# Patient Record
Sex: Female | Born: 1965
Health system: Southern US, Community
[De-identification: ages and names within clinical notes are randomized; demographics above are authoritative.]

## PROBLEM LIST (undated history)

## (undated) DIAGNOSIS — I10 Essential (primary) hypertension: Secondary | ICD-10-CM

## (undated) DIAGNOSIS — N1330 Unspecified hydronephrosis: Secondary | ICD-10-CM

## (undated) DIAGNOSIS — T7840XA Allergy, unspecified, initial encounter: Secondary | ICD-10-CM

## (undated) DIAGNOSIS — D700 Congenital agranulocytosis: Secondary | ICD-10-CM

## (undated) DIAGNOSIS — I77811 Abdominal aortic ectasia: Secondary | ICD-10-CM

## (undated) DIAGNOSIS — N189 Chronic kidney disease, unspecified: Secondary | ICD-10-CM

## (undated) DIAGNOSIS — J302 Other seasonal allergic rhinitis: Secondary | ICD-10-CM

## (undated) DIAGNOSIS — C801 Malignant (primary) neoplasm, unspecified: Secondary | ICD-10-CM

## (undated) DIAGNOSIS — Z973 Presence of spectacles and contact lenses: Secondary | ICD-10-CM

## (undated) DIAGNOSIS — E041 Nontoxic single thyroid nodule: Secondary | ICD-10-CM

## (undated) DIAGNOSIS — Z86018 Personal history of other benign neoplasm: Secondary | ICD-10-CM

## (undated) DIAGNOSIS — K760 Fatty (change of) liver, not elsewhere classified: Secondary | ICD-10-CM

## (undated) DIAGNOSIS — K824 Cholesterolosis of gallbladder: Secondary | ICD-10-CM

## (undated) DIAGNOSIS — E611 Iron deficiency: Secondary | ICD-10-CM

## (undated) DIAGNOSIS — N183 Chronic kidney disease, stage 3 unspecified: Secondary | ICD-10-CM

## (undated) DIAGNOSIS — D259 Leiomyoma of uterus, unspecified: Secondary | ICD-10-CM

## (undated) DIAGNOSIS — C4499 Other specified malignant neoplasm of skin, unspecified: Secondary | ICD-10-CM

## (undated) HISTORY — DX: Cholesterolosis of gallbladder: K82.4

## (undated) HISTORY — DX: Fatty (change of) liver, not elsewhere classified: K76.0

## (undated) HISTORY — PX: ABDOMINAL HYSTERECTOMY: SHX81

## (undated) HISTORY — DX: Essential (primary) hypertension: I10

## (undated) HISTORY — PX: MASS EXCISION: SHX2000

## (undated) HISTORY — DX: Allergy, unspecified, initial encounter: T78.40XA

## (undated) HISTORY — DX: Leiomyoma of uterus, unspecified: D25.9

## (undated) HISTORY — DX: Other seasonal allergic rhinitis: J30.2

## (undated) HISTORY — DX: Iron deficiency: E61.1

## (undated) HISTORY — DX: Other specified malignant neoplasm of skin, unspecified: C44.99

## (undated) HISTORY — DX: Abdominal aortic ectasia: I77.811

---

## 1995-05-05 HISTORY — PX: TUBAL LIGATION: SHX77

## 1998-05-20 ENCOUNTER — Other Ambulatory Visit: Admission: RE | Admit: 1998-05-20 | Discharge: 1998-05-20 | Payer: Self-pay | Admitting: Gynecology

## 2000-01-12 ENCOUNTER — Other Ambulatory Visit: Admission: RE | Admit: 2000-01-12 | Discharge: 2000-01-12 | Payer: Self-pay | Admitting: Obstetrics and Gynecology

## 2000-11-01 ENCOUNTER — Encounter: Payer: Self-pay | Admitting: Family Medicine

## 2000-11-01 ENCOUNTER — Ambulatory Visit (HOSPITAL_COMMUNITY): Admission: RE | Admit: 2000-11-01 | Discharge: 2000-11-01 | Payer: Self-pay | Admitting: Family Medicine

## 2001-10-28 ENCOUNTER — Other Ambulatory Visit: Admission: RE | Admit: 2001-10-28 | Discharge: 2001-10-28 | Payer: Self-pay | Admitting: Obstetrics and Gynecology

## 2004-10-20 ENCOUNTER — Other Ambulatory Visit: Admission: RE | Admit: 2004-10-20 | Discharge: 2004-10-20 | Payer: Self-pay | Admitting: Obstetrics and Gynecology

## 2007-01-28 ENCOUNTER — Ambulatory Visit (HOSPITAL_COMMUNITY): Admission: RE | Admit: 2007-01-28 | Discharge: 2007-01-28 | Payer: Self-pay | Admitting: Obstetrics and Gynecology

## 2007-01-28 ENCOUNTER — Encounter (INDEPENDENT_AMBULATORY_CARE_PROVIDER_SITE_OTHER): Payer: Self-pay | Admitting: Obstetrics and Gynecology

## 2007-01-28 HISTORY — PX: HYSTEROSCOPY WITH THERMACHOICE: SHX5396

## 2008-05-04 HISTORY — PX: ENDOMETRIAL ABLATION: SHX621

## 2009-03-04 LAB — CONVERTED CEMR LAB

## 2009-10-18 DIAGNOSIS — N840 Polyp of corpus uteri: Secondary | ICD-10-CM | POA: Insufficient documentation

## 2009-10-18 DIAGNOSIS — D649 Anemia, unspecified: Secondary | ICD-10-CM | POA: Insufficient documentation

## 2009-10-18 DIAGNOSIS — Z90710 Acquired absence of both cervix and uterus: Secondary | ICD-10-CM | POA: Insufficient documentation

## 2010-05-13 ENCOUNTER — Encounter: Payer: Self-pay | Admitting: Internal Medicine

## 2010-05-13 ENCOUNTER — Ambulatory Visit: Admission: RE | Admit: 2010-05-13 | Payer: Self-pay | Source: Home / Self Care | Admitting: Internal Medicine

## 2010-06-05 NOTE — Assessment & Plan Note (Signed)
Summary: new bcbs pt--#--pkg--stc   Vital Signs:  Patient profile:   45 year old female Height:      66 inches (167.64 cm) Weight:      198.0 pounds (90.00 kg) BMI:     32.07 O2 Sat:      99 % on Room air Temp:     98.4 degrees F (36.89 degrees C) oral Pulse rate:   71 / minute BP sitting:   134 / 98  (left arm) Cuff size:   large  Vitals Entered By: Orlan Leavens RMA (May 13, 2010 8:22 AM)  O2 Flow:  Room air CC: New patient Is Patient Diabetic? No Pain Assessment Patient in pain? no        Primary Care Provider:  Newt Lukes MD  CC:  New patient.  History of Present Illness: pt left before being seen (doctor in meeting)  Preventive Screening-Counseling & Management  Alcohol-Tobacco     Smoking Status: never  Current Medications (verified): 1)  None  Allergies (verified): No Known Drug Allergies  Social History: Never Smoked Smoking Status:  never   Other Orders: No Charge Patient Arrived (NCPA0) (NCPA0)   Orders Added: 1)  No Charge Patient Arrived (NCPA0) [NCPA0]     Mammogram  Procedure date:  03/04/2009  Findings:      No specific mammographic evidence of malignancy.    Pap Smear  Procedure date:  03/04/2009  Findings:      Interpretation Result:Negative for intraepithelial Lesion or Malignancy.

## 2010-08-15 ENCOUNTER — Other Ambulatory Visit: Payer: Self-pay | Admitting: Obstetrics and Gynecology

## 2010-08-15 DIAGNOSIS — R928 Other abnormal and inconclusive findings on diagnostic imaging of breast: Secondary | ICD-10-CM

## 2010-08-22 ENCOUNTER — Ambulatory Visit
Admission: RE | Admit: 2010-08-22 | Discharge: 2010-08-22 | Disposition: A | Discharge status: Home / Self Care | Payer: Federal, State, Local not specified - PPO | Source: Ambulatory Visit | Attending: Obstetrics and Gynecology | Admitting: Obstetrics and Gynecology

## 2010-08-22 DIAGNOSIS — R928 Other abnormal and inconclusive findings on diagnostic imaging of breast: Secondary | ICD-10-CM

## 2010-09-16 NOTE — H&P (Signed)
[name], [name]               ACCOUNT NO.:  [id number]   MEDICAL RECORD NO.:  [id number]          PATIENT TYPE:  AMB   LOCATION:  SDC                           FACILITY:  [facility]   PHYSICIAN:  Duke Salvia. Marcelle Overlie, M.D.DATE OF BIRTH:  1965-10-12   DATE OF ADMISSION:  DATE OF DISCHARGE:                              HISTORY & PHYSICAL   CHIEF COMPLAINT:  Menorrhagia with anemia.   HISTORY OF PRESENT ILLNESS:  A 45 year old G4 P4 prior tubal with a  several year history of anemia and menorrhagia.  Recent SHG in our  office showed multiple fibroids intramural 2.1, 1.2 and 3, and on saline  infusion she had a significant polyp noted in the endometrial cavity.  She presents now for [facility] hysteroscopy with NovaSure EMA.  This procedure  including risk of bleeding, infection, other complications that may  require open or additional surgery were reviewed.  The 90%  amenorrhea/hypomenorrhea rate status post NovaSure reviewed with her  along with her expected recovery time.   CURRENT MEDICATIONS:  OCP for bleeding suppression along with iron  daily.   PAST MEDICAL HISTORY:   ALLERGIES:  None.   OPERATION:  Tubal.   FAMILY HISTORY:  Significant for mother and father with diabetes and  hypertension.   PHYSICAL EXAMINATION:  VITAL SIGNS:  Temperature 98.2, blood pressure  120/78.  HEENT: Unremarkable.  NECK:  Supple without masses.  Lungs: Clear.  CARDIOVASCULAR: Regular rate and rhythm without murmurs, rubs, gallops.  BREASTS: Without masses.  ABDOMEN:  Soft, flat and nontender.  PELVIC:  Normal external genitalia. Vagina and cervix clear.  Uterus  midposition, upper limits normal size.  Adnexa negative.  EXTREMITIES/NEUROLOGIC:  Unremarkable.   IMPRESSION:  1. Menorrhagia.  2. Small leiomyoma.  3. Endometrial polyp.   PLAN:  D&C hysteroscopy with NovaSure EMA.  Procedure and risks reviewed  as above.      Richard M. Marcelle Overlie, M.D.  Electronically Signed     RMH/MEDQ  D:   01/25/2007  T:  01/25/2007  Job:  [id number]

## 2010-09-16 NOTE — Op Note (Signed)
[name], [name]               ACCOUNT NO.:  [id number]   MEDICAL RECORD NO.:  [id number]          PATIENT TYPE:  AMB   LOCATION:  SDC                           [facility]ILITY:  [facility]   PHYSICIAN:  Duke Salvia. Marcelle Overlie, M.D.DATE OF BIRTH:  12/03/1965   DATE OF PROCEDURE:  01/28/2007  DATE OF DISCHARGE:                               OPERATIVE REPORT   PREOPERATIVE DIAGNOSES:  1. Menorrhagia with anemia.  2. Leiomyoma.  3. Endometrial polyp.   POSTOPERATIVE DIAGNOSES:  1. Menorrhagia with anemia.  2. Leiomyoma.  3. Endometrial polyp.   PROCEDURE:  Diagnostic hysteroscopy with removal of endometrial polyp,  attempted NovaSure endometrial ablation followed by ThermaChoice  endometrial ablation.   SURGEON:  Duke Salvia. Marcelle Overlie, M.D.   ANESTHESIA:  General tracheal.   COMPLICATIONS:  None.   DRAINS:  Foley catheter.   BLOOD LOSS:  Minimal.   SPECIMENS REMOVED:  Endometrial curettings, plus endometrial polyp.   PROCEDURE AND FINDINGS:  The patient was taken to the operating room.  After an adequate level of general endotracheal anesthesia was obtained  with the patient legs in stirrups, perineum and vagina were prepped and  draped in usual manner for vaginal procedures.  In-and-out catheter used  to drain the bladder.  EUA carried out at that point.  Uterus was 10-11  weeks size, symmetrically enlarged.  Speculum was positioned.  Cervix  grasped with tenaculum.  Paracervical block created by infiltrating at 3  and 9 o'clock submucosally, 5-7 mL of 1% Xylocaine on either side after  negative aspiration for mainly postoperative analgesia.  The uterus was  then sounded to 10-11 cm, progressively dilated to a 29 Pratt.  The 7-mm  continuous flow hysteroscope was then inserted.  A well-defined  endometrial polyp was noted at the cavity.  Polyp forceps was used to  explore the cavity revealing a large portion of polyp.  Sharp curettage  was then carried out, all submitted to pathology.   The scope was  reinserted.  The cavity was irrigated.  The polyp was noted to be gone.  The uterus sounded to 10 cm on recheck with a cervical length of 3.0.  The NovaSure procedure was started at that point.  I could not get the  array to engage primarily due to the width and the fact that there was  some irregularity to the cavity at the fundus.  Two attempts were made,  could not get the array to engaged properly.  The NovaSure was  terminated at that point.  Repeat hysteroscopy revealed the cavity to be  unremarkable.  There was no evidence of perforation.  The ThermaChoice  was then positioned.  Pressure  stabilized at approximately 170, and the 8-minute treatment cycle was  carried out without difficulty.  She tolerated this well.  Instruments  were removed.  She went to recovery room in good condition.  She did  receive preoperative Ancef 1 gram IV and during the case received IV  Toradol.      Richard M. Marcelle Overlie, M.D.  Electronically Signed     RMH/MEDQ  D:  01/28/2007  T:  01/28/2007  Job:  [id number]

## 2011-02-12 LAB — CBC
Hemoglobin: 10.4 — ABNORMAL LOW
Platelets: 322
RDW: 23.1 — ABNORMAL HIGH

## 2011-03-05 ENCOUNTER — Encounter: Payer: Self-pay | Admitting: Family Medicine

## 2011-03-05 ENCOUNTER — Ambulatory Visit (INDEPENDENT_AMBULATORY_CARE_PROVIDER_SITE_OTHER): Payer: Federal, State, Local not specified - PPO | Admitting: Family Medicine

## 2011-03-05 VITALS — BP 126/82 | HR 72 | Temp 99.1°F | Ht 66.0 in | Wt 199.0 lb

## 2011-03-05 DIAGNOSIS — Z833 Family history of diabetes mellitus: Secondary | ICD-10-CM

## 2011-03-05 DIAGNOSIS — R209 Unspecified disturbances of skin sensation: Secondary | ICD-10-CM

## 2011-03-05 DIAGNOSIS — D259 Leiomyoma of uterus, unspecified: Secondary | ICD-10-CM

## 2011-03-05 DIAGNOSIS — E669 Obesity, unspecified: Secondary | ICD-10-CM

## 2011-03-05 DIAGNOSIS — D649 Anemia, unspecified: Secondary | ICD-10-CM

## 2011-03-05 DIAGNOSIS — R200 Anesthesia of skin: Secondary | ICD-10-CM | POA: Insufficient documentation

## 2011-03-05 NOTE — Progress Notes (Signed)
Subjective:    Patient ID: [name], female    DOB: [birth year], 45 y.o.   MRN: [id number]  HPI CC: new pt  Presents with husband.  Having trouble with circulation, worse at night.  Even when sitting starts noticing fingers going numb.  This has been going on for 1 wk, has happened in past.  All tips of fingers.  Normally lays on left side.  No pain in arms.  No burning or tingling, just mild numbness.  This wakes her up.  Noticing slight headache.  Also thinks may have low iron.  Takes one daily, in am prior to breakfast.  H/o low iron.  Started on iron by OBGYN.  Low iron runs in family.  Preventative: Well woman at [facility] - early this year. Normal mammo, pap smear this year. No recent cholesterol/sugar check. Unsure tetanus.  Declines flu.  Medications and allergies reviewed and updated in chart.  Past histories reviewed and updated if relevant as below. Patient Active Problem List  Diagnoses  . LEIOMYOMA, UTERUS  . ANEMIA-NOS  . ENDOMETRIAL POLYP   Past Medical History  Diagnosis Date  . Iron deficiency   . Pre-hypertension   . Seasonal allergies    Past Surgical History  Procedure Date  . Tubal ligation 1997  . Endometrial ablation 2010    fibroids   History  Substance Use Topics  . Smoking status: Never Smoker   . Smokeless tobacco: Never Used  . Alcohol Use: No   Family History  Problem Relation Age of Onset  . Diabetes Mother   . Hypertension Mother   . Diabetes Father   . Hypertension Father   . Cancer Paternal Aunt 59  . Stroke Neg Hx   . Coronary artery disease Neg Hx    No Known Allergies No current outpatient prescriptions on file prior to visit.   Review of Systems  Constitutional: Negative for fever, chills, activity change, appetite change, fatigue and unexpected weight change.  HENT: Negative for hearing loss and neck pain.   Eyes: Negative for visual disturbance.  Respiratory: Negative for cough, chest tightness, shortness of breath  and wheezing.   Cardiovascular: Positive for chest pain (dull ache on right side, not pressure/tightness, not exhertional or relieved by rest). Negative for palpitations and leg swelling.  Gastrointestinal: Negative for nausea, vomiting, abdominal pain, diarrhea, constipation, blood in stool and abdominal distention.  Genitourinary: Negative for hematuria and difficulty urinating.  Musculoskeletal: Negative for myalgias and arthralgias.  Skin: Negative for rash.  Neurological: Positive for headaches (occasional). Negative for dizziness, seizures and syncope.  Hematological: Does not bruise/bleed easily.  Psychiatric/Behavioral: Negative for dysphoric mood. The patient is not nervous/anxious.       Objective:   Physical Exam  Nursing note and vitals reviewed. Constitutional: She is oriented to person, place, and time. She appears well-developed and well-nourished. No distress.  HENT:  Head: Normocephalic and atraumatic.  Right Ear: Hearing, tympanic membrane, external ear and ear canal normal.  Left Ear: Hearing, tympanic membrane, external ear and ear canal normal.  Nose: Nose normal. No mucosal edema or rhinorrhea.  Mouth/Throat: Uvula is midline, oropharynx is clear and moist and mucous membranes are normal. No oropharyngeal exudate, posterior oropharyngeal edema, posterior oropharyngeal erythema or tonsillar abscesses.  Eyes: Conjunctivae and EOM are normal. Pupils are equal, round, and reactive to light. No scleral icterus.  Neck: Normal range of motion. Neck supple. Carotid bruit is not present. No thyromegaly present.  Cardiovascular: Normal rate, regular rhythm, normal  heart sounds and intact distal pulses.   No murmur heard. Pulses:      Radial pulses are 2+ on the right side, and 2+ on the left side.  Pulmonary/Chest: Effort normal and breath sounds normal. No respiratory distress. She has no wheezes. She has no rales.  Abdominal: Soft. Bowel sounds are normal. She exhibits no  distension and no mass. There is no tenderness. There is no rebound and no guarding.  Musculoskeletal: Normal range of motion. She exhibits no edema.  Lymphadenopathy:    She has no cervical adenopathy.  Neurological: She is alert and oriented to person, place, and time. Coordination normal.       CN grossly intact, station and gait intact Temperature sensation intact, no numbness currently, no weakness, grip strength intact, intact 2 point discrimination and differentiation between hard/soft  Skin: Skin is warm and dry. No rash noted.  Psychiatric: She has a normal mood and affect. Her behavior is normal. Judgment and thought content normal.      Assessment & Plan:

## 2011-03-05 NOTE — Assessment & Plan Note (Signed)
S/p ablation, no problems since.

## 2011-03-05 NOTE — Patient Instructions (Addendum)
Return at your convenience fasting for blood work. For circulation - we will check blood work. We will check sugar, cholesterol, as well as vitamin levels and iron. Good to meet you today.  Call us with questions.

## 2011-03-05 NOTE — Assessment & Plan Note (Signed)
Tips of fingers.  Not consistent with CTS or with cervical myelopathy. Doubt circulation issue - no LE sxs, good pulses on exam today. ?compression neuropathy from sleeping at night. Check blood work to r/o deficiency as cause of this. Update if worsening.

## 2011-03-05 NOTE — Assessment & Plan Note (Signed)
Check CBC and iron panel when returns.

## 2011-03-06 ENCOUNTER — Other Ambulatory Visit (INDEPENDENT_AMBULATORY_CARE_PROVIDER_SITE_OTHER): Payer: Federal, State, Local not specified - PPO

## 2011-03-06 DIAGNOSIS — E669 Obesity, unspecified: Secondary | ICD-10-CM

## 2011-03-06 DIAGNOSIS — R200 Anesthesia of skin: Secondary | ICD-10-CM

## 2011-03-06 DIAGNOSIS — Z833 Family history of diabetes mellitus: Secondary | ICD-10-CM

## 2011-03-06 DIAGNOSIS — R209 Unspecified disturbances of skin sensation: Secondary | ICD-10-CM

## 2011-03-06 DIAGNOSIS — D649 Anemia, unspecified: Secondary | ICD-10-CM

## 2011-03-06 LAB — COMPREHENSIVE METABOLIC PANEL
AST: 23 U/L (ref 0–37)
Albumin: 4.3 g/dL (ref 3.5–5.2)
Alkaline Phosphatase: 38 U/L — ABNORMAL LOW (ref 39–117)
Glucose, Bld: 88 mg/dL (ref 70–99)
Potassium: 3.6 mEq/L (ref 3.5–5.1)
Sodium: 138 mEq/L (ref 135–145)
Total Protein: 7.8 g/dL (ref 6.0–8.3)

## 2011-03-06 LAB — LIPID PANEL
HDL: 61.5 mg/dL (ref >39.00)
LDL Cholesterol: 99 mg/dL (ref 0–99)
Total CHOL/HDL Ratio: 3
VLDL: 31.8 mg/dL (ref 0.0–40.0)

## 2011-03-06 LAB — CBC WITH DIFFERENTIAL/PLATELET
Eosinophils Absolute: 0.1 10*3/uL (ref 0.0–0.7)
Eosinophils Relative: 2.3 % (ref 0.0–5.0)
Lymphocytes Relative: 54.9 % — ABNORMAL HIGH (ref 12.0–46.0)
MCV: 91.2 fl (ref 78.0–100.0)
Monocytes Absolute: 0.3 10*3/uL (ref 0.1–1.0)
Neutrophils Relative %: 32.2 % — ABNORMAL LOW (ref 43.0–77.0)
Platelets: 238 10*3/uL (ref 150.0–400.0)
WBC: 3.5 10*3/uL — ABNORMAL LOW (ref 4.5–10.5)

## 2011-03-06 LAB — IBC PANEL
Iron: 92 ug/dL (ref 42–145)
Saturation Ratios: 24.8 % (ref 20.0–50.0)
Transferrin: 265.3 mg/dL (ref 212.0–360.0)

## 2011-03-06 LAB — FERRITIN: Ferritin: 31.9 ng/mL (ref 10.0–291.0)

## 2011-03-07 LAB — VITAMIN D 25 HYDROXY (VIT D DEFICIENCY, FRACTURES): Vit D, 25-Hydroxy: 14 ng/mL — ABNORMAL LOW (ref 30–89)

## 2011-03-08 ENCOUNTER — Telehealth: Payer: Self-pay | Admitting: Family Medicine

## 2011-03-08 MED ORDER — VITAMIN D (ERGOCALCIFEROL) 1.25 MG (50000 UNIT) PO CAPS: 50000.0000 [IU] | ORAL_CAPSULE | ORAL | Status: DC

## 2011-03-08 NOTE — Telephone Encounter (Signed)
Please notify vitamin B12, folate, iron, thyroid normal.  Kidneys, liver, sugar normal. Vitamin D was low- recommend weekly supplementation 50,000 u once weekly (Sent in) for 8 weeks then 1000IU daily. Cholesterol levels overall good. Not anemic. White count was a bit low - would want to monitor this with recheck in 3-4 months.

## 2011-03-09 NOTE — Telephone Encounter (Signed)
Message left for patient to return my call.  

## 2011-03-10 NOTE — Telephone Encounter (Signed)
Patient notified. She will start vitamin d. Repeat labs scheduled.

## 2011-04-10 ENCOUNTER — Ambulatory Visit (INDEPENDENT_AMBULATORY_CARE_PROVIDER_SITE_OTHER): Payer: Federal, State, Local not specified - PPO | Admitting: Internal Medicine

## 2011-04-10 ENCOUNTER — Encounter: Payer: Self-pay | Admitting: Internal Medicine

## 2011-04-10 VITALS — BP 166/96 | HR 86 | Temp 98.5°F | Wt 203.0 lb

## 2011-04-10 DIAGNOSIS — H6691 Otitis media, unspecified, right ear: Secondary | ICD-10-CM

## 2011-04-10 DIAGNOSIS — H6690 Otitis media, unspecified, unspecified ear: Secondary | ICD-10-CM

## 2011-04-10 MED ORDER — AMOXICILLIN 500 MG PO TABS: 1000.0000 mg | ORAL_TABLET | Freq: Two times a day (BID) | ORAL | Status: AC

## 2011-04-10 MED ORDER — ANTIPYRINE-BENZOCAINE 5.4-1.4 % OT SOLN: 3.0000 [drp] | OTIC | Status: AC | PRN

## 2011-04-10 NOTE — Assessment & Plan Note (Signed)
Mild Could be related to sinus issues Not clearly bacterial but she is anxious for Rx with worsening status Will give amoxil and auralgan for pain

## 2011-04-10 NOTE — Progress Notes (Signed)
  Subjective:    Patient ID: [name], female    DOB: [birth year], 45 y.o.   MRN: [id number]  HPI Having pressure and aching in right ear since last night Has had congestion all week Feels sick No fever Lots of drainage with some cough---occ brings up greenish mucus No SOB No sore throat  Tried alka seltzer plus Zyrtec and robitussin No clear help  No current outpatient prescriptions on file prior to visit.    No Known Allergies  Past Medical History  Diagnosis Date  . Iron deficiency   . Pre-hypertension   . Seasonal allergies     Past Surgical History  Procedure Date  . Tubal ligation 1997  . Endometrial ablation 2010    fibroids    Family History  Problem Relation Age of Onset  . Diabetes Mother   . Hypertension Mother   . Diabetes Father   . Hypertension Father   . Cancer Paternal Aunt 54  . Stroke Neg Hx   . Coronary artery disease Neg Hx     History   Social History  . Marital Status: Married    Spouse Name: N/A    Number of Children: N/A  . Years of Education: N/A   Occupational History  . Not on file.   Social History Main Topics  . Smoking status: Never Smoker   . Smokeless tobacco: Never Used  . Alcohol Use: No  . Drug Use: No  . Sexually Active: Not on file   Other Topics Concern  . Not on file   Social History Narrative   Caffeine: 1 cup coffee/dayLives with husband, 3 sonsOccupation: works for [employer]: works out 3x/wk at [employer]: high sugar, good fruits and vegetables, some water   Review of Systems No rash No vomiting or diarrhea Eating okay Has seasonal allergies--not usually this time of year    Objective:   Physical Exam  Constitutional: She appears well-developed and well-nourished. No distress.  HENT:  Mouth/Throat: Oropharynx is clear and moist. No oropharyngeal exudate.       No sinus tenderness Moderate nasal inflammation Right TM has superior inflammation --mild Left TM pretty normal  Neck: Normal  range of motion. Neck supple.  Pulmonary/Chest: Effort normal and breath sounds normal. No respiratory distress. She has no wheezes. She has no rales.  Lymphadenopathy:    She has no cervical adenopathy.          Assessment & Plan:

## 2011-06-02 ENCOUNTER — Other Ambulatory Visit: Payer: Self-pay | Admitting: Family Medicine

## 2011-06-02 DIAGNOSIS — R7989 Other specified abnormal findings of blood chemistry: Secondary | ICD-10-CM

## 2011-06-09 ENCOUNTER — Other Ambulatory Visit: Payer: Federal, State, Local not specified - PPO

## 2011-06-15 ENCOUNTER — Other Ambulatory Visit: Payer: Federal, State, Local not specified - PPO

## 2012-05-04 DIAGNOSIS — C4499 Other specified malignant neoplasm of skin, unspecified: Secondary | ICD-10-CM

## 2012-05-04 HISTORY — DX: Other specified malignant neoplasm of skin, unspecified: C44.99

## 2012-05-31 ENCOUNTER — Encounter (INDEPENDENT_AMBULATORY_CARE_PROVIDER_SITE_OTHER): Payer: Self-pay | Admitting: Surgery

## 2012-05-31 ENCOUNTER — Ambulatory Visit (INDEPENDENT_AMBULATORY_CARE_PROVIDER_SITE_OTHER): Payer: Federal, State, Local not specified - PPO | Admitting: Surgery

## 2012-05-31 VITALS — BP 132/72 | HR 87 | Temp 98.2°F | Resp 18 | Ht 66.0 in | Wt 212.0 lb

## 2012-05-31 DIAGNOSIS — D492 Neoplasm of unspecified behavior of bone, soft tissue, and skin: Secondary | ICD-10-CM

## 2012-05-31 NOTE — Patient Instructions (Signed)
  [facility] -- DISCHARGE INSTRUCTIONS  REMINDER:   Carry a list of your medications and allergies with you at all times  Call your pharmacy at least 1 week in advance to refill prescriptions  Do not mix any prescribed pain medicine with alcohol  Do not drive any motor vehicles while taking pain medication  Take medications with food unless otherwise directed  Follow-up appointments (date to return to physician): Please call [phone] to confirm your follow up appointment with your surgeon.  Call your Surgeon if you have:  Temperature greater than 101.0  Persistent nausea and vomiting  Severe uncontrolled pain  Redness, tenderness, or signs of infection (pain, swelling, redness, odor or    green/yellow discharge around the site)  Difficulty breathing, headache or visual disturbances  Hives  Persistent dizziness or light-headedness  Any other questions or concerns you may have after discharge  In an emergency, call 911 or go to an Emergency Department at a nearby hospital.  Diet: Begin with liquids, and if they are tolerated, resume your usual diet.  Avoid spicy, greasy or heavy foods.  If you have nausea or vomiting, go back to liquids.  If you cannot keep liquids down, call your doctor.  Avoid alcohol consumption while on prescription pain medications. Good nutrition promotes healing. Increase fiber and fluids.   ADDITIONAL INSTRUCTIONS:   Zyeir Dymek M. Celia Friedland, MD, FACS [facility] Office: [phone] 

## 2012-05-31 NOTE — Progress Notes (Signed)
General Surgery [facility]  Chief Complaint  Patient presents with  . New Evaluation    evaluate keloid - referral from Dr. June Leap    HISTORY: The patient is a 47 year old black female who presents on referral from her dermatologist with an enlarging soft tissue mass on the pubis. This has been present for several months. Over the past 2 months there has been mark and enlargement. Patient was concern that this represented a keloid. She denies any signs or symptoms of infection. She denies any previous surgical incisions in this region. She does note mild to moderate discomfort. She presents today for evaluation for surgical excision.  Past Medical History  Diagnosis Date  . Iron deficiency   . Pre-hypertension   . Seasonal allergies      No current outpatient prescriptions on file.     No Known Allergies   Family History  Problem Relation Age of Onset  . Diabetes Mother   . Hypertension Mother   . Diabetes Father   . Hypertension Father   . Cancer Paternal Aunt 66  . Stroke Neg Hx   . Coronary artery disease Neg Hx      History   Social History  . Marital Status: Married    Spouse Name: N/A    Number of Children: N/A  . Years of Education: N/A   Social History Main Topics  . Smoking status: Never Smoker   . Smokeless tobacco: Never Used  . Alcohol Use: No  . Drug Use: No  . Sexually Active: None   Other Topics Concern  . None   Social History Narrative   Caffeine: 1 cup coffee/dayLives with husband, 3 sonsOccupation: works for [employer]: works out 3x/wk at [employer]: high sugar, good fruits and vegetables, some water     REVIEW OF SYSTEMS - PERTINENT POSITIVES ONLY: Denies any signs or symptoms of infection. No history of drainage. No history of previous wound or surgical incision. No areas of hidradenitis in the groin, axillae, or beneath the breast.  EXAM: Filed Vitals:   05/31/12 1409  BP: 132/72  Pulse: 87    Temp: 98.2 F (36.8 C)  Resp: 18    HEENT: normocephalic; pupils equal and reactive; sclerae clear; dentition good; mucous membranes moist NECK:  symmetric on extension; no palpable anterior or posterior cervical lymphadenopathy; no supraclavicular masses; no tenderness CHEST: clear to auscultation bilaterally without rales, rhonchi, or wheezes CARDIAC: regular rate and rhythm without significant murmur; peripheral pulses are full ABDOMEN: soft without distension; bowel sounds present; no mass; no hepatosplenomegaly; no hernia GU:  Large protruding soft tissue mass with multiple lobules extending over an area measuring 9 x 6 x 5 cm centered on the upper mons pubis. No ulceration. Mild tenderness. No fluctuance. EXT:  non-tender without edema; no deformity NEURO: no gross focal deficits; no sign of tremor   LABORATORY RESULTS: See [facility] (CHL-Epic) for most recent results   RADIOLOGY RESULTS: See [facility] (CHL-Epic) for most recent results   IMPRESSION: Soft tissue neoplasm involving skin and subcutaneous tissues on mons pubis, 9 x 6 x 5 cm  PLAN: I discussed the findings with the patient and her husband. This may represent keloid. However I am concerned about the possibility of a soft tissue neoplasm given the rapid growth. This could also represent a very at of hidradenitis with inflammatory changes.  I think the safest and most expeditious course of action would be surgical excision. We can perform this as  an outpatient procedure. We will close the wound in layers. We discussed the extent of the surgical incision. We will send the tissue for pathologic diagnosis. If this is a keloid, I discussed the risk of recurrence. Patient and her husband understand wished to proceed.  The risks and benefits of the procedure have been discussed at length with the patient.  The patient understands the proposed procedure, potential alternative treatments, and the course of  recovery to be expected.  All of the patient's questions have been answered at this time.  The patient wishes to proceed with surgery.  Velora Heckler, MD, FACS General & Endocrine Surgery [facility]   Visit Diagnoses: 1. Neoplasm of soft tissue, pubis, skin and subcutaneous tissue     Primary Care Physician: Eustaquio Boyden, MD

## 2012-06-04 DIAGNOSIS — Z9889 Other specified postprocedural states: Secondary | ICD-10-CM

## 2012-06-04 HISTORY — DX: Other specified postprocedural states: Z98.890

## 2012-06-10 ENCOUNTER — Other Ambulatory Visit (INDEPENDENT_AMBULATORY_CARE_PROVIDER_SITE_OTHER): Payer: Self-pay | Admitting: Surgery

## 2012-06-10 DIAGNOSIS — C44509 Unspecified malignant neoplasm of skin of other part of trunk: Secondary | ICD-10-CM

## 2012-06-10 DIAGNOSIS — C44599 Other specified malignant neoplasm of skin of other part of trunk: Secondary | ICD-10-CM

## 2012-06-10 HISTORY — PX: SOFT TISSUE MASS EXCISION: SHX2419

## 2012-06-13 ENCOUNTER — Telehealth (INDEPENDENT_AMBULATORY_CARE_PROVIDER_SITE_OTHER): Payer: Self-pay

## 2012-06-13 NOTE — Telephone Encounter (Signed)
LMOM for pt to call and confirm appt

## 2012-06-13 NOTE — Telephone Encounter (Signed)
The pt called back and confirmed the appointment.  She has stitches.  I gave her the appointment after confirming with [facility].  She also states she has a little bit of blood.  I told her that is ok and to keep it covered with gauze or pad.

## 2012-06-21 ENCOUNTER — Ambulatory Visit (INDEPENDENT_AMBULATORY_CARE_PROVIDER_SITE_OTHER): Payer: Federal, State, Local not specified - PPO | Admitting: Surgery

## 2012-06-21 ENCOUNTER — Encounter (INDEPENDENT_AMBULATORY_CARE_PROVIDER_SITE_OTHER): Payer: Self-pay

## 2012-06-21 ENCOUNTER — Encounter (INDEPENDENT_AMBULATORY_CARE_PROVIDER_SITE_OTHER): Payer: Self-pay | Admitting: Surgery

## 2012-06-21 VITALS — BP 154/92 | HR 84 | Temp 97.7°F | Resp 16 | Ht 66.0 in | Wt 210.2 lb

## 2012-06-21 DIAGNOSIS — D492 Neoplasm of unspecified behavior of bone, soft tissue, and skin: Secondary | ICD-10-CM

## 2012-06-21 DIAGNOSIS — C44599 Other specified malignant neoplasm of skin of other part of trunk: Secondary | ICD-10-CM

## 2012-06-21 NOTE — Patient Instructions (Signed)
Antibiotic ointment to wound at central part as demonstrated in office 2-3 times daily.  Cover wound with dry gauze.  May shower.  Velora Heckler, MD, [facility] Office: [phone]

## 2012-06-21 NOTE — Progress Notes (Signed)
General Surgery [facility]  Visit Diagnoses: 1. Neoplasm of soft tissue, pubis, skin and subcutaneous tissue   2. Dermatofibrosarcoma protuberans of pubic region     HISTORY: The patient is a 47 year old black female who returns for her first postoperative visit having undergone excision of a soft tissue mass from the skin and subcutaneous tissues of the pubis and lower abdominal wall. Final pathology shows a dermatofibrosarcoma protuberans with positive surgical margins.  EXAM: Surgical wound is healing nicely. Half of the nylon sutures are removed today. At the central portion of the wound is a 1 cm in diameter moist eschar which is dressed with antibiotic ointment and dry gauze.  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 Patient was also seen today by my partner, Dr. Almond Lint, from surgical oncology.  We will discuss this case with the patient's dermatologist and provide her with options for further management. Given the fact that there are positive surgical margins, she will need further surgical resection in order to help prevent local recurrence. These tumors are very likely to recur locally, even with wide local excision. Adjuvant radiation therapy is sometimes employed with limited success. I discussed these factors today with the patient and her husband. I provided them with a written copy of the pathology results and also an article on this type of tumor from the surgical literature.  Patient will return in one week for removal of the remaining sutures.  Velora Heckler, MD, FACS General & Endocrine Surgery [facility]

## 2012-06-24 ENCOUNTER — Telehealth (INDEPENDENT_AMBULATORY_CARE_PROVIDER_SITE_OTHER): Payer: Self-pay | Admitting: General Surgery

## 2012-06-24 NOTE — Telephone Encounter (Signed)
Pt called to ask if possible to numb the area where additional sutures are to be removed, because it was so painful before.  Suggested she could let the nurse know how painful it was before and if it is appropriate to try applying some ice to the site to numb it.  She will consider this.

## 2012-06-27 ENCOUNTER — Encounter (INDEPENDENT_AMBULATORY_CARE_PROVIDER_SITE_OTHER): Payer: Self-pay | Admitting: Surgery

## 2012-06-27 ENCOUNTER — Telehealth (INDEPENDENT_AMBULATORY_CARE_PROVIDER_SITE_OTHER): Payer: Self-pay

## 2012-06-27 ENCOUNTER — Ambulatory Visit (INDEPENDENT_AMBULATORY_CARE_PROVIDER_SITE_OTHER): Payer: Federal, State, Local not specified - PPO | Admitting: Surgery

## 2012-06-27 VITALS — BP 148/100 | HR 80 | Temp 97.6°F | Resp 16 | Ht 66.0 in | Wt 207.2 lb

## 2012-06-27 DIAGNOSIS — C44599 Other specified malignant neoplasm of skin of other part of trunk: Secondary | ICD-10-CM

## 2012-06-27 DIAGNOSIS — D492 Neoplasm of unspecified behavior of bone, soft tissue, and skin: Secondary | ICD-10-CM

## 2012-06-27 NOTE — Telephone Encounter (Signed)
Per Dr Gerrit Friends I called Dr Baldwin [place] office at [facility] dermatology dept and gave pt info for ref to his office. I have faxed records and Nitasha at is office is to call pt to set up appt.

## 2012-06-27 NOTE — Progress Notes (Signed)
General Surgery [facility]  Visit Diagnoses: 1. Dermatofibrosarcoma protuberans of pubic region   2. Neoplasm of soft tissue, pubis, skin and subcutaneous tissue     HISTORY: Patient returns for wound check having undergone excision of a dermatofibrosarcoma protuberans from the pubis. Patient has multiple positive microscopic margins. We discussed the next step in management.  EXAM: Surgical wound is healing nicely. Remaining sutures are removed. Small area of granulation tissue in the central portion is treated with topical antibiotic ointment. No drainage. No sign of infection. No sign of dehiscence.  IMPRESSION: Dermatofibromasarcoma protuberans, microscopic positive margins  PLAN: I discussed the pathology results and recommendations again with the patient and her husband. I have discussed her case with 2 of my partners. One of my partners has experience with the dermatologic surgeons at the [place] [facility]. We will obtain that referral information for the patient. We will schedule a consultation in the near future.  Patient will return for wound check in 3 weeks.  Velora Heckler, MD, FACS General & Endocrine Surgery [facility]

## 2012-06-27 NOTE — Addendum Note (Signed)
Addended by: Joanette Gula on: 06/27/2012 02:39 PM   Modules accepted: Orders

## 2012-06-27 NOTE — Patient Instructions (Signed)
Apply topical antibiotic ointment to open areas 2-3 times daily.  Shower once daily.  Velora Heckler, MD, [facility] Office: [phone]

## 2012-07-04 ENCOUNTER — Telehealth (INDEPENDENT_AMBULATORY_CARE_PROVIDER_SITE_OTHER): Payer: Self-pay

## 2012-07-04 NOTE — Telephone Encounter (Signed)
Message copied by Joanette Gula on Mon Jul 04, 2012  1:14 PM ------      Message from: Marin Shutter      Created: Mon Jul 04, 2012 12:48 PM      Regarding: Dr. Ree Shay [facility] is waiting for demographics/pathology report for this pt.  They need it to register her in the system. [phone] (phone)/[phone] (fax).  Thx ------

## 2012-07-04 NOTE — Telephone Encounter (Signed)
I received msg from Grady at [facility] that she did not receive path. I faxed path with records on 06-27-12. I did receive a confirmation showing all pages were received. I LMOM of concern for where path has gone and that I can refax but need her to track where the copy of path may have gone.

## 2012-07-13 ENCOUNTER — Telehealth (INDEPENDENT_AMBULATORY_CARE_PROVIDER_SITE_OTHER): Payer: Self-pay

## 2012-07-13 NOTE — Telephone Encounter (Signed)
I did receive call from [facility] at [facility] derm. They have located records and are making appt for pt.

## 2012-07-27 ENCOUNTER — Encounter (INDEPENDENT_AMBULATORY_CARE_PROVIDER_SITE_OTHER): Payer: Federal, State, Local not specified - PPO | Admitting: Surgery

## 2012-08-09 ENCOUNTER — Encounter (INDEPENDENT_AMBULATORY_CARE_PROVIDER_SITE_OTHER): Payer: Self-pay | Admitting: Surgery

## 2012-08-18 ENCOUNTER — Encounter (INDEPENDENT_AMBULATORY_CARE_PROVIDER_SITE_OTHER): Payer: Self-pay | Admitting: Surgery

## 2012-08-18 ENCOUNTER — Ambulatory Visit (INDEPENDENT_AMBULATORY_CARE_PROVIDER_SITE_OTHER): Payer: Federal, State, Local not specified - PPO | Admitting: Surgery

## 2012-08-18 VITALS — BP 142/88 | HR 80 | Temp 97.7°F | Resp 14 | Ht 66.0 in | Wt 207.0 lb

## 2012-08-18 DIAGNOSIS — C44599 Other specified malignant neoplasm of skin of other part of trunk: Secondary | ICD-10-CM

## 2012-08-18 NOTE — Progress Notes (Signed)
General Surgery [facility]  Visit Diagnoses: 1. Dermatofibrosarcoma protuberans of pubic region     HISTORY: Patient returns for wound check. Overall she is doing quite well. She is having no pain. She has not seen any further drainage. She is applying topical creams to her incision.  Patient was scheduled for consultation at [facility]. She had to cancel her initial appointment. She is awaiting a call back from the clinic at [facility] for her initial consultation.  EXAM: Surgical incision has now healed completely and has epithelialized. There is no sign of infection. Overall this appears to have healed very well.  IMPRESSION: Dermatofibroma protuberans  PLAN: Patient will schedule her consultation at [facility]. I have asked her to forward their recommendations to me for review.  Patient will return as needed.  Velora Heckler, MD, FACS General & Endocrine Surgery [facility]

## 2012-08-18 NOTE — Patient Instructions (Signed)
  COCOA BUTTER & VITAMIN E CREAM  ([employer]mer's or other brand)  Apply cocoa butter/vitamin E cream to your incision 2 - 3 times daily.  Massage cream into incision for one minute with each application.  Use sunscreen (50 SPF or higher) for first 6 months after surgery if area is exposed to sun.  You may substitute Mederma or other scar reducing creams as desired.   

## 2013-03-09 ENCOUNTER — Other Ambulatory Visit: Payer: Self-pay

## 2013-09-29 ENCOUNTER — Other Ambulatory Visit (INDEPENDENT_AMBULATORY_CARE_PROVIDER_SITE_OTHER): Payer: Federal, State, Local not specified - PPO

## 2013-09-29 ENCOUNTER — Other Ambulatory Visit: Payer: Self-pay | Admitting: Family Medicine

## 2013-09-29 DIAGNOSIS — E669 Obesity, unspecified: Secondary | ICD-10-CM

## 2013-09-29 DIAGNOSIS — E559 Vitamin D deficiency, unspecified: Secondary | ICD-10-CM | POA: Insufficient documentation

## 2013-09-29 DIAGNOSIS — D649 Anemia, unspecified: Secondary | ICD-10-CM

## 2013-09-29 LAB — CBC WITH DIFFERENTIAL/PLATELET
Basophils Absolute: 0 10*3/uL (ref 0.0–0.1)
Basophils Relative: 0.6 % (ref 0.0–3.0)
Eosinophils Absolute: 0.1 10*3/uL (ref 0.0–0.7)
Eosinophils Relative: 2.6 % (ref 0.0–5.0)
HEMATOCRIT: 42.1 % (ref 36.0–46.0)
Hemoglobin: 14.2 g/dL (ref 12.0–15.0)
LYMPHS ABS: 2.3 10*3/uL (ref 0.7–4.0)
MCHC: 33.8 g/dL (ref 30.0–36.0)
MCV: 90 fl (ref 78.0–100.0)
MONOS PCT: 8.3 % (ref 3.0–12.0)
Monocytes Absolute: 0.3 10*3/uL (ref 0.1–1.0)
Neutro Abs: 1.2 10*3/uL — ABNORMAL LOW (ref 1.4–7.7)
Neutrophils Relative %: 31.2 % — ABNORMAL LOW (ref 43.0–77.0)
PLATELETS: 239 10*3/uL (ref 150.0–400.0)
RBC: 4.68 Mil/uL (ref 3.87–5.11)
RDW: 12.5 % (ref 11.5–15.5)
WBC: 3.9 10*3/uL — AB (ref 4.0–10.5)

## 2013-09-29 LAB — BASIC METABOLIC PANEL
BUN: 11 mg/dL (ref 6–23)
CALCIUM: 9.4 mg/dL (ref 8.4–10.5)
CHLORIDE: 100 meq/L (ref 96–112)
CO2: 32 meq/L (ref 19–32)
Creatinine, Ser: 1 mg/dL (ref 0.4–1.2)
GFR: 76.86 mL/min (ref >60.00)
GLUCOSE: 88 mg/dL (ref 70–99)
Potassium: 3.8 mEq/L (ref 3.5–5.1)
SODIUM: 138 meq/L (ref 135–145)

## 2013-09-29 LAB — LIPID PANEL
CHOL/HDL RATIO: 4
CHOLESTEROL: 190 mg/dL (ref 0–200)
HDL: 50.2 mg/dL (ref >39.00)
LDL CALC: 109 mg/dL — AB (ref 0–99)
TRIGLYCERIDES: 155 mg/dL — AB (ref 0.0–149.0)
VLDL: 31 mg/dL (ref 0.0–40.0)

## 2013-09-30 LAB — VITAMIN D 25 HYDROXY (VIT D DEFICIENCY, FRACTURES): Vit D, 25-Hydroxy: 26 ng/mL — ABNORMAL LOW (ref 30–89)

## 2013-10-05 ENCOUNTER — Ambulatory Visit (INDEPENDENT_AMBULATORY_CARE_PROVIDER_SITE_OTHER): Payer: Federal, State, Local not specified - PPO | Admitting: Family Medicine

## 2013-10-05 ENCOUNTER — Encounter: Payer: Self-pay | Admitting: Family Medicine

## 2013-10-05 VITALS — BP 160/100 | HR 64 | Temp 98.2°F | Ht 66.0 in | Wt 215.0 lb

## 2013-10-05 DIAGNOSIS — Z0000 Encounter for general adult medical examination without abnormal findings: Secondary | ICD-10-CM

## 2013-10-05 DIAGNOSIS — R030 Elevated blood-pressure reading, without diagnosis of hypertension: Secondary | ICD-10-CM

## 2013-10-05 DIAGNOSIS — C44599 Other specified malignant neoplasm of skin of other part of trunk: Secondary | ICD-10-CM

## 2013-10-05 DIAGNOSIS — E559 Vitamin D deficiency, unspecified: Secondary | ICD-10-CM

## 2013-10-05 DIAGNOSIS — I10 Essential (primary) hypertension: Secondary | ICD-10-CM | POA: Insufficient documentation

## 2013-10-05 DIAGNOSIS — Z0001 Encounter for general adult medical examination with abnormal findings: Secondary | ICD-10-CM | POA: Insufficient documentation

## 2013-10-05 DIAGNOSIS — E669 Obesity, unspecified: Secondary | ICD-10-CM

## 2013-10-05 DIAGNOSIS — IMO0001 Reserved for inherently not codable concepts without codable children: Secondary | ICD-10-CM

## 2013-10-05 DIAGNOSIS — D72819 Decreased white blood cell count, unspecified: Secondary | ICD-10-CM

## 2013-10-05 MED ORDER — LEVOCETIRIZINE DIHYDROCHLORIDE 5 MG PO TABS: 5.0000 mg | ORAL_TABLET | Freq: Every evening | ORAL | Status: DC

## 2013-10-05 NOTE — Assessment & Plan Note (Signed)
Preventative protocols reviewed and updated unless pt declined. Discussed healthy diet and lifestyle.  

## 2013-10-05 NOTE — Assessment & Plan Note (Addendum)
Per patient discharged from surgery.

## 2013-10-05 NOTE — Patient Instructions (Signed)
Think about tetanus shot for next visit. Stop zyrtec, trial of prescription strength xyzal sent to pharmacy today. Return at your convenience for recheck blood work. Good to see you today, call us with questions.

## 2013-10-05 NOTE — Assessment & Plan Note (Signed)
Reviewed this with patient. Pt attributes to increased stress with starting school and weight. Will work on weight loss, if persistent will need medications to control BP.  BP Readings from Last 3 Encounters:  10/05/13 160/100  08/18/12 142/88  06/27/12 148/100

## 2013-10-05 NOTE — Progress Notes (Signed)
Pre visit review using our clinic review tool, if applicable. No additional management support is needed unless otherwise documented below in the visit note. 

## 2013-10-05 NOTE — Assessment & Plan Note (Signed)
rec start 1000 IU daily for insufficiency.

## 2013-10-05 NOTE — Assessment & Plan Note (Signed)
Encouraged healthy diet changes and increased regular exercise for sustainable weight loss.

## 2013-10-05 NOTE — Assessment & Plan Note (Signed)
Check periph smear when returns for further blood work.

## 2013-10-05 NOTE — Progress Notes (Signed)
BP 160/100  Pulse 64  Temp(Src) 98.2 F (36.8 C) (Oral)  Ht 5\' 6"  (1.676 m)  Wt 215 lb (97.523 kg)  BMI 34.72 kg/m2  LMP 09/22/2013   CC: CPE  Subjective:    Patient ID: [name], female    DOB: [birth year], 48 y.o.   MRN: [id number]  HPI: [name] is a 48 y.o. female presenting on 10/05/2013 for Annual Exam   HTN - bp elevated today. 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 Also wonders about weight contributing.  Dermatofibroma protuberans excised R lower abdomen by surgery (2014).  Released from surgery per patient.   Wt Readings from Last 3 Encounters:  10/05/13 215 lb (97.523 kg)  08/18/12 207 lb (93.895 kg)  06/27/12 207 lb 3.2 oz (93.985 kg)  Body mass index is 34.72 kg/(m^2).  Preventative: Well woman at [place] ([place] [facility] - 2014 (normal pap per patient) Normal mammo in past Tdap - unsure. Declines Flu - declines LMP 09/22/2013 - not heavy bleeding  Caffeine: 1 cup coffee/day Lives with husband, 3 sons Occupation: works for [employer], started going back to school Activity: no regular exercise Diet: more water, high sugar, good fruits and vegetables  Relevant past medical, surgical, family and social history reviewed and updated as indicated.  Allergies and medications reviewed and updated. No current outpatient prescriptions on file prior to visit.   No current facility-administered medications on file prior to visit.    Review of Systems  Constitutional: Negative for fever, chills, activity change, appetite change, fatigue and unexpected weight change.       No night sweats  HENT: Positive for congestion (mild due to allergies). Negative for hearing loss.   Eyes: Negative for visual disturbance.  Respiratory: Negative for cough, chest tightness, shortness of breath and wheezing.   Cardiovascular: Negative for chest pain, palpitations and leg swelling.  Gastrointestinal: Negative for nausea, vomiting, abdominal  pain, diarrhea, constipation, blood in stool and abdominal distention.  Genitourinary: Negative for hematuria and difficulty urinating.  Musculoskeletal: Negative for arthralgias, myalgias and neck pain.  Skin: Negative for rash.  Neurological: Negative for dizziness, seizures, syncope and headaches.  Hematological: Negative for adenopathy. Does not bruise/bleed easily.  Psychiatric/Behavioral: Negative for dysphoric mood. The patient is not nervous/anxious.    Per HPI unless specifically indicated above    Objective:    BP 160/100  Pulse 64  Temp(Src) 98.2 F (36.8 C) (Oral)  Ht 5\' 6"  (1.676 m)  Wt 215 lb (97.523 kg)  BMI 34.72 kg/m2  LMP 09/22/2013  Physical Exam  Nursing note and vitals reviewed. Constitutional: She is oriented to person, place, and time. She appears well-developed and well-nourished. No distress.  HENT:  Head: Normocephalic and atraumatic.  Right Ear: Hearing, tympanic membrane, external ear and ear canal normal.  Left Ear: Hearing, tympanic membrane, external ear and ear canal normal.  Nose: Nose normal.  Mouth/Throat: Uvula is midline, oropharynx is clear and moist and mucous membranes are normal. No oropharyngeal exudate, posterior oropharyngeal edema or posterior oropharyngeal erythema.  Eyes: Conjunctivae and EOM are normal. Pupils are equal, round, and reactive to light. No scleral icterus.  Neck: Normal range of motion. Neck supple. No thyromegaly present.  Cardiovascular: Normal rate, regular rhythm, normal heart sounds and intact distal pulses.   No murmur heard. Pulses:      Radial pulses are 2+ on the right side, and 2+ on the left side.  Pulmonary/Chest: Effort normal and breath sounds normal. No  respiratory distress. She has no wheezes. She has no rales.  Abdominal: Soft. Bowel sounds are normal. She exhibits no distension and no mass. There is no tenderness. There is no rebound and no guarding.  Musculoskeletal: Normal range of motion. She  exhibits no edema.  Lymphadenopathy:       Head (right side): No submental, no submandibular, no tonsillar, no preauricular and no posterior auricular adenopathy present.       Head (left side): No submental, no submandibular, no tonsillar, no preauricular and no posterior auricular adenopathy present.    She has no cervical adenopathy.  Neurological: She is alert and oriented to person, place, and time.  CN grossly intact, station and gait intact  Skin: Skin is warm and dry. No rash noted.  Psychiatric: She has a normal mood and affect. Her behavior is normal. Judgment and thought content normal.   Results for orders placed in visit on 09/29/13  CBC WITH DIFFERENTIAL      Result Value Ref Range   WBC 3.9 (*) 4.0 - 10.5 K/uL   RBC 4.68  3.87 - 5.11 Mil/uL   Hemoglobin 14.2  12.0 - 15.0 g/dL   HCT 42.1  36.0 - 46.0 %   MCV 90.0  78.0 - 100.0 fl   MCHC 33.8  30.0 - 36.0 g/dL   RDW 12.5  11.5 - 15.5 %   Platelets 239.0  150.0 - 400.0 K/uL   Neutrophils Relative % 31.2 (*) 43.0 - 77.0 %   Lymphocytes Relative 57.3 Repeated and verified X2. (*) 12.0 - 46.0 %   Monocytes Relative 8.3  3.0 - 12.0 %   Eosinophils Relative 2.6  0.0 - 5.0 %   Basophils Relative 0.6  0.0 - 3.0 %   Neutro Abs 1.2 (*) 1.4 - 7.7 K/uL   Lymphs Abs 2.3  0.7 - 4.0 K/uL   Monocytes Absolute 0.3  0.1 - 1.0 K/uL   Eosinophils Absolute 0.1  0.0 - 0.7 K/uL   Basophils Absolute 0.0  0.0 - 0.1 K/uL  BASIC METABOLIC PANEL      Result Value Ref Range   Sodium 138  135 - 145 mEq/L   Potassium 3.8  3.5 - 5.1 mEq/L   Chloride 100  96 - 112 mEq/L   CO2 32  19 - 32 mEq/L   Glucose, Bld 88  70 - 99 mg/dL   BUN 11  6 - 23 mg/dL   Creatinine, Ser 1.0  0.4 - 1.2 mg/dL   Calcium 9.4  8.4 - 10.5 mg/dL   GFR 76.86  >60.00 mL/min  VITAMIN D 25 HYDROXY      Result Value Ref Range   Vit D, 25-Hydroxy 26 (*) 30 - 89 ng/mL  LIPID PANEL      Result Value Ref Range   Cholesterol 190  0 - 200 mg/dL   Triglycerides 155.0 (*) 0.0 -  149.0 mg/dL   HDL 50.20  >39.00 mg/dL   VLDL 31.0  0.0 - 40.0 mg/dL   LDL Cholesterol 109 (*) 0 - 99 mg/dL   Total CHOL/HDL Ratio 4        Assessment & Plan:   Problem List Items Addressed This Visit   Vitamin D deficiency     rec start 1000 IU daily for insufficiency.    Obesity     Encouraged healthy diet changes and increased regular exercise for sustainable weight loss.    Leukopenia     Check periph smear when returns for  further blood work.    Health maintenance examination - Primary     Preventative protocols reviewed and updated unless pt declined. Discussed healthy diet and lifestyle.     Elevated BP      Reviewed this with patient. Pt attributes to increased stress with starting school and weight. Will work on weight loss, if persistent will need medications to control BP.  BP Readings from Last 3 Encounters:  10/05/13 160/100  08/18/12 142/88  06/27/12 148/100      Dermatofibrosarcoma protuberans of pubic region     Per patient discharged from surgery.    Relevant Medications      levocetirizine (XYZAL) tablet 5 mg       Follow up plan: Return in about 1 year (around 10/06/2014), or as needed, for annual exam, prior fasting for blood work.

## 2013-11-29 ENCOUNTER — Telehealth: Payer: Self-pay | Admitting: Family Medicine

## 2013-11-29 ENCOUNTER — Encounter: Payer: Self-pay | Admitting: Family Medicine

## 2013-11-29 MED ORDER — AMLODIPINE BESYLATE 5 MG PO TABS: 5.0000 mg | ORAL_TABLET | Freq: Every day | ORAL | Status: DC

## 2013-11-29 NOTE — Telephone Encounter (Signed)
Amlodipine 5mg  sent to pharmacy. Watch for ankle swelling on this med. Start med and recommend schedule f/u appt in 3-4 wks.

## 2013-11-29 NOTE — Telephone Encounter (Signed)
Pt left vm on triage line stating that she went to her gynecologist yesterday and BP was 176/104.  She was told to follow up with her PCP for treatment.  Last OV on 10/05/13 r/t HTN states:  Reviewed this with patient. Pt attributes to increased stress with starting school and weight.  Will work on weight loss, if persistent will need medications to control BP.  BP Readings from Last 3 Encounters:   10/05/13  160/100   08/18/12  142/88   06/27/12  148/100    Does pt need another appt or can RX be sent in?

## 2013-11-30 NOTE — Telephone Encounter (Signed)
Patient notified, verbalized understanding and appt scheduled.

## 2013-12-25 ENCOUNTER — Telehealth: Payer: Self-pay | Admitting: Family Medicine

## 2013-12-25 ENCOUNTER — Ambulatory Visit (INDEPENDENT_AMBULATORY_CARE_PROVIDER_SITE_OTHER): Payer: Federal, State, Local not specified - PPO | Admitting: Family Medicine

## 2013-12-25 ENCOUNTER — Encounter: Payer: Self-pay | Admitting: Family Medicine

## 2013-12-25 VITALS — BP 148/98 | HR 72 | Temp 98.2°F | Wt 212.2 lb

## 2013-12-25 DIAGNOSIS — J309 Allergic rhinitis, unspecified: Secondary | ICD-10-CM | POA: Insufficient documentation

## 2013-12-25 DIAGNOSIS — E559 Vitamin D deficiency, unspecified: Secondary | ICD-10-CM

## 2013-12-25 DIAGNOSIS — E669 Obesity, unspecified: Secondary | ICD-10-CM

## 2013-12-25 DIAGNOSIS — I10 Essential (primary) hypertension: Secondary | ICD-10-CM

## 2013-12-25 DIAGNOSIS — J3089 Other allergic rhinitis: Secondary | ICD-10-CM

## 2013-12-25 DIAGNOSIS — J302 Other seasonal allergic rhinitis: Secondary | ICD-10-CM

## 2013-12-25 MED ORDER — AMLODIPINE BESYLATE 10 MG PO TABS
10.0000 mg | ORAL_TABLET | Freq: Every day | ORAL | Status: DC
Start: 2013-12-25 — End: 2015-01-04

## 2013-12-25 MED ORDER — FLUTICASONE PROPIONATE 50 MCG/ACT NA SUSP: 2.0000 | Freq: Every day | NASAL | Status: DC

## 2013-12-25 NOTE — Addendum Note (Signed)
Addended by: Royann Shivers A on: 12/25/2013 08:48 AM   Modules accepted: Orders

## 2013-12-25 NOTE — Telephone Encounter (Signed)
Relevant patient education mailed to patient.  

## 2013-12-25 NOTE — Progress Notes (Signed)
   BP 148/98  Pulse 72  Temp(Src) 98.2 F (36.8 C) (Oral)  Wt 212 lb 4 oz (96.276 kg)  LMP 12/04/2013   CC: HTN f/u  Subjective:    Patient ID: [name], female    DOB: 1965-10-17, 48 y.o.   MRN: [id number]  HPI: [name] is a 48 y.o. female presenting on 12/25/2013 for Follow-up   HTN - previous elevated readings attributed to restarting school - studying business administration at [employer]. Also wonders about weight contributing. Worked on diet/lifestyle changes for 1 month but bp remained elevated so we started amlodipine 5mg  daily. Tolerating well.  Does check blood pressures at home: 140/90s.  No low blood pressure readings or symptoms of dizziness/syncope.  Denies HA, vision changes, CP/tightness, SOB, leg swelling.   Wt Readings from Last 3 Encounters:  12/25/13 212 lb 4 oz (96.276 kg)  10/05/13 215 lb (97.523 kg)  08/18/12 207 lb (93.895 kg)   Body mass index is 34.27 kg/(m^2). Cutting back on snacks, working out, walking more. Pt thinking about elliptical.  Dermatofibroma protuberans excised R lower abdomen by surgery (2014). Released from surgery per patient.  Relevant past medical, surgical, family and social history reviewed and updated as indicated.  Allergies and medications reviewed and updated. Current Outpatient Prescriptions on File Prior to Visit  Medication Sig  . levocetirizine (XYZAL) 5 MG tablet Take 1 tablet (5 mg total) by mouth every evening.  . cholecalciferol (VITAMIN D) 1000 UNITS tablet Take 1,000 Units by mouth daily.   No current facility-administered medications on file prior to visit.    Review of Systems Per HPI unless specifically indicated above    Objective:    BP 148/98  Pulse 72  Temp(Src) 98.2 F (36.8 C) (Oral)  Wt 212 lb 4 oz (96.276 kg)  LMP 12/04/2013  Physical Exam  Nursing note and vitals reviewed. Constitutional: She appears well-developed and well-nourished. No distress.  HENT:  Mouth/Throat: Oropharynx is  clear and moist. No oropharyngeal exudate.  Cardiovascular: Normal rate, regular rhythm, normal heart sounds and intact distal pulses.   No murmur heard. Pulmonary/Chest: Effort normal and breath sounds normal. No respiratory distress. She has no wheezes. She has no rales.  Musculoskeletal: She exhibits no edema.  Skin: Skin is warm and dry. No rash noted.  Psychiatric: She has a normal mood and affect.       Assessment & Plan:   Problem List Items Addressed This Visit   Allergic rhinitis     Requests trial flonase in place or nasacort. xyzal working well for her.    Essential hypertension, benign - Primary     Chronic, stable. Improved with low dose amlodipine but not at goal - so will increase to 10mg  daily. Baseline EKG today. rtc prn or in 6 mo for f/u HTN.    Relevant Medications      amLODIpine (NORVASC) tablet   Obesity     Congratulated on weight loss, encouraged continued efforts.    Vitamin D deficiency     Has not been taking. Encouraged she start this.        Follow up plan: Return in about 6 months (around 06/27/2014), or if symptoms worsen or fail to improve, for follow up visit.

## 2013-12-25 NOTE — Assessment & Plan Note (Signed)
Has not been taking. Encouraged she start this.

## 2013-12-25 NOTE — Assessment & Plan Note (Addendum)
Chronic, stable. Improved with low dose amlodipine but not at goal - so will increase to 10mg  daily. Baseline EKG today - NSR rate 75, normal axis and intervals, nonspecific T wave changes inferior and anteriolateral leads, no old to compare.. rtc prn or in 6 mo for f/u HTN.

## 2013-12-25 NOTE — Assessment & Plan Note (Signed)
Congratulated on weight loss, encouraged continued efforts.

## 2013-12-25 NOTE — Patient Instructions (Addendum)
Let's increase amlodipine to 10mg  daily. New dose sent to pharmacy.  Return in 6 mo for follow up visit, sooner if needed. Let me know sooner if bp consistently >140/90. EKG today. Try flonase for allergies.  Your goal blood pressure is <140/90. Work on low salt/sodium diet - goal <1.5gm (1,500mg ) per day. Eat a diet high in fruits/vegetables and whole grains.  Look into mediterranean and DASH diet. Goal activity is 14min/wk of moderate intensity exercise.  This can be split into 30 minute chunks.  If you are not at this level, you can start with smaller 10-15 min increments and slowly build up activity.  Look at [place].org for more resources.   DASH Eating Plan DASH stands for "Dietary Approaches to Stop Hypertension." The DASH eating plan is a healthy eating plan that has been shown to reduce high blood pressure (hypertension). Additional health benefits may include reducing the risk of type 2 diabetes mellitus, heart disease, and stroke. The DASH eating plan may also help with weight loss. WHAT DO I NEED TO KNOW ABOUT THE DASH EATING PLAN? For the DASH eating plan, you will follow these general guidelines:  Choose foods with a percent daily value for sodium of less than 5% (as listed on the food label).  Use salt-free seasonings or herbs instead of table salt or sea salt.  Check with your health care provider or pharmacist before using salt substitutes.  Eat lower-sodium products, often labeled as "lower sodium" or "no salt added."  Eat fresh foods.  Eat more vegetables, fruits, and low-fat dairy products.  Choose whole grains. Look for the word "whole" as the first word in the ingredient list.  Choose fish and skinless chicken or [place] more often than red meat. Limit fish, poultry, and meat to 6 oz (170 g) each day.  Limit sweets, desserts, sugars, and sugary drinks.  Choose heart-healthy fats.  Limit cheese to 1 oz (28 g) per day.  Eat more home-cooked food and less  restaurant, buffet, and fast food.  Limit fried foods.  Cook foods using methods other than frying.  Limit canned vegetables. If you do use them, rinse them well to decrease the sodium.  When eating at a restaurant, ask that your food be prepared with less salt, or no salt if possible. WHAT FOODS CAN I EAT? Seek help from a dietitian for individual calorie needs. Grains Whole grain or whole wheat bread. Brown rice. Whole grain or whole wheat pasta. Quinoa, bulgur, and whole grain cereals. Low-sodium cereals. Corn or whole wheat flour tortillas. Whole grain cornbread. Whole grain crackers. Low-sodium crackers. Vegetables Fresh or frozen vegetables (raw, steamed, roasted, or grilled). Low-sodium or reduced-sodium tomato and vegetable juices. Low-sodium or reduced-sodium tomato sauce and paste. Low-sodium or reduced-sodium canned vegetables.  Fruits All fresh, canned (in natural juice), or frozen fruits. Meat and Other Protein Products Ground beef (85% or leaner), grass-fed beef, or beef trimmed of fat. Skinless chicken or [place]. Ground chicken or [place]. Pork trimmed of fat. All fish and seafood. Eggs. Dried beans, peas, or lentils. Unsalted nuts and seeds. Unsalted canned beans. Dairy Low-fat dairy products, such as skim or 1% milk, 2% or reduced-fat cheeses, low-fat ricotta or cottage cheese, or plain low-fat yogurt. Low-sodium or reduced-sodium cheeses. Fats and Oils Tub margarines without trans fats. Light or reduced-fat mayonnaise and salad dressings (reduced sodium). Avocado. Safflower, olive, or canola oils. Natural peanut or almond butter. Other Unsalted popcorn and pretzels. The items listed above may not be a complete  list of recommended foods or beverages. Contact your dietitian for more options. WHAT FOODS ARE NOT RECOMMENDED? Grains White bread. White pasta. White rice. Refined cornbread. Bagels and croissants. Crackers that contain trans fat. Vegetables Creamed or fried  vegetables. Vegetables in a cheese sauce. Regular canned vegetables. Regular canned tomato sauce and paste. Regular tomato and vegetable juices. Fruits Dried fruits. Canned fruit in light or heavy syrup. Fruit juice. Meat and Other Protein Products Fatty cuts of meat. Ribs, chicken wings, bacon, sausage, bologna, salami, chitterlings, fatback, hot dogs, bratwurst, and packaged luncheon meats. Salted nuts and seeds. Canned beans with salt. Dairy Whole or 2% milk, cream, half-and-half, and cream cheese. Whole-fat or sweetened yogurt. Full-fat cheeses or blue cheese. Nondairy creamers and whipped toppings. Processed cheese, cheese spreads, or cheese curds. Condiments Onion and garlic salt, seasoned salt, table salt, and sea salt. Canned and packaged gravies. Worcestershire sauce. Tartar sauce. Barbecue sauce. Teriyaki sauce. Soy sauce, including reduced sodium. Steak sauce. Fish sauce. Oyster sauce. Cocktail sauce. Horseradish. Ketchup and mustard. Meat flavorings and tenderizers. Bouillon cubes. Hot sauce. Tabasco sauce. Marinades. Taco seasonings. Relishes. Fats and Oils Butter, stick margarine, lard, shortening, ghee, and bacon fat. Coconut, palm kernel, or palm oils. Regular salad dressings. Other Pickles and olives. Salted popcorn and pretzels. The items listed above may not be a complete list of foods and beverages to avoid. Contact your dietitian for more information. WHERE CAN I FIND MORE INFORMATION? National Heart, Lung, and Blood Institute: travelstabloid.com Document Released: 04/09/2011 Document Revised: 09/04/2013 Document Reviewed: 02/22/2013 [facility] Patient Information 2015 [place], [place]. This information is not intended to replace advice given to you by your health care provider. Make sure you discuss any questions you have with your health care provider.

## 2013-12-25 NOTE — Assessment & Plan Note (Signed)
Requests trial flonase in place or nasacort. xyzal working well for her.

## 2013-12-25 NOTE — Progress Notes (Signed)
Pre visit review using our clinic review tool, if applicable. No additional management support is needed unless otherwise documented below in the visit note. 

## 2014-01-05 ENCOUNTER — Ambulatory Visit: Payer: Federal, State, Local not specified - PPO | Admitting: Podiatrist

## 2014-01-05 ENCOUNTER — Ambulatory Visit (INDEPENDENT_AMBULATORY_CARE_PROVIDER_SITE_OTHER): Payer: Federal, State, Local not specified - PPO

## 2014-01-05 ENCOUNTER — Encounter: Payer: Self-pay | Admitting: Podiatrist

## 2014-01-05 ENCOUNTER — Ambulatory Visit (INDEPENDENT_AMBULATORY_CARE_PROVIDER_SITE_OTHER): Payer: Federal, State, Local not specified - PPO | Admitting: Podiatrist

## 2014-01-05 VITALS — BP 156/93 | HR 92 | Resp 13 | Ht 66.0 in | Wt 205.0 lb

## 2014-01-05 VITALS — BP 156/93 | HR 92 | Resp 12

## 2014-01-05 DIAGNOSIS — M722 Plantar fascial fibromatosis: Secondary | ICD-10-CM

## 2014-01-05 MED ORDER — TRIAMCINOLONE ACETONIDE 10 MG/ML IJ SUSP: 10.0000 mg | Freq: Once | INTRAMUSCULAR | Status: DC

## 2014-01-05 NOTE — Progress Notes (Signed)
Patient presents back today for injection of the plantar fasciitis left. This was carried out today under sterile technique without complication with Kenalog and Marcaine mixture.

## 2014-01-05 NOTE — Progress Notes (Signed)
   Subjective:    Patient ID: [name], female    DOB: 1965/11/10, 48 y.o.   MRN: [id number]  HPI Comments: Pt complains of left heel pain for 4 - 5 month, stinging pain.  Pt states is worse after rest, and has used ice bottle massage occasionally.     Review of Systems  All other systems reviewed and are negative.      Objective:   Physical Exam GENERAL APPEARANCE: Alert, conversant. Appropriately groomed. No acute distress.  VASCULAR: Pedal pulses palpable and strong bilateral.  Capillary refill time is immediate to all digits,  Proximal to distal cooling it warm to warm.  Digital hair growth is present bilateral  NEUROLOGIC: sensation is intact epicritically and protectively to 5.07 monofilament at 5/5 sites bilateral.  Light touch is intact bilateral, vibratory sensation intact bilateral, achilles tendon reflex is intact bilateral.  Negative Tinel sign is elicited MUSCULOSKELETAL: Pain on palpation plantar medial aspect left foot  at insertion of plantar fascia on the medial calcaneal tubercle. Inflammation at the insertion of the plantar fascia is present.  DERMATOLOGIC: skin color, texture, and turger are within normal limits.  No preulcerative lesions are seen, no interdigital maceration noted.  No open lesions present.  Digital nails are asymptomatic.      Assessment & Plan:  Plantar fasciitis left  Plan: Recommended a steroid injection and she initially declined and then came back later in the day and requested the injection be performed. We did accomplish this under sterile technique and the patient tolerated the procedure well. Recommended power step inserts and these were dispensed for her use. Stretching exercises and shoe gear changes were also discussed she will call if any problems or concerns arise.

## 2014-01-05 NOTE — Patient Instructions (Signed)
Plantar Fasciitis Plantar fasciitis is a common condition that causes foot pain. It is soreness (inflammation) of the band of tough fibrous tissue on the bottom of the foot that runs from the heel bone (calcaneus) to the ball of the foot. The cause of this soreness may be from excessive standing, poor fitting shoes, running on hard surfaces, being overweight, having an abnormal walk, or overuse (this is common in runners) of the painful foot or feet. It is also common in aerobic exercise dancers and ballet dancers. SYMPTOMS  Most people with plantar fasciitis complain of:  Severe pain in the morning on the bottom of their foot especially when taking the first steps out of bed. This pain recedes after a few minutes of walking.  Severe pain is experienced also during walking following a long period of inactivity.  Pain is worse when walking barefoot or up stairs DIAGNOSIS   Your caregiver will diagnose this condition by examining and feeling your foot.  Special tests such as X-rays of your foot, are usually not needed. PREVENTION   Consult a sports medicine professional before beginning a new exercise program.  Walking programs offer a good workout. With walking there is a lower chance of overuse injuries common to runners. There is less impact and less jarring of the joints.  Begin all new exercise programs slowly. If problems or pain develop, decrease the amount of time or distance until you are at a comfortable level.  Wear good shoes and replace them regularly.  Stretch your foot and the heel cords at the back of the ankle (Achilles tendon) both before and after exercise.  Run or exercise on even surfaces that are not hard. For example, asphalt is better than pavement.  Do not run barefoot on hard surfaces.  If using a treadmill, vary the incline.  Do not continue to workout if you have foot or joint problems. Seek professional help if they do not improve. HOME CARE INSTRUCTIONS     Avoid activities that cause you pain until you recover.  Use ice or cold packs on the problem or painful areas after working out.  Only take over-the-counter or prescription medicines for pain, discomfort, or fever as directed by your caregiver.  Soft shoe inserts or athletic shoes with air or gel sole cushions may be helpful.  If problems continue or become more severe, consult a sports medicine caregiver or your own health care provider. Cortisone is a potent anti-inflammatory medication that may be injected into the painful area. You can discuss this treatment with your caregiver. MAKE SURE YOU:   Understand these instructions.  Will watch your condition.  Will get help right away if you are not doing well or get worse. Document Released: 01/13/2001 Document Revised: 07/13/2011 Document Reviewed: 03/14/2008 Exit[employer] Patient Information 2015 [employer]. This information is not intended to replace advice given to you by your health care provider. Make sure you discuss any questions you have with your health care provider.   Plantar Fasciitis (Heel Spur Syndrome) with Rehab The plantar fascia is a fibrous, ligament-like, soft-tissue structure that spans the bottom of the foot. Plantar fasciitis is a condition that causes pain in the foot due to inflammation of the tissue. SYMPTOMS   Pain and tenderness on the underneath side of the foot.  Pain that worsens with standing or walking. CAUSES  Plantar fasciitis is caused by irritation and injury to the plantar fascia on the underneath side of the foot. Common mechanisms of injury include:    Direct trauma to bottom of the foot.  Damage to a small nerve that runs under the foot where the main fascia attaches to the heel bone. Stress placed on the plantar fascia due to any mild increased activity or injury RISK INCREASES WITH:   Obesity.  Poor strength and flexibility.  Improperly fitted shoes.  Tight calf muscles.  Flat  feet.  Failure to warm-up properly before activity.  PREVENTION  Warm up and stretch properly before activity.  Strength, flexibility  Maintain a health body weight.  Avoid stress on the plantar fascia.  Wear properly fitted shoes, including arch supports for individuals who have flat feet. PROGNOSIS  If treated properly, then the symptoms of plantar fasciitis usually resolve without surgery. However, occasionally surgery is necessary. RELATED COMPLICATIONS   Recurrent symptoms that may result in a chronic condition.  Problems of the lower back that are caused by compensating for the injury, such as limping.  Pain or weakness of the foot during push-off following surgery.  Chronic inflammation, scarring, and partial or complete fascia tear, occurring more often from repeated injections. TREATMENT  Treatment initially involves the use of ice and medication to help reduce pain and inflammation. The use of strengthening and stretching exercises may help reduce pain with activity, especially stretches of the Achilles tendon.  Your caregiver may recommend that you use arch supports to help reduce stress on the plantar fascia. Often, corticosteroid injections are given to reduce inflammation. If symptoms persist for greater than 6 months despite non-surgical (conservative), then surgery may be recommended.  MEDICATION   If pain medication is necessary, then nonsteroidal anti-inflammatory medications, such as aspirin and ibuprofen, or other minor pain relievers, such as acetaminophen, are often recommended. Corticosteroid injections may be given by your caregiver.  HEAT AND COLD  Cold treatment (icing) relieves pain and reduces inflammation. Cold treatment should be applied for 10 to 15 minutes every 2 to 3 hours for inflammation and pain and immediately after any activity that aggravates your symptoms. Use ice packs or massage the area with a piece of ice (ice massage).  Heat treatment  may be used prior to performing the stretching and strengthening activities prescribed by your caregiver, physical therapist, or athletic trainer. Use a heat pack or soak the injury in warm water. SEEK IMMEDIATE MEDICAL CARE IF:  Treatment seems to offer no benefit, or the condition worsens.  Any medications produce adverse side effects.    EXERCISES-- perform each exercise a total of 10-15 repetitions.  Hold for 30 seconds and perform 3 times per day   RANGE OF MOTION (ROM) AND STRETCHING EXERCISES - Plantar Fasciitis (Heel Spur Syndrome) These exercises may help you when beginning to rehabilitate your injury.   While completing these exercises, remember:   Restoring tissue flexibility helps normal motion to return to the joints. This allows healthier, less painful movement and activity.  An effective stretch should be held for at least 30 seconds.  A stretch should never be painful. You should only feel a gentle lengthening or release in the stretched tissue. RANGE OF MOTION - Toe Extension, Flexion  Sit with your right / left leg crossed over your opposite knee.  Grasp your toes and gently pull them back toward the top of your foot. You should feel a stretch on the bottom of your toes and/or foot.  Hold this stretch for __________ seconds.  Now, gently pull your toes toward the bottom of your foot. You should feel a stretch on the top  of your toes and or foot.  Hold this stretch for __________ seconds. Repeat __________ times. Complete this stretch __________ times per day.  RANGE OF MOTION - Ankle Dorsiflexion, Active Assisted  Remove shoes and sit on a chair that is preferably not on a carpeted surface.  Place right / left foot under knee. Extend your opposite leg for support.  Keeping your heel down, slide your right / left foot back toward the chair until you feel a stretch at your ankle or calf. If you do not feel a stretch, slide your bottom forward to the edge of the  chair, while still keeping your heel down.  Hold this stretch for __________ seconds. Repeat __________ times. Complete this stretch __________ times per day.  STRETCH  Gastroc, Standing  Place hands on wall.  Extend right / left leg, keeping the front knee somewhat bent.  Slightly point your toes inward on your back foot.  Keeping your right / left heel on the floor and your knee straight, shift your weight toward the wall, not allowing your back to arch.  You should feel a gentle stretch in the right / left calf. Hold this position for __________ seconds. Repeat __________ times. Complete this stretch __________ times per day. STRETCH  Soleus, Standing  Place hands on wall.  Extend right / left leg, keeping the other knee somewhat bent.  Slightly point your toes inward on your back foot.  Keep your right / left heel on the floor, bend your back knee, and slightly shift your weight over the back leg so that you feel a gentle stretch deep in your back calf.  Hold this position for __________ seconds. Repeat __________ times. Complete this stretch __________ times per day. STRETCH  Gastrocsoleus, Standing  Note: This exercise can place a lot of stress on your foot and ankle. Please complete this exercise only if specifically instructed by your caregiver.   Place the ball of your right / left foot on a step, keeping your other foot firmly on the same step.  Hold on to the wall or a rail for balance.  Slowly lift your other foot, allowing your body weight to press your heel down over the edge of the step.  You should feel a stretch in your right / left calf.  Hold this position for __________ seconds.  Repeat this exercise with a slight bend in your right / left knee. Repeat __________ times. Complete this stretch __________ times per day.  STRENGTHENING EXERCISES - Plantar Fasciitis (Heel Spur Syndrome)  These exercises may help you when beginning to rehabilitate your  injury. They may resolve your symptoms with or without further involvement from your physician, physical therapist or athletic trainer. While completing these exercises, remember:   Muscles can gain both the endurance and the strength needed for everyday activities through controlled exercises.  Complete these exercises as instructed by your physician, physical therapist or athletic trainer. Progress the resistance and repetitions only as guided.

## 2014-07-10 ENCOUNTER — Ambulatory Visit: Payer: Federal, State, Local not specified - PPO | Admitting: Family Medicine

## 2014-07-13 ENCOUNTER — Ambulatory Visit: Payer: Federal, State, Local not specified - PPO | Admitting: Family Medicine

## 2014-07-16 ENCOUNTER — Encounter: Payer: Self-pay | Admitting: Family Medicine

## 2014-07-16 ENCOUNTER — Ambulatory Visit (INDEPENDENT_AMBULATORY_CARE_PROVIDER_SITE_OTHER): Payer: Federal, State, Local not specified - PPO | Admitting: Family Medicine

## 2014-07-16 VITALS — BP 128/90 | HR 68 | Temp 98.1°F | Wt 213.5 lb

## 2014-07-16 DIAGNOSIS — R0683 Snoring: Secondary | ICD-10-CM | POA: Insufficient documentation

## 2014-07-16 DIAGNOSIS — I10 Essential (primary) hypertension: Secondary | ICD-10-CM

## 2014-07-16 DIAGNOSIS — E669 Obesity, unspecified: Secondary | ICD-10-CM

## 2014-07-16 NOTE — Assessment & Plan Note (Signed)
ESS = 2 today. Very low risk of OSA. Pt denies any significant sxs other than snoring. Anticipate weight related snoring, discussed. Encouraged weight loss. See below. Pt will update [place] if desires referral for sleep study. Risk factors include obesity and hypertension.

## 2014-07-16 NOTE — Assessment & Plan Note (Signed)
bp stable. Continue amlodipine 10mg  daily.

## 2014-07-16 NOTE — Progress Notes (Signed)
Pre visit review using our clinic review tool, if applicable. No additional management support is needed unless otherwise documented below in the visit note. 

## 2014-07-16 NOTE — Patient Instructions (Addendum)
I don't think we have an issue with sleep apnea but to fully rule this out would be sleep study. Let us know if you want to set that up.  For now work on diet, exercise, weight, sleep on side, check with dentist.

## 2014-07-16 NOTE — Progress Notes (Signed)
BP 128/90 mmHg  Pulse 68  Temp(Src) 98.1 F (36.7 C) (Oral)  Wt 213 lb 8 oz (96.843 kg)  LMP 07/02/2014  CC:  snoring Subjective:    Patient ID: [name], female    DOB: [birth year], 49 y.o.   MRN: [id number]  HPI: [name] is a 49 y.o. female presenting on 07/16/2014 for Snoring   Ongoing issue for years. Pt sleeps well, but husband complains. Denies witnessed apneic episodes. No PNDyspnea. Endorses feeling well rested when she wakes up. No daytime sleepiness.   Feels flonase and xyzal help her sleep. Body mass index is 34.48 kg/(m^2).   Walking regularly for weight loss. Has lost 8 lbs.  Using MyFitness App for calorie counting.   Relevant past medical, surgical, family and social history reviewed and updated as indicated. Interim medical history since our last visit reviewed. Allergies and medications reviewed and updated. Current Outpatient Prescriptions on File Prior to Visit  Medication Sig  . amLODipine (NORVASC) 10 MG tablet Take 1 tablet (10 mg total) by mouth daily.  . cholecalciferol (VITAMIN D) 1000 UNITS tablet Take 1,000 Units by mouth daily.  . fluticasone (FLONASE) 50 MCG/ACT nasal spray Place 2 sprays into both nostrils daily.  Marland Kitchen levocetirizine (XYZAL) 5 MG tablet Take 1 tablet (5 mg total) by mouth every evening.   No current facility-administered medications on file prior to visit.    Review of Systems Per HPI unless specifically indicated above     Objective:    BP 128/90 mmHg  Pulse 68  Temp(Src) 98.1 F (36.7 C) (Oral)  Wt 213 lb 8 oz (96.843 kg)  LMP 07/02/2014  Wt Readings from Last 3 Encounters:  07/16/14 213 lb 8 oz (96.843 kg)  01/05/14 205 lb (92.987 kg)  12/25/13 212 lb 4 oz (96.276 kg)    Physical Exam  Constitutional: She appears well-developed and well-nourished. No distress.  HENT:  Head: Normocephalic and atraumatic.  Right Ear: Hearing, tympanic membrane, external ear and ear canal normal.  Left Ear: Hearing,  tympanic membrane, external ear and ear canal normal.  Nose: Mucosal edema (mild) present. No rhinorrhea. Right sinus exhibits no maxillary sinus tenderness and no frontal sinus tenderness. Left sinus exhibits no maxillary sinus tenderness and no frontal sinus tenderness.  Mouth/Throat: Uvula is midline, oropharynx is clear and moist and mucous membranes are normal. No oropharyngeal exudate, posterior oropharyngeal edema, posterior oropharyngeal erythema or tonsillar abscesses.  Eyes: Conjunctivae and EOM are normal. Pupils are equal, round, and reactive to light. No scleral icterus.  Neck: Normal range of motion. Neck supple.  Musculoskeletal: She exhibits no edema.  Lymphadenopathy:    She has no cervical adenopathy.  Skin: Skin is warm and dry. No rash noted.  Nursing note and vitals reviewed.     Assessment & Plan:   Problem List Items Addressed This Visit    Snoring - Primary    ESS = 2 today. Very low risk of OSA. Pt denies any significant sxs other than snoring. Anticipate weight related snoring, discussed. Encouraged weight loss. See below. Pt will update [place] if desires referral for sleep study. Risk factors include obesity and hypertension.      Obesity    Body mass index is 34.48 kg/(m^2).  Reviewed healthy diet and lifestyle changes to affect sustainable weight loss.       Essential hypertension, benign    bp stable. Continue amlodipine 10mg  daily.          Follow up plan:  Return as needed.

## 2014-07-16 NOTE — Assessment & Plan Note (Signed)
Body mass index is 34.48 kg/(m^2).  Reviewed healthy diet and lifestyle changes to affect sustainable weight loss.

## 2014-12-03 LAB — HM MAMMOGRAPHY: HM MAMMO: NORMAL

## 2014-12-03 LAB — HM PAP SMEAR: HM Pap smear: NORMAL

## 2014-12-25 ENCOUNTER — Other Ambulatory Visit: Payer: Self-pay

## 2014-12-26 LAB — CYTOLOGY - PAP

## 2014-12-29 ENCOUNTER — Other Ambulatory Visit: Payer: Self-pay | Admitting: Family Medicine

## 2014-12-29 DIAGNOSIS — D72819 Decreased white blood cell count, unspecified: Secondary | ICD-10-CM

## 2014-12-29 DIAGNOSIS — E559 Vitamin D deficiency, unspecified: Secondary | ICD-10-CM

## 2014-12-29 DIAGNOSIS — E669 Obesity, unspecified: Secondary | ICD-10-CM

## 2014-12-29 DIAGNOSIS — I10 Essential (primary) hypertension: Secondary | ICD-10-CM

## 2014-12-31 ENCOUNTER — Other Ambulatory Visit (INDEPENDENT_AMBULATORY_CARE_PROVIDER_SITE_OTHER): Payer: Federal, State, Local not specified - PPO

## 2014-12-31 DIAGNOSIS — E559 Vitamin D deficiency, unspecified: Secondary | ICD-10-CM | POA: Diagnosis not present

## 2014-12-31 DIAGNOSIS — D72819 Decreased white blood cell count, unspecified: Secondary | ICD-10-CM | POA: Diagnosis not present

## 2014-12-31 DIAGNOSIS — I10 Essential (primary) hypertension: Secondary | ICD-10-CM | POA: Diagnosis not present

## 2014-12-31 LAB — CBC WITH DIFFERENTIAL/PLATELET
Basophils Absolute: 0 10*3/uL (ref 0.0–0.1)
Basophils Relative: 0.5 % (ref 0.0–3.0)
EOS PCT: 2 % (ref 0.0–5.0)
Eosinophils Absolute: 0.1 10*3/uL (ref 0.0–0.7)
HCT: 41 % (ref 36.0–46.0)
Hemoglobin: 14 g/dL (ref 12.0–15.0)
LYMPHS ABS: 2.5 10*3/uL (ref 0.7–4.0)
Lymphocytes Relative: 60.4 % — ABNORMAL HIGH (ref 12.0–46.0)
MCHC: 34 g/dL (ref 30.0–36.0)
MCV: 89.8 fl (ref 78.0–100.0)
MONOS PCT: 9.2 % (ref 3.0–12.0)
Monocytes Absolute: 0.4 10*3/uL (ref 0.1–1.0)
NEUTROS PCT: 27.9 % — AB (ref 43.0–77.0)
Neutro Abs: 1.1 10*3/uL — ABNORMAL LOW (ref 1.4–7.7)
Platelets: 252 10*3/uL (ref 150.0–400.0)
RBC: 4.57 Mil/uL (ref 3.87–5.11)
RDW: 12.9 % (ref 11.5–15.5)
WBC: 4.1 10*3/uL (ref 4.0–10.5)

## 2014-12-31 LAB — LIPID PANEL
CHOL/HDL RATIO: 4
Cholesterol: 172 mg/dL (ref 0–200)
HDL: 47.9 mg/dL (ref >39.00)
LDL Cholesterol: 93 mg/dL (ref 0–99)
NonHDL: 123.78
TRIGLYCERIDES: 153 mg/dL — AB (ref 0.0–149.0)
VLDL: 30.6 mg/dL (ref 0.0–40.0)

## 2014-12-31 LAB — BASIC METABOLIC PANEL
BUN: 12 mg/dL (ref 6–23)
CALCIUM: 9.4 mg/dL (ref 8.4–10.5)
CO2: 28 meq/L (ref 19–32)
CREATININE: 1.08 mg/dL (ref 0.40–1.20)
Chloride: 103 mEq/L (ref 96–112)
GFR: 69.94 mL/min (ref >60.00)
GLUCOSE: 97 mg/dL (ref 70–99)
Potassium: 4.2 mEq/L (ref 3.5–5.1)
Sodium: 138 mEq/L (ref 135–145)

## 2014-12-31 LAB — VITAMIN D 25 HYDROXY (VIT D DEFICIENCY, FRACTURES): VITD: 21.13 ng/mL — AB (ref 30.00–100.00)

## 2015-01-02 ENCOUNTER — Encounter: Payer: Federal, State, Local not specified - PPO | Admitting: Family Medicine

## 2015-01-03 DIAGNOSIS — I77811 Abdominal aortic ectasia: Secondary | ICD-10-CM | POA: Insufficient documentation

## 2015-01-03 DIAGNOSIS — D259 Leiomyoma of uterus, unspecified: Secondary | ICD-10-CM

## 2015-01-03 DIAGNOSIS — K760 Fatty (change of) liver, not elsewhere classified: Secondary | ICD-10-CM

## 2015-01-03 DIAGNOSIS — K824 Cholesterolosis of gallbladder: Secondary | ICD-10-CM

## 2015-01-03 HISTORY — DX: Fatty (change of) liver, not elsewhere classified: K76.0

## 2015-01-03 HISTORY — DX: Abdominal aortic ectasia: I77.811

## 2015-01-03 HISTORY — DX: Cholesterolosis of gallbladder: K82.4

## 2015-01-03 HISTORY — DX: Leiomyoma of uterus, unspecified: D25.9

## 2015-01-04 ENCOUNTER — Ambulatory Visit (INDEPENDENT_AMBULATORY_CARE_PROVIDER_SITE_OTHER): Payer: Federal, State, Local not specified - PPO | Admitting: Family Medicine

## 2015-01-04 ENCOUNTER — Encounter: Payer: Self-pay | Admitting: Family Medicine

## 2015-01-04 VITALS — BP 122/82 | HR 85 | Temp 98.4°F | Ht 65.5 in | Wt 208.2 lb

## 2015-01-04 DIAGNOSIS — D72819 Decreased white blood cell count, unspecified: Secondary | ICD-10-CM

## 2015-01-04 DIAGNOSIS — Z0000 Encounter for general adult medical examination without abnormal findings: Secondary | ICD-10-CM | POA: Diagnosis not present

## 2015-01-04 DIAGNOSIS — R1900 Intra-abdominal and pelvic swelling, mass and lump, unspecified site: Secondary | ICD-10-CM | POA: Insufficient documentation

## 2015-01-04 DIAGNOSIS — D700 Congenital agranulocytosis: Secondary | ICD-10-CM | POA: Insufficient documentation

## 2015-01-04 DIAGNOSIS — D259 Leiomyoma of uterus, unspecified: Secondary | ICD-10-CM

## 2015-01-04 DIAGNOSIS — E669 Obesity, unspecified: Secondary | ICD-10-CM

## 2015-01-04 DIAGNOSIS — J302 Other seasonal allergic rhinitis: Secondary | ICD-10-CM

## 2015-01-04 DIAGNOSIS — R1902 Left upper quadrant abdominal swelling, mass and lump: Secondary | ICD-10-CM

## 2015-01-04 DIAGNOSIS — C44599 Other specified malignant neoplasm of skin of other part of trunk: Secondary | ICD-10-CM

## 2015-01-04 DIAGNOSIS — D72820 Lymphocytosis (symptomatic): Secondary | ICD-10-CM | POA: Insufficient documentation

## 2015-01-04 DIAGNOSIS — I10 Essential (primary) hypertension: Secondary | ICD-10-CM

## 2015-01-04 MED ORDER — LEVOCETIRIZINE DIHYDROCHLORIDE 5 MG PO TABS: 5.0000 mg | ORAL_TABLET | Freq: Every evening | ORAL | Status: DC

## 2015-01-04 MED ORDER — FLUTICASONE PROPIONATE 50 MCG/ACT NA SUSP: 2.0000 | Freq: Every day | NASAL | Status: DC

## 2015-01-04 MED ORDER — AMLODIPINE BESYLATE 10 MG PO TABS: 10.0000 mg | ORAL_TABLET | Freq: Every day | ORAL | Status: DC

## 2015-01-04 NOTE — Assessment & Plan Note (Signed)
Pt had said she had been released from surgery care. Reviewing last [facility] note, Dr Harlow Asa had referred her to [facility] dermatological surgery for consultation due to positive margins from initial surgery early 2014. Discussed this in detail with patient. Discussed small possibility of metastasis or malignant transformation of dermatofibrosarcoma. Pt aware of this possibility and still declines referral to [facility]. Denies current issues.

## 2015-01-04 NOTE — Progress Notes (Signed)
Pre visit review using our clinic review tool, if applicable. No additional management support is needed unless otherwise documented below in the visit note. 

## 2015-01-04 NOTE — Addendum Note (Signed)
Addended by: Ria Bush on: 01/04/2015 05:20 PM   Modules accepted: Orders, SmartSet

## 2015-01-04 NOTE — Assessment & Plan Note (Signed)
Refill flonase/xyzal.

## 2015-01-04 NOTE — Progress Notes (Signed)
BP 122/82 mmHg  Pulse 85  Temp(Src) 98.4 F (36.9 C) (Oral)  Ht 5' 5.5" (1.664 m)  Wt 208 lb 4 oz (94.462 kg)  BMI 34.12 kg/m2  SpO2 98%  LMP 12/07/2014   CC: CPE  Subjective:    Patient ID: [name], female    DOB: February 06, 1966, 49 y.o.   MRN: [id number]  HPI: [name] is a 49 y.o. 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  Back pain - lower back with stiffness after prolonged sitting. Slow improvement with increase in exercise regimen. Occasional paresthesias of bilateral hands. Denies neck pain, radiculopathy down arms or hand weakness.  Preventative: Well woman at [facility] [place] - 12/2014 (normal pap per patient), normal mammogram. Normal mammo in past Flu - declines Tdap - declines LMP 12/07/2014 - regular, not heavy bleeding   Caffeine: 1 cup coffee/day Lives with husband, 3 sons Occupation: works for [employer], started going back to school Activity: going to gym 3x/wk Diet: good water, fruits and vegetables daily  Relevant past medical, surgical, family and social history reviewed and updated as indicated. Interim medical history since our last visit reviewed. Allergies and medications reviewed and updated. Current Outpatient Prescriptions on File Prior to Visit  Medication Sig  . cholecalciferol (VITAMIN D) 1000 UNITS tablet Take 1,000 Units by mouth daily.   No current facility-administered medications on file prior to visit.    Review of Systems  Constitutional: Negative for fever, chills, activity change, appetite change, fatigue and unexpected weight change.  HENT: Negative for hearing loss.   Eyes: Negative for visual disturbance.  Respiratory: Negative for cough, chest tightness, shortness of breath and wheezing.   Cardiovascular: Negative for chest pain, palpitations and leg swelling.  Gastrointestinal:  Negative for nausea, vomiting, abdominal pain, diarrhea, constipation, blood in stool and abdominal distention.  Genitourinary: Negative for hematuria and difficulty urinating.  Musculoskeletal: Negative for myalgias, arthralgias and neck pain.  Skin: Negative for rash.  Neurological: Negative for dizziness, seizures, syncope and headaches.  Hematological: Negative for adenopathy. Does not bruise/bleed easily.  Psychiatric/Behavioral: Negative for dysphoric mood. The patient is not nervous/anxious.    Per HPI unless specifically indicated above     Objective:    BP 122/82 mmHg  Pulse 85  Temp(Src) 98.4 F (36.9 C) (Oral)  Ht 5' 5.5" (1.664 m)  Wt 208 lb 4 oz (94.462 kg)  BMI 34.12 kg/m2  SpO2 98%  LMP 12/07/2014  Wt Readings from Last 3 Encounters:  01/04/15 208 lb 4 oz (94.462 kg)  07/16/14 213 lb 8 oz (96.843 kg)  01/05/14 205 lb (92.987 kg)    Physical Exam  Constitutional: She is oriented to person, place, and time. She appears well-developed and well-nourished. No distress.  HENT:  Head: Normocephalic and atraumatic.  Right Ear: Hearing, tympanic membrane, external ear and ear canal normal.  Left Ear: Hearing, tympanic membrane, external ear and ear canal normal.  Nose: Nose normal.  Mouth/Throat: Uvula is midline, oropharynx is clear and moist and mucous membranes are normal. No oropharyngeal exudate, posterior oropharyngeal edema or posterior oropharyngeal erythema.  Eyes: Conjunctivae and EOM are normal. Pupils are equal, round, and reactive to light. No scleral icterus.  Neck: Normal range of motion. Neck supple. No thyromegaly present.  Cardiovascular: Normal rate, regular rhythm, normal heart sounds and intact distal pulses.   No murmur heard. Pulses:  Radial pulses are 2+ on the right side, and 2+ on the left side.  Pulmonary/Chest: Effort normal and breath sounds normal. No respiratory distress. She has no wheezes. She has no rales.  Abdominal: Soft. Normal  appearance and bowel sounds are normal. She exhibits mass (right and left sided masses). She exhibits no distension. There is no tenderness. There is no rigidity, no rebound, no guarding and negative Murphy's sign.  Firm nontender masses of abdomen LUQ, RLQ  Musculoskeletal: Normal range of motion. She exhibits no edema.  Lymphadenopathy:    She has no cervical adenopathy.  Neurological: She is alert and oriented to person, place, and time.  CN grossly intact, station and gait intact  Skin: Skin is warm and dry. No rash noted.  Psychiatric: She has a normal mood and affect. Her behavior is normal. Judgment and thought content normal.  Nursing note and vitals reviewed.  Results for orders placed or performed in visit on 01/04/15  HM MAMMOGRAPHY  Result Value Ref Range   HM Mammogram normal pre pt   HM PAP SMEAR  Result Value Ref Range   HM Pap smear normal per pt       Assessment & Plan:   Problem List Items Addressed This Visit    Uterine fibroid    Had ablation years ago, no problem since. Followed by OBGYN.      Obesity, Class I, BMI 30-34.9    Encouraged regular exercise at gym, avoiding added sugars/candy.      Dermatofibrosarcoma protuberans of pubic region    Pt had said she had been released from surgery care. Reviewing last [facility] note, Dr Harlow Asa had referred her to [facility] dermatological surgery for consultation due to positive margins from initial surgery early 2014. Discussed this in detail with patient. Discussed small possibility of metastasis or malignant transformation of dermatofibrosarcoma. Pt aware of this possibility and still declines referral to [facility]. Denies current issues.      Health maintenance examination - Primary    Preventative protocols reviewed and updated unless pt declined. Discussed healthy diet and lifestyle.       Leukopenia    Pt never returned for periph smear last year. WBC normal today. See below.       Essential hypertension, benign     Chronic, stable. Continue amlodipine 10mg  daily.      Relevant Medications   amLODipine (NORVASC) 10 MG tablet   Allergic rhinitis    Refill flonase/xyzal.      Persistent lymphocytosis    Persistent over last few years. Discussed with patient - will check periph smear and LDH today.       Relevant Orders   Pathologist smear review   Lactate dehydrogenase   Abdominal mass, left upper quadrant    Per pt longstanding masses in abdomen - palpable masses LUQ and R side of abdomen. Will start with abd [place] to further evaluate.       Relevant Orders   US Abdomen Complete       Follow up plan: Return in about 1 year (around 01/04/2016), or as needed, for annual exam, prior fasting for blood work.

## 2015-01-04 NOTE — Assessment & Plan Note (Signed)
Persistent over last few years. Discussed with patient - will check periph smear and LDH today.

## 2015-01-04 NOTE — Assessment & Plan Note (Signed)
Preventative protocols reviewed and updated unless pt declined. Discussed healthy diet and lifestyle.  

## 2015-01-04 NOTE — Assessment & Plan Note (Signed)
Pt never returned for periph smear last year. WBC normal today. See below.

## 2015-01-04 NOTE — Assessment & Plan Note (Signed)
Encouraged regular exercise at gym, avoiding added sugars/candy.

## 2015-01-04 NOTE — Assessment & Plan Note (Signed)
Per pt longstanding masses in abdomen - palpable masses LUQ and R side of abdomen. Will start with abd [place] to further evaluate.

## 2015-01-04 NOTE — Patient Instructions (Addendum)
Labwork today. Pass by Rosaria Ferries or Allison's office to schedule abdominal ultrasound Good to see you today, call us with questions  Health Maintenance Adopting a healthy lifestyle and getting preventive care can go a long way to promote health and wellness. Talk with your health care provider about what schedule of regular examinations is right for you. This is a good chance for you to check in with your provider about disease prevention and staying healthy. In between checkups, there are plenty of things you can do on your own. Experts have done a lot of research about which lifestyle changes and preventive measures are most likely to keep you healthy. Ask your health care provider for more information. WEIGHT AND DIET  Eat a healthy diet  Be sure to include plenty of vegetables, fruits, low-fat dairy products, and lean protein.  Do not eat a lot of foods high in solid fats, added sugars, or salt.  Get regular exercise. This is one of the most important things you can do for your health.  Most adults should exercise for at least 150 minutes each week. The exercise should increase your heart rate and make you sweat (moderate-intensity exercise).  Most adults should also do strengthening exercises at least twice a week. This is in addition to the moderate-intensity exercise.  Maintain a healthy weight  Body mass index (BMI) is a measurement that can be used to identify possible weight problems. It estimates body fat based on height and weight. Your health care provider can help determine your BMI and help you achieve or maintain a healthy weight.  For females 81 years of age and older:   A BMI below 18.5 is considered underweight.  A BMI of 18.5 to 24.9 is normal.  A BMI of 25 to 29.9 is considered overweight.  A BMI of 30 and above is considered obese.  Watch levels of cholesterol and blood lipids  You should start having your blood tested for lipids and cholesterol at 47 years of  age, then have this test every 5 years.  You may need to have your cholesterol levels checked more often if:  Your lipid or cholesterol levels are high.  You are older than 47 years of age.  You are at high risk for heart disease.  CANCER SCREENING   Lung Cancer  Lung cancer screening is recommended for adults 29-10 years old who are at high risk for lung cancer because of a history of smoking.  A yearly low-dose CT scan of the lungs is recommended for people who:  Currently smoke.  Have quit within the past 15 years.  Have at least a 30-pack-year history of smoking. A pack year is smoking an average of one pack of cigarettes a day for 1 year.  Yearly screening should continue until it has been 15 years since you quit.  Yearly screening should stop if you develop a health problem that would prevent you from having lung cancer treatment.  Breast Cancer  Practice breast self-awareness. This means understanding how your breasts normally appear and feel.  It also means doing regular breast self-exams. Let your health care provider know about any changes, no matter how small.  If you are in your 20s or 30s, you should have a clinical breast exam (CBE) by a health care provider every 1-3 years as part of a regular health exam.  If you are 64 or older, have a CBE every year. Also consider having a breast X-ray (mammogram) every year.  If you have a family history of breast cancer, talk to your health care provider about genetic screening.  If you are at high risk for breast cancer, talk to your health care provider about having an MRI and a mammogram every year.  Breast cancer gene (BRCA) assessment is recommended for women who have family members with BRCA-related cancers. BRCA-related cancers include:  Breast.  Ovarian.  Tubal.  Peritoneal cancers.  Results of the assessment will determine the need for genetic counseling and BRCA1 and BRCA2 testing. Cervical  Cancer Routine pelvic examinations to screen for cervical cancer are no longer recommended for nonpregnant women who are considered low risk for cancer of the pelvic organs (ovaries, uterus, and vagina) and who do not have symptoms. A pelvic examination may be necessary if you have symptoms including those associated with pelvic infections. Ask your health care provider if a screening pelvic exam is right for you.   The Pap test is the screening test for cervical cancer for women who are considered at risk.  If you had a hysterectomy for a problem that was not cancer or a condition that could lead to cancer, then you no longer need Pap tests.  If you are older than 65 years, and you have had normal Pap tests for the past 10 years, you no longer need to have Pap tests.  If you have had past treatment for cervical cancer or a condition that could lead to cancer, you need Pap tests and screening for cancer for at least 20 years after your treatment.  If you no longer get a Pap test, assess your risk factors if they change (such as having a new sexual partner). This can affect whether you should start being screened again.  Some women have medical problems that increase their chance of getting cervical cancer. If this is the case for you, your health care provider may recommend more frequent screening and Pap tests.  The human papillomavirus (HPV) test is another test that may be used for cervical cancer screening. The HPV test looks for the virus that can cause cell changes in the cervix. The cells collected during the Pap test can be tested for HPV.  The HPV test can be used to screen women 16 years of age and older. Getting tested for HPV can extend the interval between normal Pap tests from three to five years.  An HPV test also should be used to screen women of any age who have unclear Pap test results.  After 47 years of age, women should have HPV testing as often as Pap tests.  Colorectal  Cancer  This type of cancer can be detected and often prevented.  Routine colorectal cancer screening usually begins at 47 years of age and continues through 47 years of age.  Your health care provider may recommend screening at an earlier age if you have risk factors for colon cancer.  Your health care provider may also recommend using home test kits to check for hidden blood in the stool.  A small camera at the end of a tube can be used to examine your colon directly (sigmoidoscopy or colonoscopy). This is done to check for the earliest forms of colorectal cancer.  Routine screening usually begins at age 12.  Direct examination of the colon should be repeated every 5-10 years through 47 years of age. However, you may need to be screened more often if early forms of precancerous polyps or small growths are found. Skin Cancer  Check your skin from head to toe regularly.  Tell your health care provider about any new moles or changes in moles, especially if there is a change in a mole's shape or color.  Also tell your health care provider if you have a mole that is larger than the size of a pencil eraser.  Always use sunscreen. Apply sunscreen liberally and repeatedly throughout the day.  Protect yourself by wearing long sleeves, pants, a wide-brimmed hat, and sunglasses whenever you are outside. HEART DISEASE, DIABETES, AND HIGH BLOOD PRESSURE   Have your blood pressure checked at least every 1-2 years. High blood pressure causes heart disease and increases the risk of stroke.  If you are between 40 years and 83 years old, ask your health care provider if you should take aspirin to prevent strokes.  Have regular diabetes screenings. This involves taking a blood sample to check your fasting blood sugar level.  If you are at a normal weight and have a low risk for diabetes, have this test once every three years after 47 years of age.  If you are overweight and have a high risk for  diabetes, consider being tested at a younger age or more often. PREVENTING INFECTION  Hepatitis B  If you have a higher risk for hepatitis B, you should be screened for this virus. You are considered at high risk for hepatitis B if:  You were born in a country where hepatitis B is common. Ask your health care provider which countries are considered high risk.  Your parents were born in a high-risk country, and you have not been immunized against hepatitis B (hepatitis B vaccine).  You have HIV or AIDS.  You use needles to inject street drugs.  You live with someone who has hepatitis B.  You have had sex with someone who has hepatitis B.  You get hemodialysis treatment.  You take certain medicines for conditions, including cancer, organ transplantation, and autoimmune conditions. Hepatitis C  Blood testing is recommended for:  Everyone born from [birth year] through 1963.  Anyone with known risk factors for hepatitis C. Sexually transmitted infections (STIs)  You should be screened for sexually transmitted infections (STIs) including gonorrhea and chlamydia if:  You are sexually active and are younger than 47 years of age.  You are older than 47 years of age and your health care provider tells you that you are at risk for this type of infection.  Your sexual activity has changed since you were last screened and you are at an increased risk for chlamydia or gonorrhea. Ask your health care provider if you are at risk.  If you do not have HIV, but are at risk, it may be recommended that you take a prescription medicine daily to prevent HIV infection. This is called pre-exposure prophylaxis (PrEP). You are considered at risk if:  You are sexually active and do not regularly use condoms or know the HIV status of your partner(s).  You take drugs by injection.  You are sexually active with a partner who has HIV. Talk with your health care provider about whether you are at high risk of  being infected with HIV. If you choose to begin PrEP, you should first be tested for HIV. You should then be tested every 3 months for as long as you are taking PrEP.  PREGNANCY   If you are premenopausal and you may become pregnant, ask your health care provider about preconception counseling.  If you may become pregnant,  take 400 to 800 micrograms (mcg) of folic acid every day.  If you want to prevent pregnancy, talk to your health care provider about birth control (contraception). OSTEOPOROSIS AND MENOPAUSE   Osteoporosis is a disease in which the bones lose minerals and strength with aging. This can result in serious bone fractures. Your risk for osteoporosis can be identified using a bone density scan.  If you are 39 years of age or older, or if you are at risk for osteoporosis and fractures, ask your health care provider if you should be screened.  Ask your health care provider whether you should take a calcium or vitamin D supplement to lower your risk for osteoporosis.  Menopause may have certain physical symptoms and risks.  Hormone replacement therapy may reduce some of these symptoms and risks. Talk to your health care provider about whether hormone replacement therapy is right for you.  HOME CARE INSTRUCTIONS   Schedule regular health, dental, and eye exams.  Stay current with your immunizations.   Do not use any tobacco products including cigarettes, chewing tobacco, or electronic cigarettes.  If you are pregnant, do not drink alcohol.  If you are breastfeeding, limit how much and how often you drink alcohol.  Limit alcohol intake to no more than 1 drink per day for nonpregnant women. One drink equals 12 ounces of beer, 5 ounces of wine, or 1 ounces of hard liquor.  Do not use street drugs.  Do not share needles.  Ask your health care provider for help if you need support or information about quitting drugs.  Tell your health care provider if you often feel  depressed.  Tell your health care provider if you have ever been abused or do not feel safe at home. Document Released: 11/03/2010 Document Revised: 09/04/2013 Document Reviewed: 03/22/2013 [facility] Patient Information 2015 [place], [place]. This information is not intended to replace advice given to you by your health care provider. Make sure you discuss any questions you have with your health care provider.

## 2015-01-04 NOTE — Assessment & Plan Note (Signed)
Chronic, stable.  Continue amlodipine 10 mg daily 

## 2015-01-04 NOTE — Assessment & Plan Note (Signed)
Had ablation years ago, no problem since. Followed by OBGYN.

## 2015-01-05 LAB — LACTATE DEHYDROGENASE: LDH: 141 U/L (ref 94–250)

## 2015-01-08 LAB — PATHOLOGIST SMEAR REVIEW

## 2015-01-18 ENCOUNTER — Ambulatory Visit
Admission: RE | Admit: 2015-01-18 | Discharge: 2015-01-18 | Disposition: A | Discharge status: Home / Self Care | Payer: Federal, State, Local not specified - PPO | Source: Ambulatory Visit | Attending: Family Medicine | Admitting: Family Medicine

## 2015-01-18 DIAGNOSIS — R1902 Left upper quadrant abdominal swelling, mass and lump: Secondary | ICD-10-CM

## 2015-01-19 ENCOUNTER — Encounter: Payer: Self-pay | Admitting: Family Medicine

## 2015-01-19 ENCOUNTER — Other Ambulatory Visit: Payer: Self-pay | Admitting: Family Medicine

## 2015-01-19 DIAGNOSIS — D72820 Lymphocytosis (symptomatic): Secondary | ICD-10-CM

## 2015-01-19 DIAGNOSIS — K760 Fatty (change of) liver, not elsewhere classified: Secondary | ICD-10-CM

## 2015-05-23 ENCOUNTER — Telehealth: Payer: Self-pay | Admitting: Family Medicine

## 2015-05-23 NOTE — Telephone Encounter (Signed)
Pt dropped off for to do an excersice program at her place of work. Please fill out and call [phone] when ready to pick up  Placing in tower  Thank you

## 2015-05-24 NOTE — Telephone Encounter (Signed)
In your IN box for completion.  

## 2015-05-24 NOTE — Telephone Encounter (Signed)
Filled and in Kim's box. Also remind to come at her convenience for f/u labwork.

## 2015-05-24 NOTE — Telephone Encounter (Signed)
Patient notified and lab appt scheduled.  

## 2015-05-28 ENCOUNTER — Other Ambulatory Visit (INDEPENDENT_AMBULATORY_CARE_PROVIDER_SITE_OTHER): Payer: Federal, State, Local not specified - PPO

## 2015-05-28 DIAGNOSIS — D72820 Lymphocytosis (symptomatic): Secondary | ICD-10-CM | POA: Diagnosis not present

## 2015-05-28 DIAGNOSIS — K760 Fatty (change of) liver, not elsewhere classified: Secondary | ICD-10-CM

## 2015-05-28 LAB — CBC WITH DIFFERENTIAL/PLATELET
BASOS ABS: 0 10*3/uL (ref 0.0–0.1)
Basophils Relative: 0.6 % (ref 0.0–3.0)
EOS ABS: 0.1 10*3/uL (ref 0.0–0.7)
Eosinophils Relative: 2 % (ref 0.0–5.0)
HCT: 43 % (ref 36.0–46.0)
Hemoglobin: 14.2 g/dL (ref 12.0–15.0)
LYMPHS ABS: 2.7 10*3/uL (ref 0.7–4.0)
Lymphocytes Relative: 60.2 % — ABNORMAL HIGH (ref 12.0–46.0)
MCHC: 32.9 g/dL (ref 30.0–36.0)
MCV: 90.6 fl (ref 78.0–100.0)
MONO ABS: 0.4 10*3/uL (ref 0.1–1.0)
Monocytes Relative: 9.1 % (ref 3.0–12.0)
NEUTROS ABS: 1.3 10*3/uL — AB (ref 1.4–7.7)
NEUTROS PCT: 28.1 % — AB (ref 43.0–77.0)
PLATELETS: 244 10*3/uL (ref 150.0–400.0)
RBC: 4.75 Mil/uL (ref 3.87–5.11)
RDW: 12.8 % (ref 11.5–15.5)
WBC: 4.5 10*3/uL (ref 4.0–10.5)

## 2015-05-28 LAB — HEPATIC FUNCTION PANEL
ALBUMIN: 4.3 g/dL (ref 3.5–5.2)
ALK PHOS: 39 U/L (ref 39–117)
ALT: 18 U/L (ref 0–35)
AST: 19 U/L (ref 0–37)
BILIRUBIN DIRECT: 0 mg/dL (ref 0.0–0.3)
TOTAL PROTEIN: 7.7 g/dL (ref 6.0–8.3)
Total Bilirubin: 0.4 mg/dL (ref 0.2–1.2)

## 2015-05-30 LAB — PROTEIN ELECTROPHORESIS, SERUM, WITH REFLEX
Albumin ELP: 4.4 g/dL (ref 3.8–4.8)
Alpha-1-Globulin: 0.2 g/dL (ref 0.2–0.3)
Alpha-2-Globulin: 0.6 g/dL (ref 0.5–0.9)
BETA 2: 0.4 g/dL (ref 0.2–0.5)
BETA GLOBULIN: 0.4 g/dL (ref 0.4–0.6)
GAMMA GLOBULIN: 1.6 g/dL (ref 0.8–1.7)
Total Protein, Serum Electrophoresis: 7.5 g/dL (ref 6.1–8.1)

## 2015-06-01 ENCOUNTER — Encounter: Payer: Self-pay | Admitting: Family Medicine

## 2015-12-03 LAB — HM MAMMOGRAPHY: HM MAMMO: NORMAL (ref 0–4)

## 2015-12-29 ENCOUNTER — Other Ambulatory Visit: Payer: Self-pay | Admitting: Family Medicine

## 2015-12-29 DIAGNOSIS — K760 Fatty (change of) liver, not elsewhere classified: Secondary | ICD-10-CM

## 2015-12-29 DIAGNOSIS — D709 Neutropenia, unspecified: Secondary | ICD-10-CM

## 2015-12-29 DIAGNOSIS — E559 Vitamin D deficiency, unspecified: Secondary | ICD-10-CM

## 2015-12-29 DIAGNOSIS — K824 Cholesterolosis of gallbladder: Secondary | ICD-10-CM

## 2015-12-29 DIAGNOSIS — C44599 Other specified malignant neoplasm of skin of other part of trunk: Secondary | ICD-10-CM

## 2015-12-29 DIAGNOSIS — I10 Essential (primary) hypertension: Secondary | ICD-10-CM

## 2015-12-30 LAB — HM PAP SMEAR: HM Pap smear: NORMAL

## 2015-12-31 ENCOUNTER — Other Ambulatory Visit: Payer: Federal, State, Local not specified - PPO

## 2016-01-07 ENCOUNTER — Encounter: Payer: Federal, State, Local not specified - PPO | Admitting: Family Medicine

## 2016-02-09 IMAGING — US US ABDOMEN COMPLETE
1 series · 13 of 25 positions shown · non-contrast
Comparison: None.

CLINICAL DATA: Left upper quadrant mass

EXAM:
ULTRASOUND ABDOMEN COMPLETE

[Series 1: us abdomen complete · 0.32mm/px · 13 of 81 slices shown]
[im 1/81]
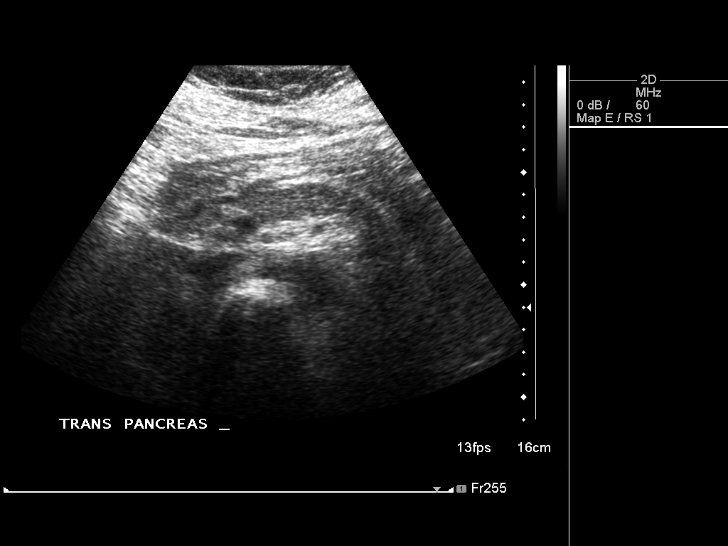
[im 7/81]
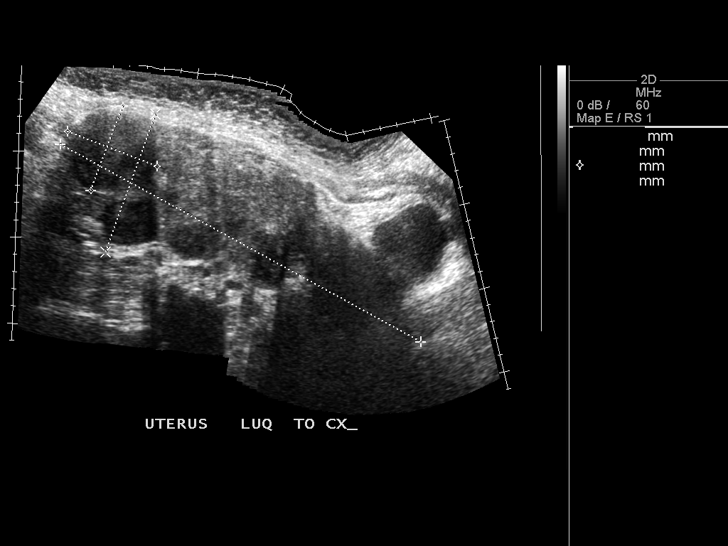
[im 14/81]
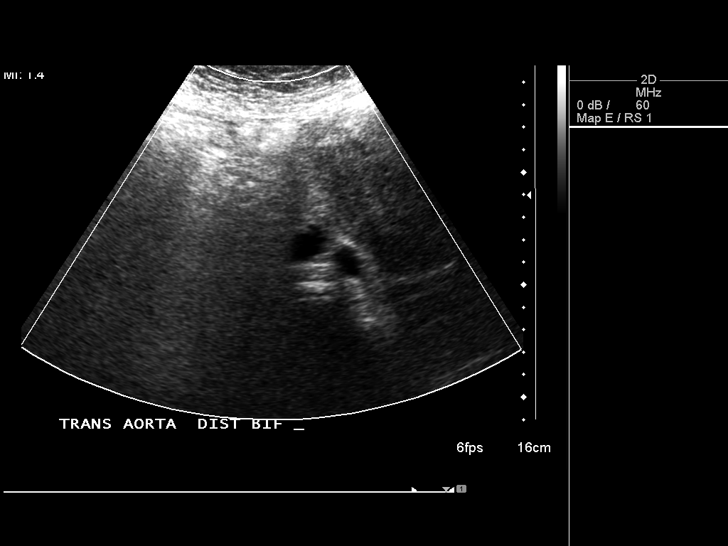
[im 21/81]
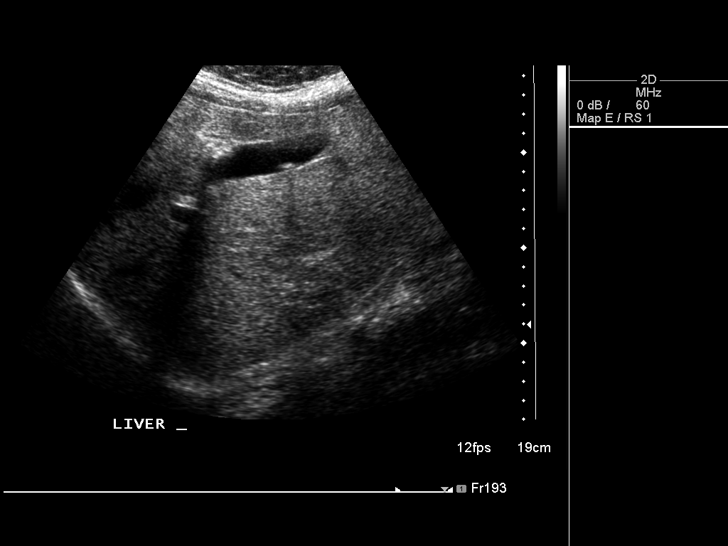
[im 27/81]
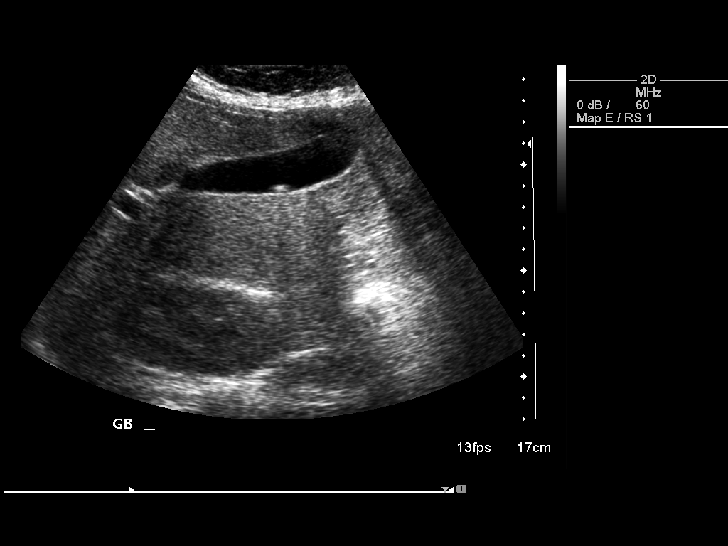
[im 34/81]
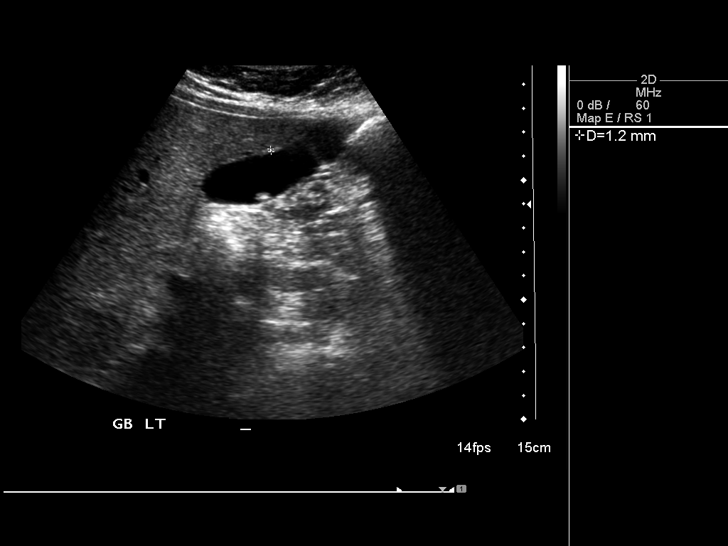
[im 41/81]
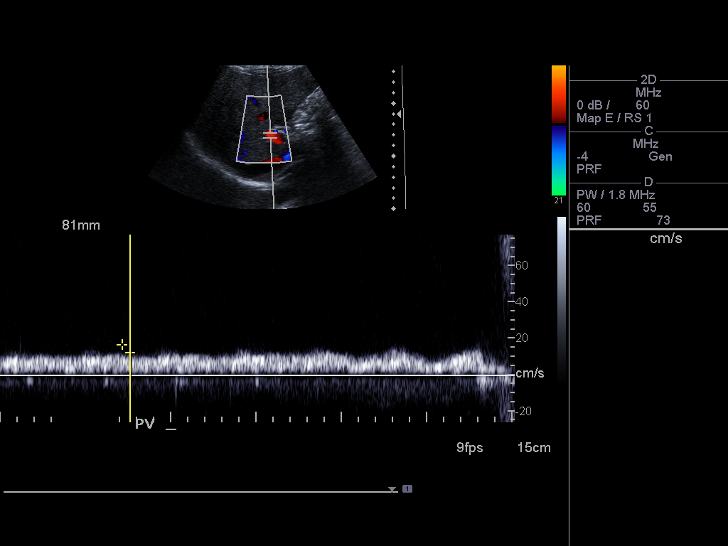
[im 47/81]
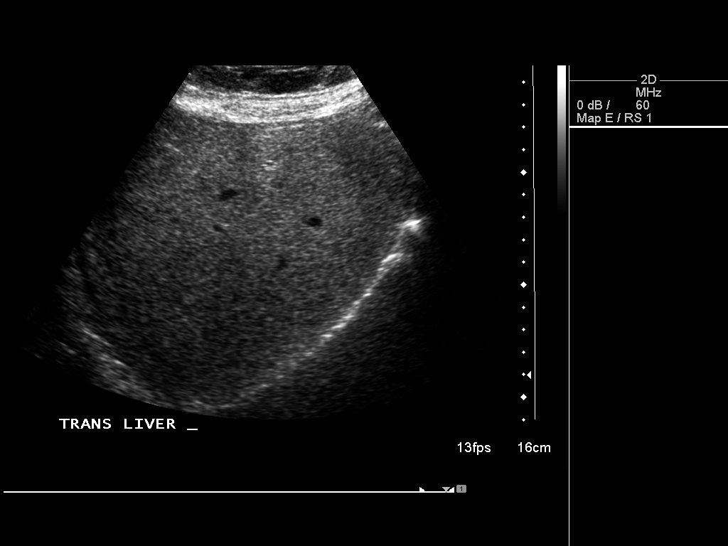
[im 54/81]
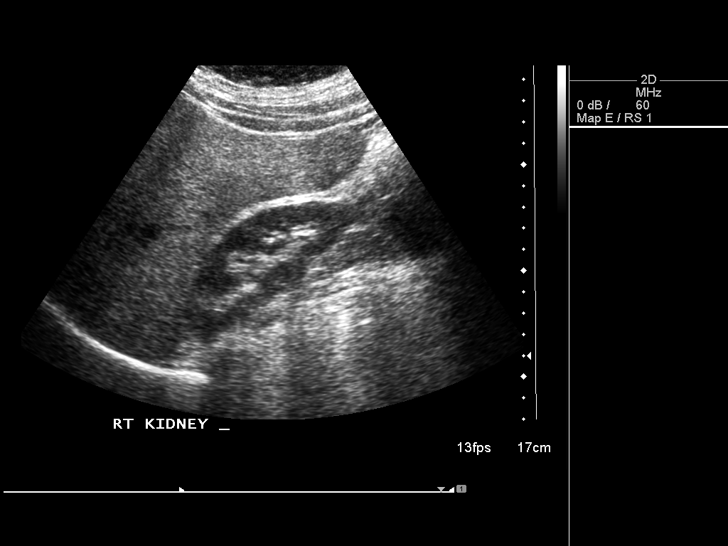
[im 61/81]
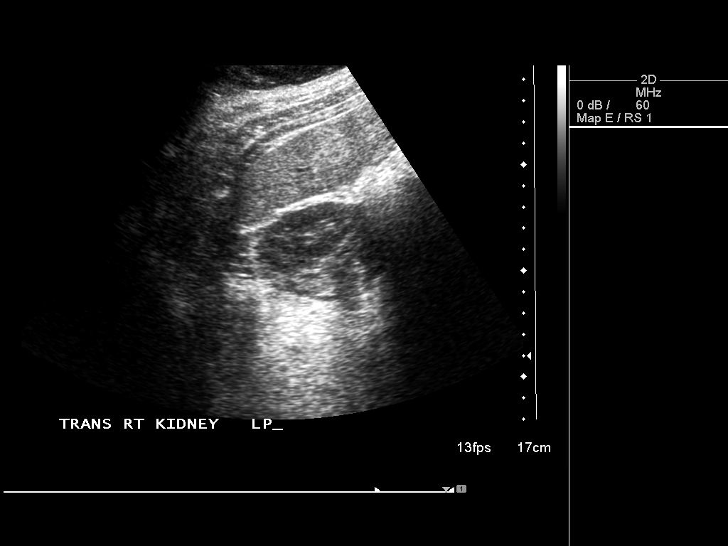
[im 67/81]
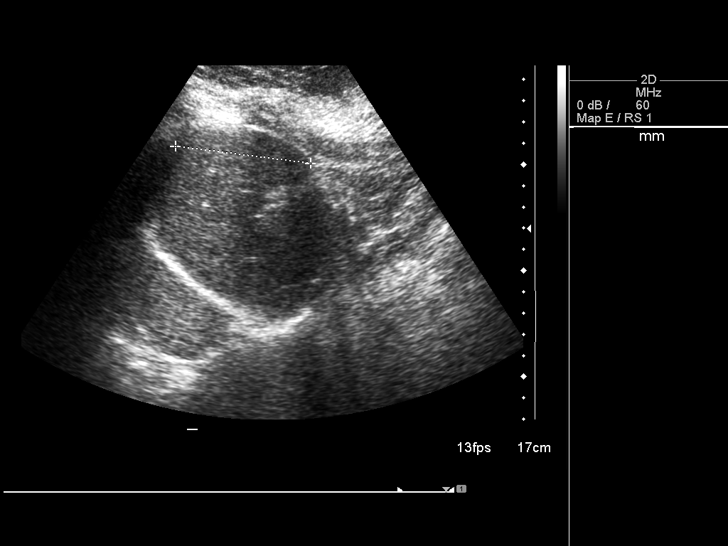
[im 74/81]
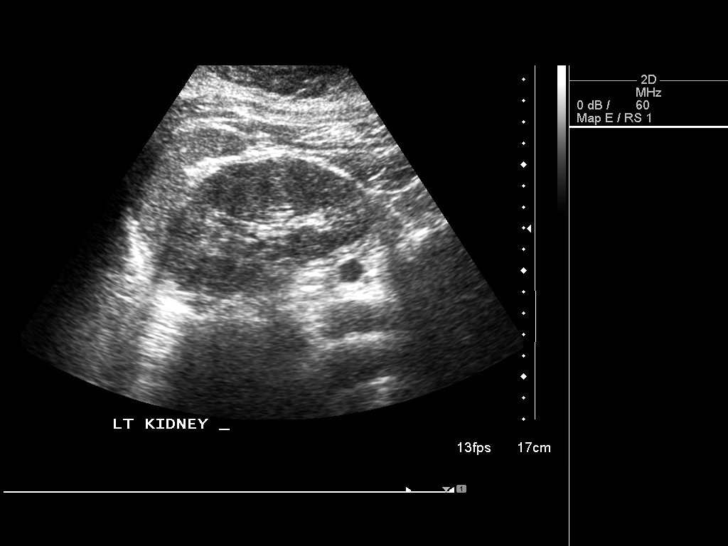
[im 81/81]
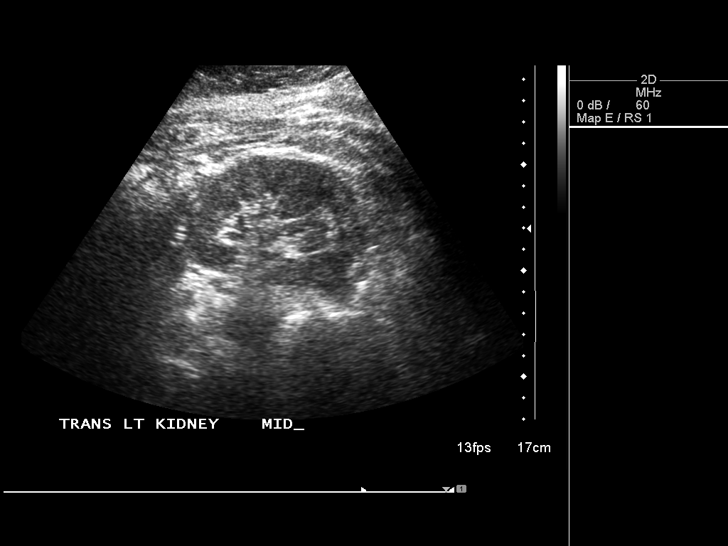

[13 of 25 positions shown; findings below may reference images not displayed]

FINDINGS: Gallbladder: No gallstones. No Murphy sign. No wall thickening. A 6
mm polyp is identified.

Common bile duct: Diameter: 4 mm

Liver: Diffusely increased in echogenicity without focal mass.

IVC: No abnormality visualized.

Pancreas: Visualized portion unremarkable.

Spleen: Size and appearance within normal limits.

Right Kidney: Length: 10.2 cm. Echogenicity within normal limits. No
mass or hydronephrosis visualized.

Left Kidney: Length: 11.2 cm. Echogenicity within normal limits. No
mass or hydronephrosis visualized.

Abdominal aorta: Maximal diameter is 2.8 cm.

Other findings: The uterus is markedly enlarged. It extends into the
upper abdomen. A lobulated exophytic fibroid is in the upper abdomen
and may account for the palpable abnormality.
IMPRESSION: The uterus is markedly enlarged and contains fibroids. This likely
accounts for the palpable abnormality. Dedicated pelvic ultrasound
is recommended.

6 mm gallbladder polyp is likely benign.

Maximal diameter of the aorta is 2.8 cm. Ectatic abdominal aorta at
risk for aneurysm development. Recommend followup by ultrasound in 5
years. This recommendation follows ACR consensus guidelines: White
Paper of the ACR Incidental Findings Committee II on Vascular
Findings. [HOSPITAL] 3623; [DATE].

Diffuse hepatic steatosis.

## 2016-02-27 ENCOUNTER — Other Ambulatory Visit: Payer: Self-pay | Admitting: Family Medicine

## 2016-03-19 ENCOUNTER — Other Ambulatory Visit: Payer: Federal, State, Local not specified - PPO

## 2016-03-25 ENCOUNTER — Encounter: Payer: Self-pay | Admitting: Family Medicine

## 2016-03-25 ENCOUNTER — Other Ambulatory Visit: Payer: Self-pay | Admitting: *Deleted

## 2016-03-25 ENCOUNTER — Ambulatory Visit (INDEPENDENT_AMBULATORY_CARE_PROVIDER_SITE_OTHER): Payer: Federal, State, Local not specified - PPO | Admitting: Family Medicine

## 2016-03-25 VITALS — BP 180/100 | HR 88 | Temp 98.0°F | Ht 66.0 in | Wt 203.8 lb

## 2016-03-25 DIAGNOSIS — I10 Essential (primary) hypertension: Secondary | ICD-10-CM

## 2016-03-25 DIAGNOSIS — D259 Leiomyoma of uterus, unspecified: Secondary | ICD-10-CM | POA: Diagnosis not present

## 2016-03-25 DIAGNOSIS — C44599 Other specified malignant neoplasm of skin of other part of trunk: Secondary | ICD-10-CM

## 2016-03-25 DIAGNOSIS — E669 Obesity, unspecified: Secondary | ICD-10-CM | POA: Diagnosis not present

## 2016-03-25 DIAGNOSIS — K760 Fatty (change of) liver, not elsewhere classified: Secondary | ICD-10-CM

## 2016-03-25 DIAGNOSIS — E559 Vitamin D deficiency, unspecified: Secondary | ICD-10-CM

## 2016-03-25 DIAGNOSIS — Z0000 Encounter for general adult medical examination without abnormal findings: Secondary | ICD-10-CM | POA: Diagnosis not present

## 2016-03-25 DIAGNOSIS — D709 Neutropenia, unspecified: Secondary | ICD-10-CM

## 2016-03-25 DIAGNOSIS — R1902 Left upper quadrant abdominal swelling, mass and lump: Secondary | ICD-10-CM

## 2016-03-25 DIAGNOSIS — M25551 Pain in right hip: Secondary | ICD-10-CM

## 2016-03-25 LAB — COMPREHENSIVE METABOLIC PANEL
ALT: 16 U/L (ref 0–35)
AST: 19 U/L (ref 0–37)
Albumin: 4.5 g/dL (ref 3.5–5.2)
Alkaline Phosphatase: 36 U/L — ABNORMAL LOW (ref 39–117)
BILIRUBIN TOTAL: 0.6 mg/dL (ref 0.2–1.2)
BUN: 11 mg/dL (ref 6–23)
CALCIUM: 9.5 mg/dL (ref 8.4–10.5)
CHLORIDE: 103 meq/L (ref 96–112)
CO2: 30 meq/L (ref 19–32)
Creatinine, Ser: 0.91 mg/dL (ref 0.40–1.20)
GFR: 84.78 mL/min (ref >60.00)
Glucose, Bld: 92 mg/dL (ref 70–99)
Potassium: 3.8 mEq/L (ref 3.5–5.1)
Sodium: 141 mEq/L (ref 135–145)
Total Protein: 7.6 g/dL (ref 6.0–8.3)

## 2016-03-25 LAB — TSH: TSH: 2.07 u[IU]/mL (ref 0.35–4.50)

## 2016-03-25 LAB — LIPID PANEL
Cholesterol: 187 mg/dL (ref 0–200)
HDL: 66.2 mg/dL (ref >39.00)
LDL Cholesterol: 101 mg/dL — ABNORMAL HIGH (ref 0–99)
NonHDL: 120.82
TRIGLYCERIDES: 99 mg/dL (ref 0.0–149.0)
Total CHOL/HDL Ratio: 3
VLDL: 19.8 mg/dL (ref 0.0–40.0)

## 2016-03-25 LAB — CBC WITH DIFFERENTIAL/PLATELET
BASOS PCT: 1 % (ref 0.0–3.0)
Basophils Absolute: 0 10*3/uL (ref 0.0–0.1)
EOS PCT: 2.6 % (ref 0.0–5.0)
Eosinophils Absolute: 0.1 10*3/uL (ref 0.0–0.7)
HEMATOCRIT: 41.5 % (ref 36.0–46.0)
Hemoglobin: 14.1 g/dL (ref 12.0–15.0)
Lymphs Abs: 2.1 10*3/uL (ref 0.7–4.0)
MCHC: 33.9 g/dL (ref 30.0–36.0)
MCV: 87.8 fl (ref 78.0–100.0)
MONOS PCT: 8.6 % (ref 3.0–12.0)
Monocytes Absolute: 0.3 10*3/uL (ref 0.1–1.0)
NEUTROS ABS: 1.1 10*3/uL — AB (ref 1.4–7.7)
Neutrophils Relative %: 30.4 % — ABNORMAL LOW (ref 43.0–77.0)
PLATELETS: 243 10*3/uL (ref 150.0–400.0)
RBC: 4.73 Mil/uL (ref 3.87–5.11)
RDW: 12.5 % (ref 11.5–15.5)
WBC: 3.7 10*3/uL — ABNORMAL LOW (ref 4.0–10.5)

## 2016-03-25 LAB — MICROALBUMIN / CREATININE URINE RATIO
CREATININE, U: 79.8 mg/dL
MICROALB UR: 0.7 mg/dL (ref 0.0–1.9)
MICROALB/CREAT RATIO: 0.9 mg/g (ref 0.0–30.0)

## 2016-03-25 LAB — VITAMIN D 25 HYDROXY (VIT D DEFICIENCY, FRACTURES): VITD: 20.71 ng/mL — AB (ref 30.00–100.00)

## 2016-03-25 MED ORDER — LEVOCETIRIZINE DIHYDROCHLORIDE 5 MG PO TABS: 5.0000 mg | ORAL_TABLET | Freq: Every evening | ORAL | 11 refills | Status: DC

## 2016-03-25 NOTE — Assessment & Plan Note (Signed)
Update CBC. 

## 2016-03-25 NOTE — Assessment & Plan Note (Addendum)
Likely enlarged fibroid uterus

## 2016-03-25 NOTE — Assessment & Plan Note (Signed)
Reviewed dx with patient, handout provided.

## 2016-03-25 NOTE — Assessment & Plan Note (Signed)
Preventative protocols reviewed and updated unless pt declined. Discussed healthy diet and lifestyle.  

## 2016-03-25 NOTE — Assessment & Plan Note (Signed)
Update vit D level.  

## 2016-03-25 NOTE — Assessment & Plan Note (Signed)
Pt has declined further eval at this time.

## 2016-03-25 NOTE — Assessment & Plan Note (Signed)
Followed by OBGYN. As no anemia, they plan to monitor this.

## 2016-03-25 NOTE — Progress Notes (Signed)
BP (!) 180/100 (BP Location: Right Arm, Cuff Size: Large)   Pulse 88   Temp 98 F (36.7 C) (Oral)   Ht 5\' 6"  (1.676 m)   Wt 203 lb 12 oz (92.4 kg)   LMP 03/14/2016   BMI 32.89 kg/m    CC: CPE Subjective:    Patient ID: [name], female    DOB: 04-21-1966, 50 y.o.   MRN: [id number]  HPI: ERMALINE HONAKER is a 50 y.o. female presenting on 03/25/2016 for Annual Exam   She has not taken bp meds yet today. bp markedly elevated today. No HA, vision changes, CP/tightness, SOB, leg swelling.   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   Palpable abdominal mass on exam 2016 - abd US showed markedly enlarged uterus with exophytic fibroid. It also showed fatty liver and 10mm gallbladder polyp. She also had ectatic abd aorta, rec f/u 5 yrs.   Also - noticing some R lateral hip pain present over last several weeks. She regularly uses eliptical  Preventative: Well woman with OBGYN (Dr Matthew Saras at [facility] [place]) - 12/2015 (normal pap per patient), normal mammogram per patient. H/o endometrial ablation. Aware of and monitoring fibroids.  LMP - coming off period, regular periods, not heavy bleeding.  Flu - declines Tdap - declines Seat belt use discussed Sunscreen use discussed. No changing moles on skin. Non smoker Alcohol - occasionally  Caffeine: 1 cup coffee/day Lives with husband, 3 sons Occupation: works for [employer], started going back to school Activity: going to gym 3x/wk - cardio  Diet: good water, fruits and vegetables daily   Relevant past medical, surgical, family and social history reviewed and updated as indicated. Interim medical history since our last visit reviewed. Allergies and medications reviewed and updated. Current Outpatient Prescriptions on File Prior to Visit  Medication Sig  .  amLODipine (NORVASC) 10 MG tablet TAKE ONE TABLET BY MOUTH ONCE DAILY  . cholecalciferol (VITAMIN D) 1000 UNITS tablet Take 1,000 Units by mouth daily.  . fluticasone (FLONASE) 50 MCG/ACT nasal spray Place 2 sprays into both nostrils daily.  Marland Kitchen levocetirizine (XYZAL) 5 MG tablet Take 1 tablet (5 mg total) by mouth every evening.   No current facility-administered medications on file prior to visit.     Review of Systems  Constitutional: Negative for activity change, appetite change, chills, fatigue, fever and unexpected weight change.  HENT: Negative for hearing loss.   Eyes: Negative for visual disturbance.  Respiratory: Negative for cough, chest tightness, shortness of breath and wheezing.   Cardiovascular: Negative for chest pain, palpitations and leg swelling.  Gastrointestinal: Negative for abdominal distention, abdominal pain, blood in stool, constipation, diarrhea, nausea and vomiting.  Genitourinary: Negative for difficulty urinating and hematuria.  Musculoskeletal: Negative for arthralgias, myalgias and neck pain.  Skin: Negative for rash.  Neurological: Negative for dizziness, seizures, syncope and headaches.  Hematological: Negative for adenopathy. Does not bruise/bleed easily.  Psychiatric/Behavioral: Negative for dysphoric mood. The patient is not nervous/anxious.    Per HPI unless specifically indicated in ROS section     Objective:    BP (!) 180/100 (BP Location: Right Arm, Cuff Size: Large)   Pulse 88   Temp 98 F (36.7 C) (Oral)   Ht 5\' 6"  (1.676 m)   Wt 203 lb 12 oz (92.4 kg)   LMP 03/14/2016   BMI  32.89 kg/m   Wt Readings from Last 3 Encounters:  03/25/16 203 lb 12 oz (92.4 kg)  01/04/15 208 lb 4 oz (94.5 kg)  07/16/14 213 lb 8 oz (96.8 kg)    Physical Exam  Constitutional: She is oriented to person, place, and time. She appears well-developed and well-nourished. No distress.  HENT:  Head: Normocephalic and atraumatic.  Right Ear: Hearing, tympanic  membrane, external ear and ear canal normal.  Left Ear: Hearing, tympanic membrane, external ear and ear canal normal.  Nose: Nose normal.  Mouth/Throat: Uvula is midline, oropharynx is clear and moist and mucous membranes are normal. No oropharyngeal exudate, posterior oropharyngeal edema or posterior oropharyngeal erythema.  Eyes: Conjunctivae and EOM are normal. Pupils are equal, round, and reactive to light. No scleral icterus.  Neck: Normal range of motion. Neck supple. No thyromegaly present.  Cardiovascular: Normal rate, regular rhythm, normal heart sounds and intact distal pulses.   No murmur heard. Pulses:      Radial pulses are 2+ on the right side, and 2+ on the left side.  Pulmonary/Chest: Effort normal and breath sounds normal. No respiratory distress. She has no wheezes. She has no rales.  Abdominal: Soft. Bowel sounds are normal. She exhibits mass (large palpable mid abdominal mass). She exhibits no distension. There is no tenderness. There is no rebound and no guarding.  Musculoskeletal: Normal range of motion. She exhibits no edema.  No pain with rotation of hip joint. Some tenderness to palpation present at R GTB.  Lymphadenopathy:    She has no cervical adenopathy.  Neurological: She is alert and oriented to person, place, and time.  CN grossly intact, station and gait intact  Skin: Skin is warm and dry. No rash noted.  Psychiatric: She has a normal mood and affect. Her behavior is normal. Judgment and thought content normal.  Nursing note and vitals reviewed.  Results for orders placed or performed in visit on 03/25/16  HM MAMMOGRAPHY  Result Value Ref Range   HM Mammogram Self Reported Normal 0-4 Bi-Rad, Self Reported Normal      Assessment & Plan:   Problem List Items Addressed This Visit    Abdominal mass, left upper quadrant    Likely enlarged fibroid uterus      Dermatofibrosarcoma protuberans of pubic region    Pt has declined further eval at this time.       Essential hypertension, benign    Chronic, markedly elevated today - pt has not taken medication yet. She will take amlodipine 10mg  right when she gets to work today. She will start monitoring blood pressures at home and notify me if persistently elevated. RTC 1 mo f/u visit HTN. She already avoids salt in diet.       Relevant Orders   TSH   Microalbumin / creatinine urine ratio   Fatty liver    Reviewed dx with patient, handout provided.       Relevant Orders   Comprehensive metabolic panel   Health maintenance examination - Primary    Preventative protocols reviewed and updated unless pt declined. Discussed healthy diet and lifestyle.       Neutropenia ([place])    Update CBC.       Relevant Orders   CBC with Differential/Platelet   Obesity, Class I, BMI 30-34.9    Discussed healthy diet and lifestyle changes to affect sustainable weight loss.      Relevant Orders   Lipid panel   Right hip pain  Anticipate greater trochanteric bursitis. Provided with exercises from [facility] pt advisor. Also recommended reps of lateral leg raises.       Uterine fibroid    Followed by Harle Battiest. As no anemia, they plan to monitor this.      Vitamin D deficiency    Update vit D level.       Relevant Orders   VITAMIN D 25 Hydroxy (Vit-D Deficiency, Fractures)       Follow up plan: Return in about 4 weeks (around 04/22/2016) for follow up visit.  Ria Bush, MD

## 2016-03-25 NOTE — Patient Instructions (Addendum)
Blood pressure too high. Return in 1 month for blood pressure check. Avoid salt in diet. Labs today. We will fax last year's abdominal ultrasound attention Dr Marcelle Overlie.   Fatty Liver Introduction Fatty liver, also called hepatic steatosis or steatohepatitis, is a condition in which too much fat has built up in your liver cells. The liver removes harmful substances from your bloodstream. It produces fluids your body needs. It also helps your body use and store energy from the food you eat. In many cases, fatty liver does not cause symptoms or problems. It is often diagnosed when tests are being done for other reasons. However, over time, fatty liver can cause inflammation that may lead to more serious liver problems, such as scarring of the liver (cirrhosis). What are the causes? Causes of fatty liver may include:  Drinking too much alcohol.  Poor nutrition.  Obesity.  Cushing syndrome.  Diabetes.  Hyperlipidemia.  Pregnancy.  Certain drugs.  Poisons.  Some viral infections. What increases the risk? You may be more likely to develop fatty liver if you:  Abuse alcohol.  Are pregnant.  Are overweight.  Have diabetes.  Have hepatitis.  Have a high triglyceride level. What are the signs or symptoms? Fatty liver often does not cause any symptoms. In cases where symptoms develop, they can include:  Fatigue.  Weakness.  Weight loss.  Confusion.  Abdominal pain.  Yellowing of your skin and the white parts of your eyes (jaundice).  Nausea and vomiting. How is this diagnosed? Fatty liver may be diagnosed by:  Physical exam and medical history.  Blood tests.  Imaging tests, such as an ultrasound, CT scan, or MRI.  Liver biopsy. A small sample of liver tissue is removed using a needle. The sample is then looked at under a microscope. How is this treated? Fatty liver is often caused by other health conditions. Treatment for fatty liver may involve medicines  and lifestyle changes to manage conditions such as:  Alcoholism.  High cholesterol.  Diabetes.  Being overweight or obese. Follow these instructions at home:  Eat a healthy diet as directed by your health care provider.  Exercise regularly. This can help you lose weight and control your cholesterol and diabetes. Talk to your health care provider about an exercise plan and which activities are best for you.  Do not drink alcohol.  Take medicines only as directed by your health care provider. Contact a health care provider if: You have difficulty controlling your:  Blood sugar.  Cholesterol.  Alcohol consumption. Get help right away if:  You have abdominal pain.  You have jaundice.  You have nausea and vomiting. This information is not intended to replace advice given to you by your health care provider. Make sure you discuss any questions you have with your health care provider. Document Released: 06/05/2005 Document Revised: 09/26/2015 Document Reviewed: 08/30/2013  2017 [employer] Health Maintenance, Female Introduction Adopting a healthy lifestyle and getting preventive care can go a long way to promote health and wellness. Talk with your health care provider about what schedule of regular examinations is right for you. This is a good chance for you to check in with your provider about disease prevention and staying healthy. In between checkups, there are plenty of things you can do on your own. Experts have done a lot of research about which lifestyle changes and preventive measures are most likely to keep you healthy. Ask your health care provider for more information. Weight and diet Eat a healthy  diet  Be sure to include plenty of vegetables, fruits, low-fat dairy products, and lean protein.  Do not eat a lot of foods high in solid fats, added sugars, or salt.  Get regular exercise. This is one of the most important things you can do for your health.  Most adults  should exercise for at least 150 minutes each week. The exercise should increase your heart rate and make you sweat (moderate-intensity exercise).  Most adults should also do strengthening exercises at least twice a week. This is in addition to the moderate-intensity exercise. Maintain a healthy weight  Body mass index (BMI) is a measurement that can be used to identify possible weight problems. It estimates body fat based on height and weight. Your health care provider can help determine your BMI and help you achieve or maintain a healthy weight.  For females 82 years of age and older:  A BMI below 18.5 is considered underweight.  A BMI of 18.5 to 24.9 is normal.  A BMI of 25 to 29.9 is considered overweight.  A BMI of 30 and above is considered obese. Watch levels of cholesterol and blood lipids  You should start having your blood tested for lipids and cholesterol at 48 years of age, then have this test every 5 years.  You may need to have your cholesterol levels checked more often if:  Your lipid or cholesterol levels are high.  You are older than 48 years of age.  You are at high risk for heart disease. Cancer screening Lung Cancer  Lung cancer screening is recommended for adults 18-87 years old who are at high risk for lung cancer because of a history of smoking.  A yearly low-dose CT scan of the lungs is recommended for people who:  Currently smoke.  Have quit within the past 15 years.  Have at least a 30-pack-year history of smoking. A pack year is smoking an average of one pack of cigarettes a day for 1 year.  Yearly screening should continue until it has been 15 years since you quit.  Yearly screening should stop if you develop a health problem that would prevent you from having lung cancer treatment. Breast Cancer  Practice breast self-awareness. This means understanding how your breasts normally appear and feel.  It also means doing regular breast self-exams.  Let your health care provider know about any changes, no matter how small.  If you are in your 20s or 30s, you should have a clinical breast exam (CBE) by a health care provider every 1-3 years as part of a regular health exam.  If you are 93 or older, have a CBE every year. Also consider having a breast X-ray (mammogram) every year.  If you have a family history of breast cancer, talk to your health care provider about genetic screening.  If you are at high risk for breast cancer, talk to your health care provider about having an MRI and a mammogram every year.  Breast cancer gene (BRCA) assessment is recommended for women who have family members with BRCA-related cancers. BRCA-related cancers include:  Breast.  Ovarian.  Tubal.  Peritoneal cancers.  Results of the assessment will determine the need for genetic counseling and BRCA1 and BRCA2 testing. Cervical Cancer  Your health care provider may recommend that you be screened regularly for cancer of the pelvic organs (ovaries, uterus, and vagina). This screening involves a pelvic examination, including checking for microscopic changes to the surface of your cervix (Pap test). You  may be encouraged to have this screening done every 3 years, beginning at age 76.  For women ages 77-65, health care providers may recommend pelvic exams and Pap testing every 3 years, or they may recommend the Pap and pelvic exam, combined with testing for human papilloma virus (HPV), every 5 years. Some types of HPV increase your risk of cervical cancer. Testing for HPV may also be done on women of any age with unclear Pap test results.  Other health care providers may not recommend any screening for nonpregnant women who are considered low risk for pelvic cancer and who do not have symptoms. Ask your health care provider if a screening pelvic exam is right for you.  If you have had past treatment for cervical cancer or a condition that could lead to cancer,  you need Pap tests and screening for cancer for at least 20 years after your treatment. If Pap tests have been discontinued, your risk factors (such as having a new sexual partner) need to be reassessed to determine if screening should resume. Some women have medical problems that increase the chance of getting cervical cancer. In these cases, your health care provider may recommend more frequent screening and Pap tests. Colorectal Cancer  This type of cancer can be detected and often prevented.  Routine colorectal cancer screening usually begins at 48 years of age and continues through 48 years of age.  Your health care provider may recommend screening at an earlier age if you have risk factors for colon cancer.  Your health care provider may also recommend using home test kits to check for hidden blood in the stool.  A small camera at the end of a tube can be used to examine your colon directly (sigmoidoscopy or colonoscopy). This is done to check for the earliest forms of colorectal cancer.  Routine screening usually begins at age 56.  Direct examination of the colon should be repeated every 5-10 years through 48 years of age. However, you may need to be screened more often if early forms of precancerous polyps or small growths are found. Skin Cancer  Check your skin from head to toe regularly.  Tell your health care provider about any new moles or changes in moles, especially if there is a change in a mole's shape or color.  Also tell your health care provider if you have a mole that is larger than the size of a pencil eraser.  Always use sunscreen. Apply sunscreen liberally and repeatedly throughout the day.  Protect yourself by wearing long sleeves, pants, a wide-brimmed hat, and sunglasses whenever you are outside. Heart disease, diabetes, and high blood pressure  High blood pressure causes heart disease and increases the risk of stroke. High blood pressure is more likely to  develop in:  People who have blood pressure in the high end of the normal range (130-139/85-89 mm Hg).  People who are overweight or obese.  People who are African American.  If you are 37-29 years of age, have your blood pressure checked every 3-5 years. If you are 28 years of age or older, have your blood pressure checked every year. You should have your blood pressure measured twice-once when you are at a hospital or clinic, and once when you are not at a hospital or clinic. Record the average of the two measurements. To check your blood pressure when you are not at a hospital or clinic, you can use:  An automated blood pressure machine at a pharmacy.  A home blood pressure monitor.  If you are between 84 years and 94 years old, ask your health care provider if you should take aspirin to prevent strokes.  Have regular diabetes screenings. This involves taking a blood sample to check your fasting blood sugar level.  If you are at a normal weight and have a low risk for diabetes, have this test once every three years after 48 years of age.  If you are overweight and have a high risk for diabetes, consider being tested at a younger age or more often. Preventing infection Hepatitis B  If you have a higher risk for hepatitis B, you should be screened for this virus. You are considered at high risk for hepatitis B if:  You were born in a country where hepatitis B is common. Ask your health care provider which countries are considered high risk.  Your parents were born in a high-risk country, and you have not been immunized against hepatitis B (hepatitis B vaccine).  You have HIV or AIDS.  You use needles to inject street drugs.  You live with someone who has hepatitis B.  You have had sex with someone who has hepatitis B.  You get hemodialysis treatment.  You take certain medicines for conditions, including cancer, organ transplantation, and autoimmune conditions. Hepatitis  C  Blood testing is recommended for:  Everyone born from [birth year] through 1963.  Anyone with known risk factors for hepatitis C. Sexually transmitted infections (STIs)  You should be screened for sexually transmitted infections (STIs) including gonorrhea and chlamydia if:  You are sexually active and are younger than 48 years of age.  You are older than 48 years of age and your health care provider tells you that you are at risk for this type of infection.  Your sexual activity has changed since you were last screened and you are at an increased risk for chlamydia or gonorrhea. Ask your health care provider if you are at risk.  If you do not have HIV, but are at risk, it may be recommended that you take a prescription medicine daily to prevent HIV infection. This is called pre-exposure prophylaxis (PrEP). You are considered at risk if:  You are sexually active and do not regularly use condoms or know the HIV status of your partner(s).  You take drugs by injection.  You are sexually active with a partner who has HIV. Talk with your health care provider about whether you are at high risk of being infected with HIV. If you choose to begin PrEP, you should first be tested for HIV. You should then be tested every 3 months for as long as you are taking PrEP. Pregnancy  If you are premenopausal and you may become pregnant, ask your health care provider about preconception counseling.  If you may become pregnant, take 400 to 800 micrograms (mcg) of folic acid every day.  If you want to prevent pregnancy, talk to your health care provider about birth control (contraception). Osteoporosis and menopause  Osteoporosis is a disease in which the bones lose minerals and strength with aging. This can result in serious bone fractures. Your risk for osteoporosis can be identified using a bone density scan.  If you are 86 years of age or older, or if you are at risk for osteoporosis and fractures, ask  your health care provider if you should be screened.  Ask your health care provider whether you should take a calcium or vitamin D supplement to lower your  risk for osteoporosis.  Menopause may have certain physical symptoms and risks.  Hormone replacement therapy may reduce some of these symptoms and risks. Talk to your health care provider about whether hormone replacement therapy is right for you. Follow these instructions at home:  Schedule regular health, dental, and eye exams.  Stay current with your immunizations.  Do not use any tobacco products including cigarettes, chewing tobacco, or electronic cigarettes.  If you are pregnant, do not drink alcohol.  If you are breastfeeding, limit how much and how often you drink alcohol.  Limit alcohol intake to no more than 1 drink per day for nonpregnant women. One drink equals 12 ounces of beer, 5 ounces of wine, or 1 ounces of hard liquor.  Do not use street drugs.  Do not share needles.  Ask your health care provider for help if you need support or information about quitting drugs.  Tell your health care provider if you often feel depressed.  Tell your health care provider if you have ever been abused or do not feel safe at home. This information is not intended to replace advice given to you by your health care provider. Make sure you discuss any questions you have with your health care provider. Document Released: 11/03/2010 Document Revised: 09/26/2015 Document Reviewed: 01/22/2015  2017 [employer]

## 2016-03-25 NOTE — Assessment & Plan Note (Signed)
Chronic, markedly elevated today - pt has not taken medication yet. She will take amlodipine 10mg  right when she gets to work today. She will start monitoring blood pressures at home and notify me if persistently elevated. RTC 1 mo f/u visit HTN. She already avoids salt in diet.

## 2016-03-25 NOTE — Assessment & Plan Note (Signed)
Discussed healthy diet and lifestyle changes to affect sustainable weight loss  

## 2016-03-25 NOTE — Progress Notes (Signed)
Pre visit review using our clinic review tool, if applicable. No additional management support is needed unless otherwise documented below in the visit note. 

## 2016-03-25 NOTE — Assessment & Plan Note (Addendum)
Anticipate greater trochanteric bursitis. Provided with exercises from [facility] pt advisor. Also recommended reps of lateral leg raises.

## 2016-04-30 ENCOUNTER — Other Ambulatory Visit: Payer: Self-pay | Admitting: Family Medicine

## 2016-04-30 NOTE — Telephone Encounter (Signed)
Pt request status of flonase nasal spray. Advised pt refill done and pt will ck with pharmacy.

## 2016-07-31 ENCOUNTER — Other Ambulatory Visit: Payer: Self-pay | Admitting: Family Medicine

## 2017-02-17 DIAGNOSIS — N939 Abnormal uterine and vaginal bleeding, unspecified: Secondary | ICD-10-CM | POA: Diagnosis not present

## 2017-02-17 DIAGNOSIS — D259 Leiomyoma of uterus, unspecified: Secondary | ICD-10-CM | POA: Diagnosis not present

## 2017-02-23 DIAGNOSIS — D251 Intramural leiomyoma of uterus: Secondary | ICD-10-CM | POA: Diagnosis not present

## 2017-02-23 DIAGNOSIS — N921 Excessive and frequent menstruation with irregular cycle: Secondary | ICD-10-CM | POA: Diagnosis not present

## 2017-02-23 DIAGNOSIS — N852 Hypertrophy of uterus: Secondary | ICD-10-CM | POA: Diagnosis not present

## 2017-02-23 DIAGNOSIS — D259 Leiomyoma of uterus, unspecified: Secondary | ICD-10-CM | POA: Diagnosis not present

## 2017-03-04 LAB — HM MAMMOGRAPHY: HM MAMMO: NORMAL (ref 0–4)

## 2017-03-21 ENCOUNTER — Other Ambulatory Visit: Payer: Self-pay | Admitting: Family Medicine

## 2017-03-21 DIAGNOSIS — I10 Essential (primary) hypertension: Secondary | ICD-10-CM

## 2017-03-21 DIAGNOSIS — E559 Vitamin D deficiency, unspecified: Secondary | ICD-10-CM

## 2017-03-21 DIAGNOSIS — K824 Cholesterolosis of gallbladder: Secondary | ICD-10-CM

## 2017-03-21 DIAGNOSIS — D709 Neutropenia, unspecified: Secondary | ICD-10-CM

## 2017-03-21 DIAGNOSIS — K760 Fatty (change of) liver, not elsewhere classified: Secondary | ICD-10-CM

## 2017-03-21 DIAGNOSIS — R200 Anesthesia of skin: Secondary | ICD-10-CM

## 2017-03-23 ENCOUNTER — Other Ambulatory Visit (INDEPENDENT_AMBULATORY_CARE_PROVIDER_SITE_OTHER): Payer: Federal, State, Local not specified - PPO

## 2017-03-23 DIAGNOSIS — K824 Cholesterolosis of gallbladder: Secondary | ICD-10-CM | POA: Diagnosis not present

## 2017-03-23 DIAGNOSIS — K760 Fatty (change of) liver, not elsewhere classified: Secondary | ICD-10-CM | POA: Diagnosis not present

## 2017-03-23 DIAGNOSIS — R200 Anesthesia of skin: Secondary | ICD-10-CM

## 2017-03-23 DIAGNOSIS — E559 Vitamin D deficiency, unspecified: Secondary | ICD-10-CM

## 2017-03-23 DIAGNOSIS — I10 Essential (primary) hypertension: Secondary | ICD-10-CM

## 2017-03-23 DIAGNOSIS — D709 Neutropenia, unspecified: Secondary | ICD-10-CM | POA: Diagnosis not present

## 2017-03-23 LAB — COMPREHENSIVE METABOLIC PANEL
ALBUMIN: 4.1 g/dL (ref 3.5–5.2)
ALT: 11 U/L (ref 0–35)
AST: 17 U/L (ref 0–37)
Alkaline Phosphatase: 34 U/L — ABNORMAL LOW (ref 39–117)
BILIRUBIN TOTAL: 0.4 mg/dL (ref 0.2–1.2)
BUN: 8 mg/dL (ref 6–23)
CALCIUM: 9.2 mg/dL (ref 8.4–10.5)
CHLORIDE: 104 meq/L (ref 96–112)
CO2: 30 meq/L (ref 19–32)
Creatinine, Ser: 0.91 mg/dL (ref 0.40–1.20)
GFR: 84.43 mL/min (ref >60.00)
Glucose, Bld: 99 mg/dL (ref 70–99)
Potassium: 4.3 mEq/L (ref 3.5–5.1)
Sodium: 139 mEq/L (ref 135–145)
Total Protein: 7.4 g/dL (ref 6.0–8.3)

## 2017-03-23 LAB — CBC WITH DIFFERENTIAL/PLATELET
BASOS PCT: 1.2 % (ref 0.0–3.0)
Basophils Absolute: 0 10*3/uL (ref 0.0–0.1)
Eosinophils Absolute: 0.1 10*3/uL (ref 0.0–0.7)
Eosinophils Relative: 2.9 % (ref 0.0–5.0)
HEMATOCRIT: 41.3 % (ref 36.0–46.0)
HEMOGLOBIN: 13.7 g/dL (ref 12.0–15.0)
LYMPHS PCT: 49.3 % — AB (ref 12.0–46.0)
Lymphs Abs: 1.9 10*3/uL (ref 0.7–4.0)
MCHC: 33.1 g/dL (ref 30.0–36.0)
MCV: 92.5 fl (ref 78.0–100.0)
MONOS PCT: 10.7 % (ref 3.0–12.0)
Monocytes Absolute: 0.4 10*3/uL (ref 0.1–1.0)
NEUTROS ABS: 1.4 10*3/uL (ref 1.4–7.7)
Neutrophils Relative %: 35.9 % — ABNORMAL LOW (ref 43.0–77.0)
PLATELETS: 240 10*3/uL (ref 150.0–400.0)
RBC: 4.46 Mil/uL (ref 3.87–5.11)
RDW: 13 % (ref 11.5–15.5)
WBC: 3.9 10*3/uL — ABNORMAL LOW (ref 4.0–10.5)

## 2017-03-23 LAB — MICROALBUMIN / CREATININE URINE RATIO
Creatinine,U: 74.3 mg/dL
Microalb Creat Ratio: 0.9 mg/g (ref 0.0–30.0)
Microalb, Ur: <0.7 mg/dL (ref 0.0–1.9)

## 2017-03-23 LAB — LIPID PANEL
Cholesterol: 143 mg/dL (ref 0–200)
HDL: 54.8 mg/dL (ref >39.00)
LDL Cholesterol: 72 mg/dL (ref 0–99)
NonHDL: 88.41
Total CHOL/HDL Ratio: 3
Triglycerides: 84 mg/dL (ref 0.0–149.0)
VLDL: 16.8 mg/dL (ref 0.0–40.0)

## 2017-03-23 LAB — VITAMIN B12: Vitamin B-12: 456 pg/mL (ref 211–911)

## 2017-03-23 LAB — VITAMIN D 25 HYDROXY (VIT D DEFICIENCY, FRACTURES): VITD: 17.6 ng/mL — ABNORMAL LOW (ref 30.00–100.00)

## 2017-03-24 LAB — PATHOLOGIST SMEAR REVIEW

## 2017-03-30 ENCOUNTER — Encounter: Payer: Federal, State, Local not specified - PPO | Admitting: Family Medicine

## 2017-03-30 DIAGNOSIS — Z1231 Encounter for screening mammogram for malignant neoplasm of breast: Secondary | ICD-10-CM | POA: Diagnosis not present

## 2017-03-30 LAB — HM MAMMOGRAPHY

## 2017-04-02 ENCOUNTER — Encounter: Payer: Self-pay | Admitting: Family Medicine

## 2017-04-02 ENCOUNTER — Ambulatory Visit (INDEPENDENT_AMBULATORY_CARE_PROVIDER_SITE_OTHER): Payer: Federal, State, Local not specified - PPO | Admitting: Family Medicine

## 2017-04-02 VITALS — BP 126/84 | HR 83 | Temp 98.1°F | Ht 65.0 in | Wt 206.0 lb

## 2017-04-02 DIAGNOSIS — D259 Leiomyoma of uterus, unspecified: Secondary | ICD-10-CM | POA: Diagnosis not present

## 2017-04-02 DIAGNOSIS — E559 Vitamin D deficiency, unspecified: Secondary | ICD-10-CM

## 2017-04-02 DIAGNOSIS — R1900 Intra-abdominal and pelvic swelling, mass and lump, unspecified site: Secondary | ICD-10-CM | POA: Diagnosis not present

## 2017-04-02 DIAGNOSIS — C44599 Other specified malignant neoplasm of skin of other part of trunk: Secondary | ICD-10-CM | POA: Diagnosis not present

## 2017-04-02 DIAGNOSIS — I77811 Abdominal aortic ectasia: Secondary | ICD-10-CM

## 2017-04-02 DIAGNOSIS — Z0000 Encounter for general adult medical examination without abnormal findings: Secondary | ICD-10-CM | POA: Diagnosis not present

## 2017-04-02 DIAGNOSIS — I10 Essential (primary) hypertension: Secondary | ICD-10-CM | POA: Diagnosis not present

## 2017-04-02 DIAGNOSIS — E669 Obesity, unspecified: Secondary | ICD-10-CM

## 2017-04-02 DIAGNOSIS — K824 Cholesterolosis of gallbladder: Secondary | ICD-10-CM

## 2017-04-02 DIAGNOSIS — D709 Neutropenia, unspecified: Secondary | ICD-10-CM

## 2017-04-02 DIAGNOSIS — K760 Fatty (change of) liver, not elsewhere classified: Secondary | ICD-10-CM | POA: Diagnosis not present

## 2017-04-02 MED ORDER — VITAMIN D3 1.25 MG (50000 UT) PO TABS: 1.0000 | ORAL_TABLET | ORAL | 1 refills | Status: DC

## 2017-04-02 MED ORDER — AMLODIPINE BESYLATE 5 MG PO TABS: 5.0000 mg | ORAL_TABLET | Freq: Every day | ORAL | 3 refills | Status: DC

## 2017-04-02 MED ORDER — FLUTICASONE PROPIONATE 50 MCG/ACT NA SUSP: NASAL | 11 refills | Status: DC

## 2017-04-02 NOTE — Assessment & Plan Note (Signed)
Reviewed healthy diet and lifestyle changes to affect sustainable weight loss.  

## 2017-04-02 NOTE — Assessment & Plan Note (Signed)
Preventative protocols reviewed and updated unless pt declined. Discussed healthy diet and lifestyle.  

## 2017-04-02 NOTE — Assessment & Plan Note (Signed)
H/o this. 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 Discussed positive margins from original excision but patient continues to decline further eval by derm surgery at [facility] (initial recommendation). Denies new mass/swelling or pain at site of excision.

## 2017-04-02 NOTE — Assessment & Plan Note (Signed)
Update abd [place].

## 2017-04-02 NOTE — Assessment & Plan Note (Signed)
LFTs stable. Discussed with patient. Anticipate HS.

## 2017-04-02 NOTE — Assessment & Plan Note (Addendum)
LUQ and right sided - thought uterine fibroid.

## 2017-04-02 NOTE — Assessment & Plan Note (Signed)
Rpt [place] due 2021.

## 2017-04-02 NOTE — Patient Instructions (Addendum)
Vitamin D was low - start 50,000 units weekly prescription strength - sent to pharmacy. Ok to do lower amlodipine '5mg'$  daily for blood pressure - keep an eye on blood pressures and let me know if consistently >140/90.  We will schedule you for an abdominal ultrasound to follow gallbladder polyp.  Blood work is looking today. Continue healthy diet and exercise regimen. Return in 1 year for next physical.  Health Maintenance, Female Adopting a healthy lifestyle and getting preventive care can go a long way to promote health and wellness. Talk with your health care provider about what schedule of regular examinations is right for you. This is a good chance for you to check in with your provider about disease prevention and staying healthy. In between checkups, there are plenty of things you can do on your own. Experts have done a lot of research about which lifestyle changes and preventive measures are most likely to keep you healthy. Ask your health care provider for more information. Weight and diet Eat a healthy diet  Be sure to include plenty of vegetables, fruits, low-fat dairy products, and lean protein.  Do not eat a lot of foods high in solid fats, added sugars, or salt.  Get regular exercise. This is one of the most important things you can do for your health. ? Most adults should exercise for at least 150 minutes each week. The exercise should increase your heart rate and make you sweat (moderate-intensity exercise). ? Most adults should also do strengthening exercises at least twice a week. This is in addition to the moderate-intensity exercise.  Maintain a healthy weight  Body mass index (BMI) is a measurement that can be used to identify possible weight problems. It estimates body fat based on height and weight. Your health care provider can help determine your BMI and help you achieve or maintain a healthy weight.  For females 52 years of age and older: ? A BMI below 18.5 is  considered underweight. ? A BMI of 18.5 to 24.9 is normal. ? A BMI of 25 to 29.9 is considered overweight. ? A BMI of 30 and above is considered obese.  Watch levels of cholesterol and blood lipids  You should start having your blood tested for lipids and cholesterol at 48 years of age, then have this test every 5 years.  You may need to have your cholesterol levels checked more often if: ? Your lipid or cholesterol levels are high. ? You are older than 49 years of age. ? You are at high risk for heart disease.  Cancer screening Lung Cancer  Lung cancer screening is recommended for adults 53-86 years old who are at high risk for lung cancer because of a history of smoking.  A yearly low-dose CT scan of the lungs is recommended for people who: ? Currently smoke. ? Have quit within the past 15 years. ? Have at least a 30-pack-year history of smoking. A pack year is smoking an average of one pack of cigarettes a day for 1 year.  Yearly screening should continue until it has been 15 years since you quit.  Yearly screening should stop if you develop a health problem that would prevent you from having lung cancer treatment.  Breast Cancer  Practice breast self-awareness. This means understanding how your breasts normally appear and feel.  It also means doing regular breast self-exams. Let your health care provider know about any changes, no matter how small.  If you are in your 3s  or 3s, you should have a clinical breast exam (CBE) by a health care provider every 1-3 years as part of a regular health exam.  If you are 87 or older, have a CBE every year. Also consider having a breast X-ray (mammogram) every year.  If you have a family history of breast cancer, talk to your health care provider about genetic screening.  If you are at high risk for breast cancer, talk to your health care provider about having an MRI and a mammogram every year.  Breast cancer gene (BRCA) assessment  is recommended for women who have family members with BRCA-related cancers. BRCA-related cancers include: ? Breast. ? Ovarian. ? Tubal. ? Peritoneal cancers.  Results of the assessment will determine the need for genetic counseling and BRCA1 and BRCA2 testing.  Cervical Cancer Your health care provider may recommend that you be screened regularly for cancer of the pelvic organs (ovaries, uterus, and vagina). This screening involves a pelvic examination, including checking for microscopic changes to the surface of your cervix (Pap test). You may be encouraged to have this screening done every 3 years, beginning at age 48.  For women ages 17-65, health care providers may recommend pelvic exams and Pap testing every 3 years, or they may recommend the Pap and pelvic exam, combined with testing for human papilloma virus (HPV), every 5 years. Some types of HPV increase your risk of cervical cancer. Testing for HPV may also be done on women of any age with unclear Pap test results.  Other health care providers may not recommend any screening for nonpregnant women who are considered low risk for pelvic cancer and who do not have symptoms. Ask your health care provider if a screening pelvic exam is right for you.  If you have had past treatment for cervical cancer or a condition that could lead to cancer, you need Pap tests and screening for cancer for at least 20 years after your treatment. If Pap tests have been discontinued, your risk factors (such as having a new sexual partner) need to be reassessed to determine if screening should resume. Some women have medical problems that increase the chance of getting cervical cancer. In these cases, your health care provider may recommend more frequent screening and Pap tests.  Colorectal Cancer  This type of cancer can be detected and often prevented.  Routine colorectal cancer screening usually begins at 49 years of age and continues through 49 years of  age.  Your health care provider may recommend screening at an earlier age if you have risk factors for colon cancer.  Your health care provider may also recommend using home test kits to check for hidden blood in the stool.  A small camera at the end of a tube can be used to examine your colon directly (sigmoidoscopy or colonoscopy). This is done to check for the earliest forms of colorectal cancer.  Routine screening usually begins at age 45.  Direct examination of the colon should be repeated every 5-10 years through 49 years of age. However, you may need to be screened more often if early forms of precancerous polyps or small growths are found.  Skin Cancer  Check your skin from head to toe regularly.  Tell your health care provider about any new moles or changes in moles, especially if there is a change in a mole's shape or color.  Also tell your health care provider if you have a mole that is larger than the size of a  pencil eraser.  Always use sunscreen. Apply sunscreen liberally and repeatedly throughout the day.  Protect yourself by wearing long sleeves, pants, a wide-brimmed hat, and sunglasses whenever you are outside.  Heart disease, diabetes, and high blood pressure  High blood pressure causes heart disease and increases the risk of stroke. High blood pressure is more likely to develop in: ? People who have blood pressure in the high end of the normal range (130-139/85-89 mm Hg). ? People who are overweight or obese. ? People who are African American.  If you are 47-60 years of age, have your blood pressure checked every 3-5 years. If you are 68 years of age or older, have your blood pressure checked every year. You should have your blood pressure measured twice-once when you are at a hospital or clinic, and once when you are not at a hospital or clinic. Record the average of the two measurements. To check your blood pressure when you are not at a hospital or clinic, you  can use: ? An automated blood pressure machine at a pharmacy. ? A home blood pressure monitor.  If you are between 60 years and 32 years old, ask your health care provider if you should take aspirin to prevent strokes.  Have regular diabetes screenings. This involves taking a blood sample to check your fasting blood sugar level. ? If you are at a normal weight and have a low risk for diabetes, have this test once every three years after 49 years of age. ? If you are overweight and have a high risk for diabetes, consider being tested at a younger age or more often. Preventing infection Hepatitis B  If you have a higher risk for hepatitis B, you should be screened for this virus. You are considered at high risk for hepatitis B if: ? You were born in a country where hepatitis B is common. Ask your health care provider which countries are considered high risk. ? Your parents were born in a high-risk country, and you have not been immunized against hepatitis B (hepatitis B vaccine). ? You have HIV or AIDS. ? You use needles to inject street drugs. ? You live with someone who has hepatitis B. ? You have had sex with someone who has hepatitis B. ? You get hemodialysis treatment. ? You take certain medicines for conditions, including cancer, organ transplantation, and autoimmune conditions.  Hepatitis C  Blood testing is recommended for: ? Everyone born from [birth year] through 1963. ? Anyone with known risk factors for hepatitis C.  Sexually transmitted infections (STIs)  You should be screened for sexually transmitted infections (STIs) including gonorrhea and chlamydia if: ? You are sexually active and are younger than 49 years of age. ? You are older than 49 years of age and your health care provider tells you that you are at risk for this type of infection. ? Your sexual activity has changed since you were last screened and you are at an increased risk for chlamydia or gonorrhea. Ask your  health care provider if you are at risk.  If you do not have HIV, but are at risk, it may be recommended that you take a prescription medicine daily to prevent HIV infection. This is called pre-exposure prophylaxis (PrEP). You are considered at risk if: ? You are sexually active and do not regularly use condoms or know the HIV status of your partner(s). ? You take drugs by injection. ? You are sexually active with a partner who has HIV.  Talk with your health care provider about whether you are at high risk of being infected with HIV. If you choose to begin PrEP, you should first be tested for HIV. You should then be tested every 3 months for as long as you are taking PrEP. Pregnancy  If you are premenopausal and you may become pregnant, ask your health care provider about preconception counseling.  If you may become pregnant, take 400 to 800 micrograms (mcg) of folic acid every day.  If you want to prevent pregnancy, talk to your health care provider about birth control (contraception). Osteoporosis and menopause  Osteoporosis is a disease in which the bones lose minerals and strength with aging. This can result in serious bone fractures. Your risk for osteoporosis can be identified using a bone density scan.  If you are 36 years of age or older, or if you are at risk for osteoporosis and fractures, ask your health care provider if you should be screened.  Ask your health care provider whether you should take a calcium or vitamin D supplement to lower your risk for osteoporosis.  Menopause may have certain physical symptoms and risks.  Hormone replacement therapy may reduce some of these symptoms and risks. Talk to your health care provider about whether hormone replacement therapy is right for you. Follow these instructions at home:  Schedule regular health, dental, and eye exams.  Stay current with your immunizations.  Do not use any tobacco products including cigarettes, chewing  tobacco, or electronic cigarettes.  If you are pregnant, do not drink alcohol.  If you are breastfeeding, limit how much and how often you drink alcohol.  Limit alcohol intake to no more than 1 drink per day for nonpregnant women. One drink equals 12 ounces of beer, 5 ounces of wine, or 1 ounces of hard liquor.  Do not use street drugs.  Do not share needles.  Ask your health care provider for help if you need support or information about quitting drugs.  Tell your health care provider if you often feel depressed.  Tell your health care provider if you have ever been abused or do not feel safe at home. This information is not intended to replace advice given to you by your health care provider. Make sure you discuss any questions you have with your health care provider. Document Released: 11/03/2010 Document Revised: 09/26/2015 Document Reviewed: 01/22/2015 Elsevier Interactive Patient Education  [employer].

## 2017-04-02 NOTE — Assessment & Plan Note (Signed)
Followed by OBGYN considering hysterectomy.

## 2017-04-02 NOTE — Progress Notes (Signed)
BP 126/84 (BP Location: Left Arm, Patient Position: Sitting, Cuff Size: Normal)   Pulse 83   Temp 98.1 F (36.7 C) (Oral)   Ht 5\' 5"  (1.651 m)   Wt 206 lb (93.4 kg)   LMP 03/24/2017   SpO2 96%   BMI 34.28 kg/m    CC: CPE Subjective:    Patient ID: [name], female    DOB: [birth year], 51 y.o.   MRN: [id number]  HPI: [name] is a 51 y.o. female presenting on 04/02/2017 for Annual Exam   Did not return last year for f/u HTN. BP today better controlled.   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   Palpable abdominal mass thought fibroid uterus. Followed by GYN.   Preventative: Well woman with OBGYN (Dr Matthew Saras at [facility] [place]) - 03/2017 (normal pap per patient), h/o endometrial ablation. Aware of and monitoring fibroids. Considering hysterectomy. Started OCP.  Mammogram done this week per patient.  LMP - coming off period, regular periods.  Flu - declines Tdap - declines Seat belt use discussed Sunscreen use discussed. No changing moles on skin. Non smoker Alcohol - occasionally  Caffeine: 1 cup coffee/day Lives with husband, 3 sons Occupation: works for [employer], started going back to school Activity: going to gym 3x/wk - cardio  Diet: good water, fruits and vegetables daily   Relevant past medical, surgical, family and social history reviewed and updated as indicated. Interim medical history since our last visit reviewed. Allergies and medications reviewed and updated. Outpatient Medications Prior to Visit  Medication Sig Dispense Refill  . cholecalciferol (VITAMIN D) 1000 UNITS tablet Take 1,000 Units by mouth daily.    Marland Kitchen levocetirizine (XYZAL) 5 MG tablet Take 1 tablet (5 mg total) by mouth every evening. 30 tablet 11  . amLODipine (NORVASC) 10 MG tablet TAKE ONE TABLET BY MOUTH  ONCE DAILY 90 tablet 0  . fluticasone (FLONASE) 50 MCG/ACT nasal spray USE TWO SPRAY(S) IN EACH NOSTRIL ONCE DAILY 16 g 11   No facility-administered medications prior to visit.      Per HPI unless specifically indicated in ROS section below Review of Systems  Constitutional: Negative for activity change, appetite change, chills, fatigue, fever and unexpected weight change.  HENT: Negative for hearing loss.   Eyes: Negative for visual disturbance.  Respiratory: Negative for cough, chest tightness, shortness of breath and wheezing.   Cardiovascular: Negative for chest pain, palpitations and leg swelling.  Gastrointestinal: Negative for abdominal distention, abdominal pain, blood in stool, constipation, diarrhea, nausea and vomiting.  Genitourinary: Negative for difficulty urinating and hematuria.  Musculoskeletal: Negative for arthralgias, myalgias and neck pain.  Skin: Negative for rash.  Neurological: Negative for dizziness, seizures, syncope and headaches.  Hematological: Negative for adenopathy. Does not bruise/bleed easily.  Psychiatric/Behavioral: Negative for dysphoric mood. The patient is not nervous/anxious.        Objective:    BP 126/84 (BP Location: Left Arm, Patient Position: Sitting, Cuff Size: Normal)   Pulse 83   Temp 98.1 F (36.7 C) (Oral)   Ht 5\' 5"  (1.651 m)   Wt 206 lb (93.4 kg)   LMP 03/24/2017   SpO2 96%   BMI 34.28 kg/m   Wt Readings from Last 3 Encounters:  04/02/17 206 lb (93.4 kg)  03/25/16 203 lb 12 oz (92.4 kg)  01/04/15 208 lb 4 oz (  94.5 kg)    Physical Exam  Constitutional: She is oriented to person, place, and time. She appears well-developed and well-nourished. No distress.  HENT:  Head: Normocephalic and atraumatic.  Right Ear: Hearing, tympanic membrane, external ear and ear canal normal.  Left Ear: Hearing, tympanic membrane, external ear and ear canal normal.  Nose: Nose normal.  Mouth/Throat: Uvula is midline, oropharynx is clear and  moist and mucous membranes are normal. No oropharyngeal exudate, posterior oropharyngeal edema or posterior oropharyngeal erythema.  Eyes: Conjunctivae and EOM are normal. Pupils are equal, round, and reactive to light. No scleral icterus.  Neck: Normal range of motion. Neck supple. No thyromegaly present.  Cardiovascular: Normal rate, regular rhythm, normal heart sounds and intact distal pulses.  No murmur heard. Pulses:      Radial pulses are 2+ on the right side, and 2+ on the left side.  Pulmonary/Chest: Effort normal and breath sounds normal. No respiratory distress. She has no wheezes. She has no rales.  Abdominal: Soft. Bowel sounds are normal. She exhibits no distension and no mass. There is no tenderness. There is no rebound and no guarding.  Musculoskeletal: Normal range of motion. She exhibits no edema.  Lymphadenopathy:    She has no cervical adenopathy.  Neurological: She is alert and oriented to person, place, and time.  CN grossly intact, station and gait intact  Skin: Skin is warm and dry. No rash noted.  Psychiatric: She has a normal mood and affect. Her behavior is normal. Judgment and thought content normal.  Nursing note and vitals reviewed.  Results for orders placed or performed in visit on 04/02/17  HM MAMMOGRAPHY  Result Value Ref Range   HM Mammogram Self Reported Normal 0-4 Bi-Rad, Self Reported Normal      Assessment & Plan:   Problem List Items Addressed This Visit    Abdominal mass    LUQ and right sided - thought uterine fibroid.       Relevant Orders   US Abdomen Complete   Dermatofibrosarcoma protuberans of pubic region    H/o this. 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 Discussed positive margins from original excision but patient continues to decline further eval by derm surgery at [facility] (initial recommendation). Denies new mass/swelling or  pain at site of excision.       Ectatic abdominal aorta ([place])    Rpt [place] due 2021.       Relevant Medications   amLODipine (NORVASC) 5 MG tablet   Essential hypertension, benign    Chronic, stable and she ran out of amlodipine 2 months ago.will refill lower dose as pt endorses mildly elevated readings at home. I asked her to monitor BP and notify me if persistently elevated.       Relevant Medications   amLODipine (NORVASC) 5 MG tablet   Gallbladder polyp    Update abd [place].       Relevant Orders   US Abdomen Complete   Health maintenance examination - Primary    Preventative protocols reviewed and updated unless pt declined. Discussed healthy diet and lifestyle.       Hepatic steatosis    LFTs stable. Discussed with patient. Anticipate HS.       Neutropenia (HCC)    Lymphocytic leukopenia. Minimal leukopenia. Chronic, stable. periph smear stable.       Obesity, Class I, BMI 30-34.9    Reviewed healthy  diet and lifestyle changes to affect sustainable weight loss.       Uterine fibroid    Followed by OBGYN considering hysterectomy.       Relevant Orders   US Abdomen Complete   Vitamin D deficiency    Vit D remains low. Start 50,000 IU weekly x 6 months.           Follow up plan: Return in about 1 year (around 04/02/2018) for annual exam, prior fasting for blood work.  Ria Bush, MD

## 2017-04-02 NOTE — Assessment & Plan Note (Signed)
Chronic, stable and she ran out of amlodipine 2 months ago.will refill lower dose as pt endorses mildly elevated readings at home. I asked her to monitor BP and notify me if persistently elevated.

## 2017-04-02 NOTE — Assessment & Plan Note (Signed)
Vit D remains low. Start 50,000 IU weekly x 6 months.

## 2017-04-02 NOTE — Assessment & Plan Note (Addendum)
Lymphocytic leukopenia. Minimal leukopenia. Chronic, stable. periph smear stable.

## 2017-05-06 ENCOUNTER — Telehealth: Payer: Self-pay

## 2017-05-06 NOTE — Telephone Encounter (Signed)
May try liquid docusate (colace) which is a stool softener but can help with ear wax buildup - use a few drops into affected ear daily. Alternatively may use mineral oil to ear to soften wax. If this doesn't help, may come in for eval/irrigation.  Unfortunately I don't know of any prescription medication for this.

## 2017-05-06 NOTE — Telephone Encounter (Signed)
Left message on vm for pt to call back.  Need to relay message per Dr. Danise Mina.

## 2017-05-06 NOTE — Telephone Encounter (Signed)
Copied from [place] [id number]. Topic: Inquiry >> May 05, 2017  9:15 AM Pricilla Handler wrote: Reason for CRM: Patient called requesting if Dr. Darnell Level would send in a prescription of Ear Wax Build Up Medication/Treatment. Patient stated that she prefers a prscription without being see, but she will come in for an appt if necessary.  >> May 05, 2017  9:21 AM Margarite Gouge H, CMA wrote: Not sure if she needs to be seen. May be ear infection that is requiring more Tx

## 2017-05-24 NOTE — H&P (Signed)
NAMEDOLCE, [name]  MEDICAL RECORD NO.:  [id number]  LOCATION:                                 FACILITY:  PHYSICIAN:  Ralene Bathe. Matthew Saras, M.D.    DATE OF BIRTH:  DATE OF ADMISSION: DATE OF DISCHARGE:                             HISTORY & PHYSICAL   CHIEF COMPLAINT:  Symptomatic leiomyoma.  HISTORY OF PRESENT ILLNESS:  A 52 year old, G4, P4 prior tubal ultrasound October, 2018 in our office demonstrated a large fibroid uterus 11.5, 7.3, 6.7, 6.1, 4.9, 4.9, and smaller with a cavity looked fairly clean on saline infusion, presents at this time for definitive TAH bilateral salpingectomy.  This patient has had a prior Thermachoice ablation 9 years ago on b.i.d. OCP her bleeding has decreased significantly, but she prefers definitive management and is scheduled for TAH bilateral salpingectomy.  PAST MEDICAL HISTORY:  CURRENT MEDICATIONS:  Amlodipine for history of hypertension.  FAMILY HISTORY:  Significant for kidney disease, arthritis, diabetes, hypertension.  She is a G4, P4.  She has had a prior tubal ligation and Thermachoice ablation and 4 vaginal deliveries.  SOCIAL HISTORY:  Denies alcohol, tobacco, or drug use.  She is married. Her last Pap at age of 89 was normal.  PHYSICAL EXAMINATION:  VITAL SIGNS:  Temp 98.2, blood pressure 148/96. HEENT:  Unremarkable. NECK:  Supple without masses. LUNGS:  Clear. CARDIOVASCULAR:  Regular rate and rhythm without murmurs, rubs, gallops. BREASTS:  Without masses. ABDOMEN:  Revealed 18-week size fibroids.  Vulva, vagina, cervix normal. Pelvic exam consistent with 18 week size fibroid. EXTREMITIES: Unremarkable. NEUROLOGIC:  Unremarkable.  Her weight was 208.  IMPRESSION:  Symptomatic leiomyoma.  PLAN:  TAH bilateral salpingectomy.  This procedure including specific risks regarding bleeding, infection, adjacent organ injury, wound infection, phlebitis, the possible need for transfusion,  along with her expected recovery time all discussed which she understands and accepts.     [name] M. Matthew Saras, M.D.   ______________________________ Ralene Bathe. Matthew Saras, M.D.    [facility]/MEDQ  D:  05/24/2017  T:  05/24/2017  Job:  [id number]

## 2017-06-14 ENCOUNTER — Inpatient Hospital Stay (HOSPITAL_COMMUNITY)
Admission: RE | Admit: 2017-06-14 | Payer: Federal, State, Local not specified - PPO | Source: Ambulatory Visit | Admitting: Obstetrics and Gynecology

## 2017-06-14 ENCOUNTER — Encounter (HOSPITAL_COMMUNITY): Admission: RE | Payer: Self-pay | Source: Ambulatory Visit

## 2017-06-14 SURGERY — HYSTERECTOMY, TOTAL, ABDOMINAL, WITH SALPINGECTOMY
Anesthesia: Choice | Laterality: Bilateral

## 2017-07-06 DIAGNOSIS — D259 Leiomyoma of uterus, unspecified: Secondary | ICD-10-CM | POA: Diagnosis not present

## 2017-07-06 DIAGNOSIS — N939 Abnormal uterine and vaginal bleeding, unspecified: Secondary | ICD-10-CM | POA: Diagnosis not present

## 2017-07-08 NOTE — Patient Instructions (Addendum)
Your procedure is scheduled on: Thursday July 22, 2017 at 7:30 am  Enter through the Main Entrance of [facility] at: 6:00 am  Pick up the phone at the desk and dial [phone].  Call this number if you have problems the morning of surgery: [phone].  Remember: Do NOT eat food or drink any liquids after: Midnight on Wednesday March 20  Take these medicines the morning of surgery with a SIP OF WATER: Amlodipine  STOP ALL HERBAL AND SUPPLEMENTS 1 WEEK PRIOR TO SURGERY  DO NOT SMOKE DAY OF SURGERY  Do NOT wear jewelry (body piercing), metal hair clips/bobby pins, make-up, or nail polish. Do NOT wear lotions, powders, or perfumes.  You may wear deoderant. Do NOT shave for 48 hours prior to surgery. Do NOT bring valuables to the hospital. Contacts, dentures, or bridgework may not be worn into surgery. Leave suitcase in car.  After surgery it may be brought to your room. For patients admitted to the hospital, checkout time is 11:00 AM the day of discharge.

## 2017-07-09 ENCOUNTER — Encounter (HOSPITAL_COMMUNITY): Payer: Self-pay

## 2017-07-09 ENCOUNTER — Encounter (HOSPITAL_COMMUNITY)
Admission: RE | Admit: 2017-07-09 | Discharge: 2017-07-09 | Disposition: A | Discharge status: Home / Self Care | Payer: Federal, State, Local not specified - PPO | Source: Ambulatory Visit | Attending: Obstetrics and Gynecology | Admitting: Obstetrics and Gynecology

## 2017-07-09 ENCOUNTER — Other Ambulatory Visit: Payer: Self-pay

## 2017-07-09 DIAGNOSIS — Z01810 Encounter for preprocedural cardiovascular examination: Secondary | ICD-10-CM | POA: Insufficient documentation

## 2017-07-09 DIAGNOSIS — Z01812 Encounter for preprocedural laboratory examination: Secondary | ICD-10-CM | POA: Insufficient documentation

## 2017-07-09 LAB — BASIC METABOLIC PANEL
Anion gap: 9 (ref 5–15)
BUN: 9 mg/dL (ref 6–20)
CALCIUM: 8.7 mg/dL — AB (ref 8.9–10.3)
CO2: 25 mmol/L (ref 22–32)
Chloride: 102 mmol/L (ref 101–111)
Creatinine, Ser: 0.84 mg/dL (ref 0.44–1.00)
GFR calc Af Amer: >60 mL/min (ref >60)
GFR calc non Af Amer: >60 mL/min (ref >60)
Glucose, Bld: 122 mg/dL — ABNORMAL HIGH (ref 65–99)
Potassium: 3.4 mmol/L — ABNORMAL LOW (ref 3.5–5.1)
Sodium: 136 mmol/L (ref 135–145)

## 2017-07-09 LAB — CBC
HCT: 40.5 % (ref 36.0–46.0)
Hemoglobin: 13.8 g/dL (ref 12.0–15.0)
MCH: 30.5 pg (ref 26.0–34.0)
MCHC: 34.1 g/dL (ref 30.0–36.0)
MCV: 89.6 fL (ref 78.0–100.0)
Platelets: 240 10*3/uL (ref 150–400)
RBC: 4.52 MIL/uL (ref 3.87–5.11)
RDW: 12.2 % (ref 11.5–15.5)
WBC: 3.5 10*3/uL — AB (ref 4.0–10.5)

## 2017-07-09 LAB — ABO/RH: ABO/RH(D): AB POS

## 2017-07-09 NOTE — Pre-Procedure Instructions (Signed)
Dr. Seward Speck viewed and okay' d EKG

## 2017-07-12 NOTE — H&P (Signed)
[facility]  DICTATION # [id number] CSN# [id number]   Margarette Asal, MD 07/12/2017 2:56 PM

## 2017-07-13 NOTE — H&P (Signed)
[name], [name] NO.:  [id number]  MEDICAL RECORD NO.:  [id number]  LOCATION:                                 FACILITY:  PHYSICIAN:  Ralene Bathe. Matthew Saras, M.D.DATE OF BIRTH:  1966-02-17  DATE OF ADMISSION:  07/22/2017 DATE OF DISCHARGE:                             HISTORY & PHYSICAL   CHIEF COMPLAINT:  Symptomatic leiomyoma.  HISTORY OF PRESENT ILLNESS:  A 52 year old, G4, P4 prior tubal, who has had a [employer]macho[employer] endometrial ablation years ago, who has known leiomyoma.  Recently, due to some irregular bleeding, was placed on OCPs b.i.d. with failure to control.  I went ahead and tried to schedule an office HSG.  Large fibroids were noted, 11.5, 7.3, 6.7, 6.1, and smaller.  We were able to get some saline in the cavity.  No masses were noted, but was suboptimal view.  She presents at this time for definitive TAH, bilateral salpingectomy.  This procedure including specific risks related to bleeding, infection, transfusion, adjacent organ injury, wound infection, phlebitis along with her expected recovery time discussed.  The rationale for bilateral salpingectomy reviewed with her also.  ALLERGIES:  None.  MEDICATIONS:  Amlodipine.  FAMILY HISTORY:  Significant for diabetes, arthritis, kidney disease, hypertension.  SOCIAL HISTORY:  Denies alcohol, tobacco, or drug use.  She is married.  PHYSICAL EXAMINATION:  VITAL SIGNS:  Temp 98.2, blood pressure 148/90. HEENT:  Unremarkable. NECK:  Supple without masses. HEART AND LUNGS:  Clear. ABDOMEN:  Fundus is at the umbilicus.  Abdominal exam is otherwise negative. PELVIC:  Uterus 18 to 20 weeks size. EXTREMITIES:  Unremarkable. NEUROLOGIC:  Unremarkable.  IMPRESSION:  Large symptomatic leiomyoma, prior history of endometrial ablation.  PLAN:  TAH, bilateral salpingectomy.  Procedure and risks discussed as above.     Wilbern Pennypacker M. Matthew Saras, M.D.     RM[facility]/MEDQ  D:  07/12/2017  T:  07/12/2017  Job:   [id number]

## 2017-07-19 DIAGNOSIS — D259 Leiomyoma of uterus, unspecified: Secondary | ICD-10-CM | POA: Diagnosis not present

## 2017-07-20 ENCOUNTER — Other Ambulatory Visit: Payer: Federal, State, Local not specified - PPO

## 2017-07-22 ENCOUNTER — Encounter (HOSPITAL_COMMUNITY)
Admission: RE | Disposition: A | Discharge status: Home / Self Care | Payer: Self-pay | Source: Ambulatory Visit | Attending: Obstetrics and Gynecology

## 2017-07-22 ENCOUNTER — Inpatient Hospital Stay (HOSPITAL_COMMUNITY): Payer: Federal, State, Local not specified - PPO | Admitting: Anesthesiology

## 2017-07-22 ENCOUNTER — Inpatient Hospital Stay (HOSPITAL_COMMUNITY)
Admission: RE | Admit: 2017-07-22 | Discharge: 2017-07-24 | DRG: 743 | Disposition: A | Discharge status: Home / Self Care | Payer: Federal, State, Local not specified - PPO | Source: Ambulatory Visit | Attending: Obstetrics and Gynecology | Admitting: Obstetrics and Gynecology

## 2017-07-22 ENCOUNTER — Other Ambulatory Visit: Payer: Self-pay

## 2017-07-22 ENCOUNTER — Encounter (HOSPITAL_COMMUNITY): Payer: Self-pay | Admitting: Emergency Medicine

## 2017-07-22 DIAGNOSIS — I10 Essential (primary) hypertension: Secondary | ICD-10-CM | POA: Diagnosis not present

## 2017-07-22 DIAGNOSIS — D259 Leiomyoma of uterus, unspecified: Secondary | ICD-10-CM | POA: Diagnosis not present

## 2017-07-22 DIAGNOSIS — D649 Anemia, unspecified: Secondary | ICD-10-CM | POA: Diagnosis not present

## 2017-07-22 DIAGNOSIS — D709 Neutropenia, unspecified: Secondary | ICD-10-CM | POA: Diagnosis not present

## 2017-07-22 DIAGNOSIS — D219 Benign neoplasm of connective and other soft tissue, unspecified: Secondary | ICD-10-CM | POA: Diagnosis present

## 2017-07-22 DIAGNOSIS — I77811 Abdominal aortic ectasia: Secondary | ICD-10-CM | POA: Diagnosis not present

## 2017-07-22 HISTORY — PX: HYSTERECTOMY ABDOMINAL WITH SALPINGECTOMY: SHX6725

## 2017-07-22 HISTORY — PX: OOPHORECTOMY: SHX6387

## 2017-07-22 LAB — CBC
HEMATOCRIT: 27.7 % — AB (ref 36.0–46.0)
HEMATOCRIT: 25.9 % — AB (ref 36.0–46.0)
HEMOGLOBIN: 8.7 g/dL — AB (ref 12.0–15.0)
Hemoglobin: 9.5 g/dL — ABNORMAL LOW (ref 12.0–15.0)
MCH: 31.5 pg (ref 26.0–34.0)
MCH: 30.5 pg (ref 26.0–34.0)
MCHC: 34.3 g/dL (ref 30.0–36.0)
MCHC: 33.6 g/dL (ref 30.0–36.0)
MCV: 91.7 fL (ref 78.0–100.0)
MCV: 90.9 fL (ref 78.0–100.0)
PLATELETS: 232 10*3/uL (ref 150–400)
Platelets: 203 10*3/uL (ref 150–400)
RBC: 3.02 MIL/uL — ABNORMAL LOW (ref 3.87–5.11)
RBC: 2.85 MIL/uL — ABNORMAL LOW (ref 3.87–5.11)
RDW: 12.4 % (ref 11.5–15.5)
RDW: 12.4 % (ref 11.5–15.5)
WBC: 15.1 10*3/uL — ABNORMAL HIGH (ref 4.0–10.5)
WBC: 11.3 10*3/uL — ABNORMAL HIGH (ref 4.0–10.5)

## 2017-07-22 LAB — PREGNANCY, URINE: Preg Test, Ur: NEGATIVE

## 2017-07-22 SURGERY — HYSTERECTOMY, TOTAL, ABDOMINAL, WITH SALPINGECTOMY
Anesthesia: General | Laterality: Right

## 2017-07-22 MED ORDER — ROCURONIUM BROMIDE 100 MG/10ML IV SOLN
INTRAVENOUS | Status: DC | PRN
  Administered 2017-07-22: 50 mg via INTRAVENOUS

## 2017-07-22 MED ORDER — NALOXONE HCL 0.4 MG/ML IJ SOLN: 0.4000 mg | INTRAMUSCULAR | Status: DC | PRN

## 2017-07-22 MED ORDER — DEXAMETHASONE SODIUM PHOSPHATE 4 MG/ML IJ SOLN
INTRAMUSCULAR | Status: AC
  Filled 2017-07-22: qty 1

## 2017-07-22 MED ORDER — MIDAZOLAM HCL 2 MG/2ML IJ SOLN
INTRAMUSCULAR | Status: AC
  Filled 2017-07-22: qty 2

## 2017-07-22 MED ORDER — SODIUM CHLORIDE 0.9 % IJ SOLN
INTRAMUSCULAR | Status: DC | PRN
  Administered 2017-07-22: 30 mL

## 2017-07-22 MED ORDER — ONDANSETRON HCL 4 MG PO TABS: 4.0000 mg | ORAL_TABLET | Freq: Four times a day (QID) | ORAL | Status: DC | PRN

## 2017-07-22 MED ORDER — MEPERIDINE HCL 25 MG/ML IJ SOLN: 6.2500 mg | INTRAMUSCULAR | Status: DC | PRN

## 2017-07-22 MED ORDER — DEXAMETHASONE SODIUM PHOSPHATE 4 MG/ML IJ SOLN
INTRAMUSCULAR | Status: DC | PRN
  Administered 2017-07-22: 4 mg via INTRAVENOUS

## 2017-07-22 MED ORDER — LACTATED RINGERS IV SOLN: INTRAVENOUS | Status: DC

## 2017-07-22 MED ORDER — 0.9 % SODIUM CHLORIDE (POUR BTL) OPTIME
TOPICAL | Status: DC | PRN
  Administered 2017-07-22: 2000 mL

## 2017-07-22 MED ORDER — LORATADINE 10 MG PO TABS
10.0000 mg | ORAL_TABLET | Freq: Every day | ORAL | Status: DC
  Administered 2017-07-22 – 2017-07-23 (×2): 10 mg via ORAL
  Filled 2017-07-22 (×2): qty 1

## 2017-07-22 MED ORDER — FENTANYL CITRATE (PF) 250 MCG/5ML IJ SOLN
INTRAMUSCULAR | Status: AC
  Filled 2017-07-22: qty 5

## 2017-07-22 MED ORDER — ONDANSETRON HCL 4 MG/2ML IJ SOLN
INTRAMUSCULAR | Status: DC | PRN
  Administered 2017-07-22: 4 mg via INTRAVENOUS

## 2017-07-22 MED ORDER — BUPIVACAINE HCL (PF) 0.25 % IJ SOLN
INTRAMUSCULAR | Status: AC
  Filled 2017-07-22: qty 30

## 2017-07-22 MED ORDER — BUPIVACAINE LIPOSOME 1.3 % IJ SUSP
20.0000 mL | Freq: Once | INTRAMUSCULAR | Status: AC
  Administered 2017-07-22: 20 mL
  Filled 2017-07-22: qty 20

## 2017-07-22 MED ORDER — KETOROLAC TROMETHAMINE 30 MG/ML IJ SOLN
INTRAMUSCULAR | Status: AC
  Filled 2017-07-22: qty 1

## 2017-07-22 MED ORDER — IBUPROFEN 800 MG PO TABS
800.0000 mg | ORAL_TABLET | Freq: Three times a day (TID) | ORAL | Status: DC | PRN
  Administered 2017-07-23 – 2017-07-24 (×3): 800 mg via ORAL
  Filled 2017-07-22 (×3): qty 1

## 2017-07-22 MED ORDER — KETOROLAC TROMETHAMINE 30 MG/ML IJ SOLN
30.0000 mg | Freq: Once | INTRAMUSCULAR | Status: AC
  Administered 2017-07-22: 30 mg via INTRAVENOUS

## 2017-07-22 MED ORDER — SCOPOLAMINE 1 MG/3DAYS TD PT72
MEDICATED_PATCH | TRANSDERMAL | Status: AC
  Administered 2017-07-22: 1.5 mg via TRANSDERMAL
  Filled 2017-07-22: qty 1

## 2017-07-22 MED ORDER — MORPHINE SULFATE 2 MG/ML IV SOLN
INTRAVENOUS | Status: DC
  Administered 2017-07-22: 2 mg via INTRAVENOUS
  Administered 2017-07-22: 3 mg via INTRAVENOUS
  Administered 2017-07-22: 8 mg via INTRAVENOUS
  Administered 2017-07-22: 12:00:00 via INTRAVENOUS
  Administered 2017-07-23 (×2): 7 mg via INTRAVENOUS
  Administered 2017-07-23: 0 mg via INTRAVENOUS
  Filled 2017-07-22: qty 30

## 2017-07-22 MED ORDER — DIPHENHYDRAMINE HCL 50 MG/ML IJ SOLN
12.5000 mg | Freq: Four times a day (QID) | INTRAMUSCULAR | Status: DC | PRN
  Administered 2017-07-24: 12.5 mg via INTRAVENOUS
  Filled 2017-07-22: qty 1

## 2017-07-22 MED ORDER — LIDOCAINE HCL (CARDIAC) 20 MG/ML IV SOLN
INTRAVENOUS | Status: DC | PRN
  Administered 2017-07-22: 80 mg via INTRAVENOUS

## 2017-07-22 MED ORDER — ONDANSETRON HCL 4 MG/2ML IJ SOLN: 4.0000 mg | Freq: Four times a day (QID) | INTRAMUSCULAR | Status: DC | PRN

## 2017-07-22 MED ORDER — ROCURONIUM BROMIDE 100 MG/10ML IV SOLN
INTRAVENOUS | Status: AC
  Filled 2017-07-22: qty 1

## 2017-07-22 MED ORDER — FLUTICASONE PROPIONATE 50 MCG/ACT NA SUSP
1.0000 | Freq: Every day | NASAL | Status: DC
  Administered 2017-07-23: 1 via NASAL
  Filled 2017-07-22: qty 16

## 2017-07-22 MED ORDER — MIDAZOLAM HCL 2 MG/2ML IJ SOLN
INTRAMUSCULAR | Status: DC | PRN
  Administered 2017-07-22: 2 mg via INTRAVENOUS

## 2017-07-22 MED ORDER — LACTATED RINGERS IV SOLN
INTRAVENOUS | Status: DC
  Administered 2017-07-22 (×3): via INTRAVENOUS

## 2017-07-22 MED ORDER — SODIUM CHLORIDE 0.9 % IJ SOLN
INTRAMUSCULAR | Status: AC
  Filled 2017-07-22: qty 10

## 2017-07-22 MED ORDER — SODIUM CHLORIDE 0.9 % IJ SOLN
INTRAMUSCULAR | Status: AC
  Filled 2017-07-22: qty 50

## 2017-07-22 MED ORDER — LIDOCAINE HCL (CARDIAC) 20 MG/ML IV SOLN
INTRAVENOUS | Status: AC
  Filled 2017-07-22: qty 5

## 2017-07-22 MED ORDER — HYDROMORPHONE HCL 1 MG/ML IJ SOLN
INTRAMUSCULAR | Status: AC
  Filled 2017-07-22: qty 1

## 2017-07-22 MED ORDER — PROPOFOL 10 MG/ML IV BOLUS
INTRAVENOUS | Status: DC | PRN
  Administered 2017-07-22: 30 mg via INTRAVENOUS
  Administered 2017-07-22: 200 mg via INTRAVENOUS
  Administered 2017-07-22: 20 mg via INTRAVENOUS
  Administered 2017-07-22: 30 mg via INTRAVENOUS

## 2017-07-22 MED ORDER — SUGAMMADEX SODIUM 200 MG/2ML IV SOLN
INTRAVENOUS | Status: AC
  Filled 2017-07-22: qty 2

## 2017-07-22 MED ORDER — DIPHENHYDRAMINE HCL 12.5 MG/5ML PO ELIX: 12.5000 mg | ORAL_SOLUTION | Freq: Four times a day (QID) | ORAL | Status: DC | PRN

## 2017-07-22 MED ORDER — MENTHOL 3 MG MT LOZG
1.0000 | LOZENGE | OROMUCOSAL | Status: DC | PRN
  Filled 2017-07-22: qty 9

## 2017-07-22 MED ORDER — METHYLENE BLUE 0.5 % INJ SOLN
INTRAVENOUS | Status: AC
  Filled 2017-07-22: qty 10

## 2017-07-22 MED ORDER — HYDROMORPHONE HCL 1 MG/ML IJ SOLN
INTRAMUSCULAR | Status: AC
  Administered 2017-07-22: 0.5 mg via INTRAVENOUS
  Filled 2017-07-22: qty 1

## 2017-07-22 MED ORDER — LEVOCETIRIZINE DIHYDROCHLORIDE 5 MG PO TABS: 5.0000 mg | ORAL_TABLET | Freq: Every evening | ORAL | Status: DC

## 2017-07-22 MED ORDER — KETOROLAC TROMETHAMINE 30 MG/ML IJ SOLN
30.0000 mg | Freq: Four times a day (QID) | INTRAMUSCULAR | Status: DC
  Administered 2017-07-22 – 2017-07-23 (×3): 30 mg via INTRAVENOUS
  Filled 2017-07-22 (×3): qty 1

## 2017-07-22 MED ORDER — KETOROLAC TROMETHAMINE 30 MG/ML IJ SOLN: 30.0000 mg | Freq: Four times a day (QID) | INTRAMUSCULAR | Status: DC

## 2017-07-22 MED ORDER — DEXTROSE IN LACTATED RINGERS 5 % IV SOLN
INTRAVENOUS | Status: DC
  Administered 2017-07-22 – 2017-07-23 (×3): via INTRAVENOUS

## 2017-07-22 MED ORDER — HYDROMORPHONE HCL 1 MG/ML IJ SOLN
0.2500 mg | INTRAMUSCULAR | Status: DC | PRN
  Administered 2017-07-22: 0.5 mg via INTRAVENOUS

## 2017-07-22 MED ORDER — CEFOTETAN DISODIUM-DEXTROSE 2-2.08 GM-%(50ML) IV SOLR
INTRAVENOUS | Status: AC
  Filled 2017-07-22: qty 50

## 2017-07-22 MED ORDER — PROPOFOL 10 MG/ML IV BOLUS
INTRAVENOUS | Status: AC
  Filled 2017-07-22: qty 20

## 2017-07-22 MED ORDER — SCOPOLAMINE 1 MG/3DAYS TD PT72
1.0000 | MEDICATED_PATCH | Freq: Once | TRANSDERMAL | Status: DC
  Administered 2017-07-22: 1.5 mg via TRANSDERMAL

## 2017-07-22 MED ORDER — HYDROMORPHONE HCL 1 MG/ML IJ SOLN
INTRAMUSCULAR | Status: DC | PRN
  Administered 2017-07-22: 1 mg via INTRAVENOUS
  Administered 2017-07-22 (×2): 0.5 mg via INTRAVENOUS

## 2017-07-22 MED ORDER — SODIUM CHLORIDE 0.9% FLUSH: 9.0000 mL | INTRAVENOUS | Status: DC | PRN

## 2017-07-22 MED ORDER — SUGAMMADEX SODIUM 200 MG/2ML IV SOLN
INTRAVENOUS | Status: DC | PRN
  Administered 2017-07-22: 200 mg via INTRAVENOUS

## 2017-07-22 MED ORDER — FENTANYL CITRATE (PF) 100 MCG/2ML IJ SOLN
INTRAMUSCULAR | Status: DC | PRN
  Administered 2017-07-22: 150 ug via INTRAVENOUS
  Administered 2017-07-22 (×2): 50 ug via INTRAVENOUS

## 2017-07-22 MED ORDER — ONDANSETRON HCL 4 MG/2ML IJ SOLN
INTRAMUSCULAR | Status: AC
  Filled 2017-07-22: qty 2

## 2017-07-22 MED ORDER — CEFOTETAN DISODIUM-DEXTROSE 2-2.08 GM-%(50ML) IV SOLR: 2.0000 g | INTRAVENOUS | Status: DC

## 2017-07-22 MED ORDER — CEFOTETAN DISODIUM-DEXTROSE 2-2.08 GM-%(50ML) IV SOLR
2.0000 g | INTRAVENOUS | Status: AC
  Administered 2017-07-22: 2 g via INTRAVENOUS

## 2017-07-22 MED ORDER — AMLODIPINE BESYLATE 5 MG PO TABS
5.0000 mg | ORAL_TABLET | Freq: Every day | ORAL | Status: DC
  Administered 2017-07-23 – 2017-07-24 (×2): 5 mg via ORAL
  Filled 2017-07-22 (×2): qty 1

## 2017-07-22 MED ORDER — PROMETHAZINE HCL 25 MG/ML IJ SOLN: 6.2500 mg | INTRAMUSCULAR | Status: DC | PRN

## 2017-07-22 MED ORDER — BUTORPHANOL TARTRATE 1 MG/ML IJ SOLN: 1.0000 mg | INTRAMUSCULAR | Status: DC | PRN

## 2017-07-22 SURGICAL SUPPLY — 48 items
APL SKNCLS STERI-STRIP NONHPOA (GAUZE/BANDAGES/DRESSINGS)
BARRIER ADHS 3X4 INTERCEED (GAUZE/BANDAGES/DRESSINGS) IMPLANT
BENZOIN TINCTURE PRP APPL 2/3 (GAUZE/BANDAGES/DRESSINGS) IMPLANT
BRR ADH 4X3 ABS CNTRL BYND (GAUZE/BANDAGES/DRESSINGS)
CANISTER SUCT 3000ML PPV (MISCELLANEOUS) ×3 IMPLANT
CONT PATH 16OZ SNAP LID 3702 (MISCELLANEOUS) ×3 IMPLANT
DECANTER SPIKE VIAL GLASS SM (MISCELLANEOUS) ×3 IMPLANT
DRAPE CESAREAN BIRTH W POUCH (DRAPES) ×3 IMPLANT
DRAPE WARM FLUID 44X44 (DRAPE) ×3 IMPLANT
DRSG OPSITE POSTOP 4X10 (GAUZE/BANDAGES/DRESSINGS) ×3 IMPLANT
DURAPREP 26ML APPLICATOR (WOUND CARE) ×3 IMPLANT
GAUZE SPONGE 4X4 16PLY XRAY LF (GAUZE/BANDAGES/DRESSINGS) ×3 IMPLANT
GLOVE BIO SURGEON STRL SZ7 (GLOVE) ×3 IMPLANT
GLOVE BIOGEL PI IND STRL 7.0 (GLOVE) ×4 IMPLANT
GLOVE BIOGEL PI INDICATOR 7.0 (GLOVE) ×2
GOWN STRL REUS W/TWL LRG LVL3 (GOWN DISPOSABLE) ×9 IMPLANT
HEMOSTAT ARISTA ABSORB 3G PWDR (MISCELLANEOUS) ×3 IMPLANT
NEEDLE HYPO 18GX1.5 BLUNT FILL (NEEDLE) ×3 IMPLANT
NEEDLE HYPO 22GX1.5 SAFETY (NEEDLE) ×6 IMPLANT
NEEDLE SPNL 22GX3.5 QUINCKE BK (NEEDLE) ×3 IMPLANT
NS IRRIG 1000ML POUR BTL (IV SOLUTION) ×3 IMPLANT
PACK ABDOMINAL GYN (CUSTOM PROCEDURE TRAY) ×3 IMPLANT
PAD OB MATERNITY 4.3X12.25 (PERSONAL CARE ITEMS) ×3 IMPLANT
PENCIL SMOKE EVAC W/HOLSTER (ELECTROSURGICAL) ×3 IMPLANT
PROTECTOR NERVE ULNAR (MISCELLANEOUS) ×3 IMPLANT
RTRCTR C-SECT PINK 25CM LRG (MISCELLANEOUS) ×3 IMPLANT
SPONGE LAP 18X18 X RAY DECT (DISPOSABLE) ×9 IMPLANT
STRIP CLOSURE SKIN 1/2X4 (GAUZE/BANDAGES/DRESSINGS) ×6 IMPLANT
SUT CHROMIC 2 0 SH (SUTURE) ×3 IMPLANT
SUT CHROMIC 3 0 SH 27 (SUTURE) IMPLANT
SUT MNCRL AB 4-0 PS2 18 (SUTURE) ×3 IMPLANT
SUT MON AB 2-0 CT1 36 (SUTURE) ×3 IMPLANT
SUT MON AB 4-0 PS1 27 (SUTURE) ×3 IMPLANT
SUT PDS AB 0 CT1 27 (SUTURE) ×6 IMPLANT
SUT PDS AB 0 CTX 60 (SUTURE) ×6 IMPLANT
SUT VIC AB 0 CT1 18XCR BRD8 (SUTURE) ×6 IMPLANT
SUT VIC AB 0 CT1 27 (SUTURE) ×3
SUT VIC AB 0 CT1 27XBRD ANBCTR (SUTURE) ×2 IMPLANT
SUT VIC AB 0 CT1 8-18 (SUTURE) ×9
SUT VIC AB 2-0 CT1 27 (SUTURE) ×2
SUT VIC AB 2-0 CT1 TAPERPNT 27 (SUTURE) ×2 IMPLANT
SUT VIC AB 3-0 CT1 27 (SUTURE) ×2
SUT VIC AB 3-0 CT1 TAPERPNT 27 (SUTURE) ×2 IMPLANT
SUT VICRYL 0 TIES 12 18 (SUTURE) ×3 IMPLANT
SYR 20CC LL (SYRINGE) ×3 IMPLANT
SYR CONTROL 10ML LL (SYRINGE) ×6 IMPLANT
TOWEL OR 17X24 6PK STRL BLUE (TOWEL DISPOSABLE) ×6 IMPLANT
TRAY FOLEY CATH SILVER 14FR (SET/KITS/TRAYS/PACK) ×3 IMPLANT

## 2017-07-22 NOTE — Progress Notes (Signed)
CTSP re bloody dressing>>>ordered CBC stat  S// alert + conversant O??BP 123/70 (BP Location: Right Arm)   Pulse 95   Temp 98.6 F (37 C) (Axillary)   Resp (!) 36   Ht 5\' 6"  (1.676 m)   Wt 214 lb (97.1 kg)   LMP 06/25/2017   SpO2 95%   BMI 34.54 kg/m  CBC    Component Value Date/Time   WBC 15.1 (H) 07/22/2017 1651   RBC 3.02 (L) 07/22/2017 1651   HGB 9.5 (L) 07/22/2017 1651   HCT 27.7 (L) 07/22/2017 1651   PLT 232 07/22/2017 1651   MCV 91.7 07/22/2017 1651   MCH 31.5 07/22/2017 1651   MCHC 34.3 07/22/2017 1651   RDW 12.4 07/22/2017 1651   LYMPHSABS 1.9 03/23/2017 0832   MONOABS 0.4 03/23/2017 0832   EOSABS 0.1 03/23/2017 0832   BASOSABS 0.0 03/23/2017 0832   Dressing completely removed>>>mindark oozing R angle of transverse inc>>>edges intact  Good UOP  Will reinforce transverse incision T and XM 2 u PRBC Check CBC 8 pm and follow

## 2017-07-22 NOTE — Progress Notes (Signed)
NT attempted to get pt out of bed to ambulate. While sitting on the side of the bed, the pt stated that her head was hurting. Pt then passes out and NT safely gets her back in the bed and pulls the emergency cord. 5 RN's and the CRNA entered the room. Vital signs WDL and pt was responding appropriately to questions. When assessing abdominal dressing, both honeycombs underneath the pressure dressing were completely saturated and the incisions were still oozing. Pressure was applied. MD who performed surgery was paged. On call doctor was notified to let the pt's MD know about pt's current status. STAT CBC ordered until MD could arrive at the hospital. 500 cc bolus of LR given. New pressure dressing applied. Pt remains responsive and repeat of VS were WDL. MD came to assess pt at bedside.

## 2017-07-22 NOTE — Anesthesia Procedure Notes (Signed)
Procedure Name: Intubation Date/Time: 07/22/2017 7:30 AM Performed by: Elenore Paddy, CRNA Pre-anesthesia Checklist: Patient identified, Emergency Drugs available, Suction available, Patient being monitored and Timeout performed Patient Re-evaluated:Patient Re-evaluated prior to induction Oxygen Delivery Method: Circle system utilized Preoxygenation: Pre-oxygenation with 100% oxygen Induction Type: IV induction Ventilation: Mask ventilation without difficulty Grade View: Grade I Tube type: Oral Tube size: 7.0 mm Number of attempts: 1 Airway Equipment and Method: Stylet Placement Confirmation: ETT inserted through vocal cords under direct vision,  positive ETCO2 and CO2 detector Secured at: 22 cm Tube secured with: Tape Dental Injury: Teeth and Oropharynx as per pre-operative assessment

## 2017-07-22 NOTE — Anesthesia Postprocedure Evaluation (Signed)
Anesthesia Post Note  Patient: [name]  Procedure(s) Performed: HYSTERECTOMY ABDOMINAL WITH SALPINGECTOMY (Bilateral ) OOPHORECTOMY (Right )     Patient location during evaluation: [facility] Anesthesia Type: General Level of consciousness: awake, patient cooperative and oriented Pain management: satisfactory to patient Vital Signs Assessment: post-procedure vital signs reviewed and stable Respiratory status: spontaneous breathing and respiratory function stable Cardiovascular status: tachycardic Anesthetic complications: no Comments: I was on the unit to see Ms. [name] when the emergency alarms was activated. According to the nursing assistant, the patient fainted when she attempted to get her out of bed to walk.  I found her supine in the bed, staring upwards, seemingly frightened with cool clammy skin.Marland Kitchen  Her radial pulse was strong, respirations non labored and she responded appropriately to her name.  She said that she felt a rush of pressure to her head and then she blacked out. Her blood pressure was 119/67 and she was mildly tachycardic with a heart rate 105 - 115. She also had some bloody drainage from her dressing to which there nurses applied pressure.  Her surgeon was notified of the incident and coming to see her. I stayed with the patient until she and her husband were calmed down. Katherina Mires CRNA    Last Vitals:  Vitals:   07/22/17 1653 07/22/17 1711  BP: 127/67 123/70  Pulse: (!) 101 95  Resp: (!) 32 (!) 36  Temp: 37 C   SpO2:      Last Pain:  Vitals:   07/22/17 1653  TempSrc: Axillary  PainSc:    Pain Goal: Patients Stated Pain Goal: 3 (07/22/17 1540)               [name]

## 2017-07-22 NOTE — Op Note (Signed)
Preoperative diagnosis: Symptomatic leiomyoma  Postoperative diagnosis: Same  Procedure: TAH bilateral salpingectomy, right oophorectomy  Surgeon: Matthew Saras  Assistant: McComb  EBL: 750 cc  Procedure and findings  The patient was taken to the operating room after an adequate level of general anesthesia was obtained with the patient supine the abdomen prepped and draped, the vagina was prepped separately, Foley catheter was positioned.  Appropriate timeout was taken at that point.  Transverse Pfannenstiel incision was made carried down to the fascia which was incised and extended transversely, rectus muscle divided in the midline, peritoneum entered severely without incident and extended in a vertical fashion.  The rectus muscles were partially split to increase exposure.  Uterus was 20-week size, I was hopeful that we could reduce the mass through myomectomy to remove the bulk of the uterus, this was not possible, skin incision was then extended vertically to just below the umbilicus and the fascia incised vertically to increase the size.  Large 20-week uterus was then delivered completely bilateral ovaries were unremarkable.  Long Kelly clamps were then placed at each utero-ovarian pedicle starting on the left the round ligament was clamped divided and suture-ligated on each side.  The exact same repeated on the opposite side and the peritoneum carried around to the midline.  The bladder was advanced below with sharp and blunt dissection.  Left salpingectomy was then carried out by a clamp cut tie technique, the left ovary was conserved, once this pedicle was divided it was first free tie followed by suture ligature of 0 Vicryl.  The exact same repeated on the right side.  In sequential manner the uterine vasculature pedicle, cardinal ligament and uterosacral ligaments were clamped divided and suture-ligated close to the uterus.  Actually once the uterine vasculature pedicles were clamped, the large  fundus was excised for better exposure, cervical vaginal angles were then finished up by clamped and cut tied those were held temporarily the vaginal cuff was then closed with interrupted 2-0 Vicryl figure-of-eight sutures.  The pelvis was irrigated with saline made hemostatic with the Bovie.  Left ovary was suspended to the left round ligament for support.  Arista was placed in the vaginal cuff area for added hemostasis.  On further inspection there was continued oozing at the site of the right ovarian pedicle decision made to remove the right ovary after suture attempts could not get the bleeding to stop completing once the right IP ligament was clamped divided free tied and suture ligatured this area was inspected noted to be hemostatic no other abnormalities were noted prior to closure sponge, needle, instrument counts reported as correct x2.  Peritoneum closed with a running 3-0 Vicryl suture.  The same on the rectus muscles.  A double loop 0 PDS was then used to close the vertical portion of the fascia separately then transversely for the lower part.  This was irrigated.  Diluted Exparel solution was then used to inject subfascially and also subcutaneously.  3-0 Vicryl running suture was then used to reapproximate the Luna Fuse MD dead space both transversely and vertically and a 4-0 Monocryl transverse for the skin the same on the vertical part of the incision with a pressure dressing.  Steri-Strips were applied clear urine noted at the end the case she tolerated this well went to recovery room in good condition.  Dictated with [place] 1

## 2017-07-22 NOTE — Progress Notes (Signed)
The patient was re-examined with no change in status 

## 2017-07-22 NOTE — Transfer of Care (Signed)
Immediate Anesthesia Transfer of Care Note  Patient: [name]  Procedure(s) Performed: HYSTERECTOMY ABDOMINAL WITH SALPINGECTOMY (Bilateral ) OOPHORECTOMY (Right )  Patient Location: PACU  Anesthesia Type:General  Level of Consciousness: awake, alert  and oriented  Airway & Oxygen Therapy: Patient Spontanous Breathing and Patient connected to nasal cannula oxygen  Post-op Assessment: Report given to RN and Post -op Vital signs reviewed and stable  Post vital signs: Reviewed and stable HR 95, RR 18, SaO2 96%,Bp 129/77  Last Vitals:  Vitals Value Taken Time  BP    Temp    Pulse 92 07/22/2017  9:36 AM  Resp 18 07/22/2017  9:36 AM  SpO2 95 % 07/22/2017  9:36 AM  Vitals shown include unvalidated device data.  Last Pain:  Vitals:   07/22/17 0618  TempSrc: Oral      Patients Stated Pain Goal: 3 (57/97/28 2060)  Complications: No apparent anesthesia complications

## 2017-07-22 NOTE — Anesthesia Postprocedure Evaluation (Signed)
Anesthesia Post Note  Patient: [name]  Procedure(s) Performed: HYSTERECTOMY ABDOMINAL WITH SALPINGECTOMY (Bilateral ) OOPHORECTOMY (Right )     Patient location during evaluation: PACU Anesthesia Type: General Level of consciousness: awake and alert Pain management: pain level controlled Vital Signs Assessment: post-procedure vital signs reviewed and stable Respiratory status: spontaneous breathing, nonlabored ventilation, respiratory function stable and patient connected to nasal cannula oxygen Cardiovascular status: blood pressure returned to baseline and stable Postop Assessment: no apparent nausea or vomiting Anesthetic complications: no    Last Vitals:  Vitals:   07/22/17 1339 07/22/17 1345  BP: 101/61   Pulse: 83   Resp: (!) 21 (!) 21  Temp: 36.7 C   SpO2: 100% 100%    Last Pain:  Vitals:   07/22/17 1345  TempSrc:   PainSc: 5    Pain Goal: Patients Stated Pain Goal: 3 (07/22/17 1230)               Effie Berkshire

## 2017-07-22 NOTE — Progress Notes (Signed)
New dressing applied.

## 2017-07-22 NOTE — Anesthesia Preprocedure Evaluation (Addendum)
Anesthesia Evaluation  Patient identified by MRN, date of birth, ID band Patient awake    Reviewed: Allergy & Precautions, NPO status , Patient's Chart, lab work & pertinent test results  Airway Mallampati: I  TM Distance: >3 FB Neck ROM: Full    Dental  (+) Teeth Intact, Dental Advisory Given   Pulmonary neg pulmonary ROS,    breath sounds clear to auscultation       Cardiovascular hypertension, Pt. on medications + Peripheral Vascular Disease   Rhythm:Regular Rate:Normal     Neuro/Psych negative neurological ROS  negative psych ROS   GI/Hepatic negative GI ROS, Neg liver ROS,   Endo/Other  negative endocrine ROS  Renal/GU negative Renal ROS     Musculoskeletal negative musculoskeletal ROS (+)   Abdominal Normal abdominal exam  (+)   Peds  Hematology negative hematology ROS (+)   Anesthesia Other Findings   Reproductive/Obstetrics                            Lab Results  Component Value Date   WBC 3.5 (L) 07/09/2017   HGB 13.8 07/09/2017   HCT 40.5 07/09/2017   MCV 89.6 07/09/2017   PLT 240 07/09/2017   Lab Results  Component Value Date   CREATININE 0.84 07/09/2017   BUN 9 07/09/2017   NA 136 07/09/2017   K 3.4 (L) 07/09/2017   CL 102 07/09/2017   CO2 25 07/09/2017   No results found for: INR, PROTIME  EKG: normal EKG, normal sinus rhythm.   Anesthesia Physical Anesthesia Plan  ASA: II  Anesthesia Plan: General   Post-op Pain Management:    Induction: Intravenous  PONV Risk Score and Plan: 4 or greater and Ondansetron, Dexamethasone, Midazolam and Scopolamine patch - Pre-op  Airway Management Planned: Oral ETT  Additional Equipment: None  Intra-op Plan:   Post-operative Plan: Extubation in OR  Informed Consent: I have reviewed the patients History and Physical, chart, labs and discussed the procedure including the risks, benefits and alternatives for the  proposed anesthesia with the patient or authorized representative who has indicated his/her understanding and acceptance.   Dental advisory given  Plan Discussed with: CRNA  Anesthesia Plan Comments:        Anesthesia Quick Evaluation

## 2017-07-22 NOTE — Addendum Note (Signed)
Addendum  created 07/22/17 1748 by Flossie Dibble, CRNA   Sign clinical note

## 2017-07-23 ENCOUNTER — Encounter (HOSPITAL_COMMUNITY): Payer: Self-pay | Admitting: Obstetrics and Gynecology

## 2017-07-23 LAB — CBC
HEMATOCRIT: 19.7 % — AB (ref 36.0–46.0)
Hemoglobin: 6.7 g/dL — CL (ref 12.0–15.0)
MCH: 31.3 pg (ref 26.0–34.0)
MCHC: 34 g/dL (ref 30.0–36.0)
MCV: 92.1 fL (ref 78.0–100.0)
Platelets: 167 10*3/uL (ref 150–400)
RBC: 2.14 MIL/uL — ABNORMAL LOW (ref 3.87–5.11)
RDW: 12.9 % (ref 11.5–15.5)
WBC: 8.9 10*3/uL (ref 4.0–10.5)

## 2017-07-23 LAB — CBC WITH DIFFERENTIAL/PLATELET
Basophils Absolute: 0 10*3/uL (ref 0.0–0.1)
Basophils Relative: 0 %
EOS PCT: 0 %
Eosinophils Absolute: 0 10*3/uL (ref 0.0–0.7)
HCT: 22 % — ABNORMAL LOW (ref 36.0–46.0)
Hemoglobin: 7.4 g/dL — ABNORMAL LOW (ref 12.0–15.0)
LYMPHS ABS: 2.1 10*3/uL (ref 0.7–4.0)
LYMPHS PCT: 25 %
MCH: 30.7 pg (ref 26.0–34.0)
MCHC: 33.6 g/dL (ref 30.0–36.0)
MCV: 91.3 fL (ref 78.0–100.0)
MONOS PCT: 7 %
Monocytes Absolute: 0.6 10*3/uL (ref 0.1–1.0)
Neutro Abs: 5.5 10*3/uL (ref 1.7–7.7)
Neutrophils Relative %: 68 %
PLATELETS: 166 10*3/uL (ref 150–400)
RBC: 2.41 MIL/uL — AB (ref 3.87–5.11)
RDW: 12.7 % (ref 11.5–15.5)
WBC: 8.2 10*3/uL (ref 4.0–10.5)

## 2017-07-23 LAB — PREPARE RBC (CROSSMATCH)

## 2017-07-23 MED ORDER — OXYCODONE-ACETAMINOPHEN 5-325 MG PO TABS
1.0000 | ORAL_TABLET | ORAL | Status: DC | PRN
  Administered 2017-07-23 – 2017-07-24 (×6): 2 via ORAL
  Filled 2017-07-23 (×6): qty 2

## 2017-07-23 MED ORDER — DIPHENHYDRAMINE HCL 25 MG PO CAPS
25.0000 mg | ORAL_CAPSULE | Freq: Once | ORAL | Status: AC
  Administered 2017-07-23: 25 mg via ORAL
  Filled 2017-07-23: qty 1

## 2017-07-23 MED ORDER — SODIUM CHLORIDE 0.9 % IV SOLN
Freq: Once | INTRAVENOUS | Status: AC
  Administered 2017-07-23: 18:00:00 via INTRAVENOUS

## 2017-07-23 NOTE — Progress Notes (Signed)
Pt ambulating w/o orthostasis>>>dressing dry>>>  BP 124/62 (BP Location: Left Arm)   Pulse (!) 103   Temp 98.9 F (37.2 C) (Oral)   Resp (!) 22   Ht 5\' 6"  (1.676 m)   Wt 214 lb (97.1 kg)   LMP 06/25/2017   SpO2 94%   BMI 34.54 kg/m  CBC    Component Value Date/Time   WBC 8.9 07/23/2017 1418   RBC 2.14 (L) 07/23/2017 1418   HGB 6.7 (LL) 07/23/2017 1418   HCT 19.7 (L) 07/23/2017 1418   PLT 167 07/23/2017 1418   MCV 92.1 07/23/2017 1418   MCH 31.3 07/23/2017 1418   MCHC 34.0 07/23/2017 1418   RDW 12.9 07/23/2017 1418   LYMPHSABS 2.1 07/23/2017 0553   MONOABS 0.6 07/23/2017 0553   EOSABS 0.0 07/23/2017 0553   BASOSABS 0.0 07/23/2017 0553   rec 2 u PRBC>>proced + risks discussed>>>transf 2 u PRBC

## 2017-07-23 NOTE — Progress Notes (Signed)
CRITICAL VALUE ALERT  Critical Value:  Hgb 6.7  Date & Time Notied:  07/23/2017 1533  Provider Notified: Dr. Matthew Saras 07/23/2017 1536  Orders Received/Actions taken: Will follow up with Patient

## 2017-07-23 NOTE — Progress Notes (Signed)
1 Day Post-Op Procedure(s) (LRB): HYSTERECTOMY ABDOMINAL WITH SALPINGECTOMY (Bilateral) OOPHORECTOMY (Right)  Subjective: Patient reports tolerating PO.  , feeling better this am  Objective: I have reviewed patient's vital signs, intake and output, medications and labs. BP 131/73   Pulse 99   Temp 98.9 F (37.2 C)   Resp (!) 30   Ht 5\' 6"  (1.676 m)   Wt 214 lb (97.1 kg)   LMP 06/25/2017   SpO2 96%   BMI 34.54 kg/m   CBC    Component Value Date/Time   WBC 8.2 07/23/2017 0553   RBC 2.41 (L) 07/23/2017 0553   HGB 7.4 (L) 07/23/2017 0553   HCT 22.0 (L) 07/23/2017 0553   PLT 166 07/23/2017 0553   MCV 91.3 07/23/2017 0553   MCH 30.7 07/23/2017 0553   MCHC 33.6 07/23/2017 0553   RDW 12.7 07/23/2017 0553   LYMPHSABS 2.1 07/23/2017 0553   MONOABS 0.6 07/23/2017 0553   EOSABS 0.0 07/23/2017 0553   BASOSABS 0.0 07/23/2017 0553    abd soft + BS, dressing dry    Assessment: s/p Procedure(s): HYSTERECTOMY ABDOMINAL WITH SALPINGECTOMY (Bilateral) OOPHORECTOMY (Right): post op anemia, no current Inc drainage or ext vag bleeding  EBL @ surgery was 750  Plan: CBC @ 2 pm today, consider transfusion if orthostatic  LOS: 1 day   Will D/C foley and ambulate Reg diet   [name] 07/23/2017, 7:15 AM

## 2017-07-23 NOTE — Plan of Care (Signed)
Pt. Will continue to improve

## 2017-07-24 ENCOUNTER — Telehealth: Payer: Self-pay | Admitting: Family Medicine

## 2017-07-24 LAB — CBC
HEMATOCRIT: 25.7 % — AB (ref 36.0–46.0)
Hemoglobin: 8.7 g/dL — ABNORMAL LOW (ref 12.0–15.0)
MCH: 30.5 pg (ref 26.0–34.0)
MCHC: 33.9 g/dL (ref 30.0–36.0)
MCV: 90.2 fL (ref 78.0–100.0)
PLATELETS: 159 10*3/uL (ref 150–400)
RBC: 2.85 MIL/uL — AB (ref 3.87–5.11)
RDW: 13.8 % (ref 11.5–15.5)
WBC: 9 10*3/uL (ref 4.0–10.5)

## 2017-07-24 LAB — TYPE AND SCREEN
ABO/RH(D): AB POS
ANTIBODY SCREEN: NEGATIVE
UNIT DIVISION: 0
Unit division: 0

## 2017-07-24 LAB — BPAM RBC
BLOOD PRODUCT EXPIRATION DATE: 201904222359
Blood Product Expiration Date: 201904232359
ISSUE DATE / TIME: 201903221829
ISSUE DATE / TIME: 201903222240
UNIT TYPE AND RH: 6200
Unit Type and Rh: 6200

## 2017-07-24 MED ORDER — IRON 325 (65 FE) MG PO TABS: 1.0000 | ORAL_TABLET | Freq: Every morning | ORAL | 1 refills | Status: DC

## 2017-07-24 MED ORDER — OXYCODONE-ACETAMINOPHEN 5-325 MG PO TABS: 1.0000 | ORAL_TABLET | ORAL | 0 refills | Status: DC | PRN

## 2017-07-24 MED ORDER — IBUPROFEN 800 MG PO TABS: 800.0000 mg | ORAL_TABLET | Freq: Three times a day (TID) | ORAL | 0 refills | Status: DC | PRN

## 2017-07-24 NOTE — Progress Notes (Signed)
2 Days Post-Op Procedure(s) (LRB): HYSTERECTOMY ABDOMINAL WITH SALPINGECTOMY (Bilateral) OOPHORECTOMY (Right)  Subjective: Patient reports tolerating PO and + flatus.    Objective: I have reviewed patient's vital signs, medications and labs. BP 121/71 (BP Location: Left Arm)   Pulse 85   Temp 98.4 F (36.9 C) (Oral)   Resp 18   Ht 5\' 6"  (1.676 m)   Wt 214 lb (97.1 kg)   LMP 06/25/2017   SpO2 96%   BMI 34.54 kg/m  CBC    Component Value Date/Time   WBC 9.0 07/24/2017 0619   RBC 2.85 (L) 07/24/2017 0619   HGB 8.7 (L) 07/24/2017 0619   HCT 25.7 (L) 07/24/2017 0619   PLT 159 07/24/2017 0619   MCV 90.2 07/24/2017 0619   MCH 30.5 07/24/2017 0619   MCHC 33.9 07/24/2017 0619   RDW 13.8 07/24/2017 0619   LYMPHSABS 2.1 07/23/2017 0553   MONOABS 0.6 07/23/2017 0553   EOSABS 0.0 07/23/2017 0553   BASOSABS 0.0 07/23/2017 0553    Dressing completely removed and cleaned>>skin edges intact, min serosang D/C, no evidence of cellulitis>>instructed in dressing changes and light dressing applied   abd + BS, soft  Inc as above  Assessment: s/p Procedure(s): HYSTERECTOMY ABDOMINAL WITH SALPINGECTOMY (Bilateral) OOPHORECTOMY (Right): stable, Hgb 8.7 after 2 u PRBC  Plan: Discharge home, will RTC 3 days for Inc check and CBC  LOS: 2 days    Margarette Asal 07/24/2017, 7:29 AM

## 2017-07-24 NOTE — Discharge Summary (Signed)
Physician Discharge Summary  Patient ID: [name] MRN: [id number] DOB/AGE: May 09, 1965 52 y.o.  Admit date: 07/22/2017 Discharge date: 07/24/2017  Admission Diagnoses:Leiomyoma and anemia  Discharge Diagnoses: same + post op anemia/transfusion Active Problems:   Leiomyoma   Discharged Condition: good  Hospital Course: adm for TAH, BS for large symptomatic fibroids, had TAH RSO , uterus 2730 grams, on POD 0, had incisional bleeding requiring pressure dressing, maintained normal VS, but required 2 u PRBC on POD #1, remained afeb and tol Reg diet On POD 2, Hgb 8.7, dressing changed with min serosang D/c  Consults: None  Significant Diagnostic Studies: labs:  CBC    Component Value Date/Time   WBC 9.0 07/24/2017 0619   RBC 2.85 (L) 07/24/2017 0619   HGB 8.7 (L) 07/24/2017 0619   HCT 25.7 (L) 07/24/2017 0619   PLT 159 07/24/2017 0619   MCV 90.2 07/24/2017 0619   MCH 30.5 07/24/2017 0619   MCHC 33.9 07/24/2017 0619   RDW 13.8 07/24/2017 0619   LYMPHSABS 2.1 07/23/2017 0553   MONOABS 0.6 07/23/2017 0553   EOSABS 0.0 07/23/2017 0553   BASOSABS 0.0 07/23/2017 0553     Treatments: surgery: TAH RSO, L salpingectomy  Discharge Exam: Blood pressure 121/71, pulse 85, temperature 98.4 F (36.9 C), temperature source Oral, resp. rate 18, height 5\' 6"  (1.676 m), weight 214 lb (97.1 kg), last menstrual period 06/25/2017, SpO2 96 %. General appearance: no distress Lungs clear Abd + BS Inc edges intact, min serosang drainage, no evidence of cellulitis Disposition: Discharge disposition: 01-Home or Self Care         Follow-up Information    Molli Posey, MD Follow up.   Specialty:  Obstetrics and Gynecology Why:  office will call for appt 3 days for exam Contact information: [address]  [phone]           Signed: Margarette Asal 07/24/2017, 7:41 AM

## 2017-07-24 NOTE — Telephone Encounter (Signed)
Noted. plz notify - we were also getting abd [place] to follow gallbladder polyp noted on [place] 2016. Recent hysterectomy would not have evaluated this.  Up to her if she wants to proceed with [place] or not.  Regardless do recommend rpt [place] 2021 - to monitor mild dilation of abdominal aorta noted 2016.

## 2017-07-26 NOTE — Telephone Encounter (Signed)
Tried to reach patient.  No answer and voice mail was full, unable to leave message.  Will have to try again later.

## 2017-07-27 DIAGNOSIS — D509 Iron deficiency anemia, unspecified: Secondary | ICD-10-CM | POA: Diagnosis not present

## 2017-07-27 NOTE — Telephone Encounter (Signed)
Spoke with pt relaying message per Dr. Darnell Level.  Pt verbalizes understanding and says she agrees to have repeat US in 2021.

## 2017-08-03 DIAGNOSIS — D509 Iron deficiency anemia, unspecified: Secondary | ICD-10-CM | POA: Diagnosis not present

## 2017-08-11 DIAGNOSIS — D509 Iron deficiency anemia, unspecified: Secondary | ICD-10-CM | POA: Diagnosis not present

## 2017-12-16 DIAGNOSIS — L821 Other seborrheic keratosis: Secondary | ICD-10-CM | POA: Diagnosis not present

## 2017-12-16 DIAGNOSIS — L648 Other androgenic alopecia: Secondary | ICD-10-CM | POA: Diagnosis not present

## 2017-12-20 DIAGNOSIS — R0683 Snoring: Secondary | ICD-10-CM | POA: Diagnosis not present

## 2017-12-20 DIAGNOSIS — I10 Essential (primary) hypertension: Secondary | ICD-10-CM | POA: Diagnosis not present

## 2018-05-24 ENCOUNTER — Other Ambulatory Visit: Payer: Self-pay | Admitting: Family Medicine

## 2018-05-24 DIAGNOSIS — K760 Fatty (change of) liver, not elsewhere classified: Secondary | ICD-10-CM

## 2018-05-24 DIAGNOSIS — E559 Vitamin D deficiency, unspecified: Secondary | ICD-10-CM

## 2018-05-24 DIAGNOSIS — D709 Neutropenia, unspecified: Secondary | ICD-10-CM

## 2018-05-24 DIAGNOSIS — I10 Essential (primary) hypertension: Secondary | ICD-10-CM

## 2018-05-25 ENCOUNTER — Other Ambulatory Visit: Payer: Federal, State, Local not specified - PPO

## 2018-05-30 ENCOUNTER — Encounter: Payer: Self-pay | Admitting: Family Medicine

## 2018-05-30 ENCOUNTER — Ambulatory Visit (INDEPENDENT_AMBULATORY_CARE_PROVIDER_SITE_OTHER): Payer: Federal, State, Local not specified - PPO | Admitting: Family Medicine

## 2018-05-30 VITALS — BP 136/84 | HR 91 | Temp 98.4°F | Ht 65.5 in | Wt 213.8 lb

## 2018-05-30 DIAGNOSIS — Z1211 Encounter for screening for malignant neoplasm of colon: Secondary | ICD-10-CM

## 2018-05-30 DIAGNOSIS — R200 Anesthesia of skin: Secondary | ICD-10-CM | POA: Diagnosis not present

## 2018-05-30 DIAGNOSIS — Z0000 Encounter for general adult medical examination without abnormal findings: Secondary | ICD-10-CM

## 2018-05-30 DIAGNOSIS — I10 Essential (primary) hypertension: Secondary | ICD-10-CM

## 2018-05-30 DIAGNOSIS — Z6835 Body mass index (BMI) 35.0-35.9, adult: Secondary | ICD-10-CM

## 2018-05-30 DIAGNOSIS — I77811 Abdominal aortic ectasia: Secondary | ICD-10-CM

## 2018-05-30 DIAGNOSIS — D709 Neutropenia, unspecified: Secondary | ICD-10-CM | POA: Diagnosis not present

## 2018-05-30 DIAGNOSIS — K760 Fatty (change of) liver, not elsewhere classified: Secondary | ICD-10-CM | POA: Diagnosis not present

## 2018-05-30 DIAGNOSIS — E559 Vitamin D deficiency, unspecified: Secondary | ICD-10-CM

## 2018-05-30 DIAGNOSIS — Z90710 Acquired absence of both cervix and uterus: Secondary | ICD-10-CM

## 2018-05-30 DIAGNOSIS — C44599 Other specified malignant neoplasm of skin of other part of trunk: Secondary | ICD-10-CM

## 2018-05-30 LAB — CBC WITH DIFFERENTIAL/PLATELET
BASOS ABS: 0 10*3/uL (ref 0.0–0.1)
BASOS PCT: 1 % (ref 0.0–3.0)
Eosinophils Absolute: 0.1 10*3/uL (ref 0.0–0.7)
Eosinophils Relative: 1.9 % (ref 0.0–5.0)
HCT: 41.1 % (ref 36.0–46.0)
Hemoglobin: 14.1 g/dL (ref 12.0–15.0)
LYMPHS ABS: 2.9 10*3/uL (ref 0.7–4.0)
Lymphocytes Relative: 60.6 % — ABNORMAL HIGH (ref 12.0–46.0)
MCHC: 34.3 g/dL (ref 30.0–36.0)
MCV: 88.7 fl (ref 78.0–100.0)
MONOS PCT: 6 % (ref 3.0–12.0)
Monocytes Absolute: 0.3 10*3/uL (ref 0.1–1.0)
NEUTROS ABS: 1.4 10*3/uL (ref 1.4–7.7)
NEUTROS PCT: 30.5 % — AB (ref 43.0–77.0)
PLATELETS: 237 10*3/uL (ref 150.0–400.0)
RBC: 4.63 Mil/uL (ref 3.87–5.11)
RDW: 12.3 % (ref 11.5–15.5)
WBC: 4.7 10*3/uL (ref 4.0–10.5)

## 2018-05-30 LAB — COMPREHENSIVE METABOLIC PANEL
ALK PHOS: 60 U/L (ref 39–117)
ALT: 19 U/L (ref 0–35)
AST: 21 U/L (ref 0–37)
Albumin: 4.7 g/dL (ref 3.5–5.2)
BILIRUBIN TOTAL: 0.4 mg/dL (ref 0.2–1.2)
BUN: 10 mg/dL (ref 6–23)
CALCIUM: 9.8 mg/dL (ref 8.4–10.5)
CO2: 29 mEq/L (ref 19–32)
CREATININE: 0.91 mg/dL (ref 0.40–1.20)
Chloride: 101 mEq/L (ref 96–112)
GFR: 79.05 mL/min (ref >60.00)
GLUCOSE: 85 mg/dL (ref 70–99)
Potassium: 3.9 mEq/L (ref 3.5–5.1)
Sodium: 138 mEq/L (ref 135–145)
TOTAL PROTEIN: 8 g/dL (ref 6.0–8.3)

## 2018-05-30 LAB — LIPID PANEL
Cholesterol: 198 mg/dL (ref 0–200)
HDL: 53.7 mg/dL (ref >39.00)
LDL Cholesterol: 109 mg/dL — ABNORMAL HIGH (ref 0–99)
NonHDL: 144.04
TRIGLYCERIDES: 173 mg/dL — AB (ref 0.0–149.0)
Total CHOL/HDL Ratio: 4
VLDL: 34.6 mg/dL (ref 0.0–40.0)

## 2018-05-30 LAB — TSH: TSH: 1.43 u[IU]/mL (ref 0.35–4.50)

## 2018-05-30 LAB — T4, FREE: FREE T4: 0.93 ng/dL (ref 0.60–1.60)

## 2018-05-30 LAB — MICROALBUMIN / CREATININE URINE RATIO
CREATININE, U: 25.6 mg/dL
MICROALB/CREAT RATIO: 2.7 mg/g (ref 0.0–30.0)
Microalb, Ur: <0.7 mg/dL (ref 0.0–1.9)

## 2018-05-30 LAB — VITAMIN D 25 HYDROXY (VIT D DEFICIENCY, FRACTURES): VITD: 32.22 ng/mL (ref 30.00–100.00)

## 2018-05-30 LAB — FERRITIN: Ferritin: 85.4 ng/mL (ref 10.0–291.0)

## 2018-05-30 MED ORDER — FLUTICASONE PROPIONATE 50 MCG/ACT NA SUSP: 2.0000 | Freq: Every day | NASAL | 3 refills | Status: DC

## 2018-05-30 MED ORDER — AMLODIPINE BESYLATE 5 MG PO TABS: 5.0000 mg | ORAL_TABLET | Freq: Every day | ORAL | 4 refills | Status: DC

## 2018-05-30 NOTE — Assessment & Plan Note (Addendum)
Update cbc. H/o lymphocytic leukopenia.

## 2018-05-30 NOTE — Assessment & Plan Note (Addendum)
Predominantly L hand.  Negative testing for CTS. Benign exam today.  Check TSH, free T4

## 2018-05-30 NOTE — Assessment & Plan Note (Signed)
Update levels. Currently off replacement.

## 2018-05-30 NOTE — Assessment & Plan Note (Signed)
Encouraged healthy diet and lifestyle changes to affect sustainable weight loss.  

## 2018-05-30 NOTE — Assessment & Plan Note (Signed)
Preventative protocols reviewed and updated unless pt declined. Discussed healthy diet and lifestyle.  

## 2018-05-30 NOTE — Assessment & Plan Note (Signed)
TAH and RSO for fibroids 07/2017

## 2018-05-30 NOTE — Patient Instructions (Addendum)
We will refer you to GI for colonoscopy.  Sign form for mammogram from [facility] office (OB[facility]N) [facility] today.  You are doing well today. Return as needed or in 1 year for next physical.  Health Maintenance, Female Adopting a healthy lifestyle and getting preventive care can go a long way to promote health and wellness. Talk with your health care provider about what schedule of regular examinations is right for you. This is a good chance for you to check in with your provider about disease prevention and staying healthy. In between checkups, there are plenty of things you can do on your own. Experts have done a lot of research about which lifestyle changes and preventive measures are most likely to keep you healthy. Ask your health care provider for more information. Weight and diet Eat a healthy diet  Be sure to include plenty of vegetables, fruits, low-fat dairy products, and lean protein.  Do not eat a lot of foods high in solid fats, added sugars, or salt.  Get regular exercise. This is one of the most important things you can do for your health. ? Most adults should exercise for at least 150 minutes each week. The exercise should increase your heart rate and make you sweat (moderate-intensity exercise). ? Most adults should also do strengthening exercises at least twice a week. This is in addition to the moderate-intensity exercise. Maintain a healthy weight  Body mass index (BMI) is a measurement that can be used to identify possible weight problems. It estimates body fat based on height and weight. Your health care provider can help determine your BMI and help you achieve or maintain a healthy weight.  For females 64 years of age and older: ? A BMI below 18.5 is considered underweight. ? A BMI of 18.5 to 24.9 is normal. ? A BMI of 25 to 29.9 is considered overweight. ? A BMI of 30 and above is considered obese. Watch levels of cholesterol and blood lipids  You should start having  your blood tested for lipids and cholesterol at 51 years of age, then have this test every 5 years.  You may need to have your cholesterol levels checked more often if: ? Your lipid or cholesterol levels are high. ? You are older than 51 years of age. ? You are at high risk for heart disease. Cancer screening Lung Cancer  Lung cancer screening is recommended for adults 42-58 years old who are at high risk for lung cancer because of a history of smoking.  A yearly low-dose CT scan of the lungs is recommended for people who: ? Currently smoke. ? Have quit within the past 15 years. ? Have at least a 30-pack-year history of smoking. A pack year is smoking an average of one pack of cigarettes a day for 1 year.  Yearly screening should continue until it has been 15 years since you quit.  Yearly screening should stop if you develop a health problem that would prevent you from having lung cancer treatment. Breast Cancer  Practice breast self-awareness. This means understanding how your breasts normally appear and feel.  It also means doing regular breast self-exams. Let your health care provider know about any changes, no matter how small.  If you are in your 20s or 30s, you should have a clinical breast exam (CBE) by a health care provider every 1-3 years as part of a regular health exam.  If you are 83 or older, have a CBE every year. Also consider  having a breast X-ray (mammogram) every year.  If you have a family history of breast cancer, talk to your health care provider about genetic screening.  If you are at high risk for breast cancer, talk to your health care provider about having an MRI and a mammogram every year.  Breast cancer gene (BRCA) assessment is recommended for women who have family members with BRCA-related cancers. BRCA-related cancers include: ? Breast. ? Ovarian. ? Tubal. ? Peritoneal cancers.  Results of the assessment will determine the need for genetic  counseling and BRCA1 and BRCA2 testing. Cervical Cancer Your health care provider may recommend that you be screened regularly for cancer of the pelvic organs (ovaries, uterus, and vagina). This screening involves a pelvic examination, including checking for microscopic changes to the surface of your cervix (Pap test). You may be encouraged to have this screening done every 3 years, beginning at age 10.  For women ages 73-65, health care providers may recommend pelvic exams and Pap testing every 3 years, or they may recommend the Pap and pelvic exam, combined with testing for human papilloma virus (HPV), every 5 years. Some types of HPV increase your risk of cervical cancer. Testing for HPV may also be done on women of any age with unclear Pap test results.  Other health care providers may not recommend any screening for nonpregnant women who are considered low risk for pelvic cancer and who do not have symptoms. Ask your health care provider if a screening pelvic exam is right for you.  If you have had past treatment for cervical cancer or a condition that could lead to cancer, you need Pap tests and screening for cancer for at least 20 years after your treatment. If Pap tests have been discontinued, your risk factors (such as having a new sexual partner) need to be reassessed to determine if screening should resume. Some women have medical problems that increase the chance of getting cervical cancer. In these cases, your health care provider may recommend more frequent screening and Pap tests. Colorectal Cancer  This type of cancer can be detected and often prevented.  Routine colorectal cancer screening usually begins at 51 years of age and continues through 51 years of age.  Your health care provider may recommend screening at an earlier age if you have risk factors for colon cancer.  Your health care provider may also recommend using home test kits to check for hidden blood in the stool.  A  small camera at the end of a tube can be used to examine your colon directly (sigmoidoscopy or colonoscopy). This is done to check for the earliest forms of colorectal cancer.  Routine screening usually begins at age 74.  Direct examination of the colon should be repeated every 5-10 years through 51 years of age. However, you may need to be screened more often if early forms of precancerous polyps or small growths are found. Skin Cancer  Check your skin from head to toe regularly.  Tell your health care provider about any new moles or changes in moles, especially if there is a change in a mole's shape or color.  Also tell your health care provider if you have a mole that is larger than the size of a pencil eraser.  Always use sunscreen. Apply sunscreen liberally and repeatedly throughout the day.  Protect yourself by wearing long sleeves, pants, a wide-brimmed hat, and sunglasses whenever you are outside. Heart disease, diabetes, and high blood pressure  High blood pressure  causes heart disease and increases the risk of stroke. High blood pressure is more likely to develop in: ? People who have blood pressure in the high end of the normal range (130-139/85-89 mm Hg). ? People who are overweight or obese. ? People who are African American.  If you are 21-52 years of age, have your blood pressure checked every 3-5 years. If you are 13 years of age or older, have your blood pressure checked every year. You should have your blood pressure measured twice-once when you are at a hospital or clinic, and once when you are not at a hospital or clinic. Record the average of the two measurements. To check your blood pressure when you are not at a hospital or clinic, you can use: ? An automated blood pressure machine at a pharmacy. ? A home blood pressure monitor.  If you are between 30 years and 86 years old, ask your health care provider if you should take aspirin to prevent strokes.  Have regular  diabetes screenings. This involves taking a blood sample to check your fasting blood sugar level. ? If you are at a normal weight and have a low risk for diabetes, have this test once every three years after 51 years of age. ? If you are overweight and have a high risk for diabetes, consider being tested at a younger age or more often. Preventing infection Hepatitis B  If you have a higher risk for hepatitis B, you should be screened for this virus. You are considered at high risk for hepatitis B if: ? You were born in a country where hepatitis B is common. Ask your health care provider which countries are considered high risk. ? Your parents were born in a high-risk country, and you have not been immunized against hepatitis B (hepatitis B vaccine). ? You have HIV or AIDS. ? You use needles to inject street drugs. ? You live with someone who has hepatitis B. ? You have had sex with someone who has hepatitis B. ? You get hemodialysis treatment. ? You take certain medicines for conditions, including cancer, organ transplantation, and autoimmune conditions. Hepatitis C  Blood testing is recommended for: ? Everyone born from [birth year] through 1963. ? Anyone with known risk factors for hepatitis C. Sexually transmitted infections (STIs)  You should be screened for sexually transmitted infections (STIs) including gonorrhea and chlamydia if: ? You are sexually active and are younger than 51 years of age. ? You are older than 51 years of age and your health care provider tells you that you are at risk for this type of infection. ? Your sexual activity has changed since you were last screened and you are at an increased risk for chlamydia or gonorrhea. Ask your health care provider if you are at risk.  If you do not have HIV, but are at risk, it may be recommended that you take a prescription medicine daily to prevent HIV infection. This is called pre-exposure prophylaxis (PrEP). You are considered at  risk if: ? You are sexually active and do not regularly use condoms or know the HIV status of your partner(s). ? You take drugs by injection. ? You are sexually active with a partner who has HIV. Talk with your health care provider about whether you are at high risk of being infected with HIV. If you choose to begin PrEP, you should first be tested for HIV. You should then be tested every 3 months for as long as you are  taking PrEP. Pregnancy  If you are premenopausal and you may become pregnant, ask your health care provider about preconception counseling.  If you may become pregnant, take 400 to 800 micrograms (mcg) of folic acid every day.  If you want to prevent pregnancy, talk to your health care provider about birth control (contraception). Osteoporosis and menopause  Osteoporosis is a disease in which the bones lose minerals and strength with aging. This can result in serious bone fractures. Your risk for osteoporosis can be identified using a bone density scan.  If you are 51 years of age or older, or if you are at risk for osteoporosis and fractures, ask your health care provider if you should be screened.  Ask your health care provider whether you should take a calcium or vitamin D supplement to lower your risk for osteoporosis.  Menopause may have certain physical symptoms and risks.  Hormone replacement therapy may reduce some of these symptoms and risks. Talk to your health care provider about whether hormone replacement therapy is right for you. Follow these instructions at home:  Schedule regular health, dental, and eye exams.  Stay current with your immunizations.  Do not use any tobacco products including cigarettes, chewing tobacco, or electronic cigarettes.  If you are pregnant, do not drink alcohol.  If you are breastfeeding, limit how much and how often you drink alcohol.  Limit alcohol intake to no more than 1 drink per day for nonpregnant women. One drink  equals 12 ounces of beer, 5 ounces of wine, or 1 ounces of hard liquor.  Do not use street drugs.  Do not share needles.  Ask your health care provider for help if you need support or information about quitting drugs.  Tell your health care provider if you often feel depressed.  Tell your health care provider if you have ever been abused or do not feel safe at home. This information is not intended to replace advice given to you by your health care provider. Make sure you discuss any questions you have with your health care provider. Document Released: 11/03/2010 Document Revised: 09/26/2015 Document Reviewed: 01/22/2015 Elsevier Interactive Patient Education  2019 [employer].

## 2018-05-30 NOTE — Progress Notes (Signed)
BP 136/84 (BP Location: Left Arm, Patient Position: Sitting, Cuff Size: Large)   Pulse 91   Temp 98.4 F (36.9 C) (Oral)   Ht 5' 5.5" (1.664 m)   Wt 213 lb 12 oz (97 kg)   LMP  (Within Months)   SpO2 97%   BMI 35.03 kg/m    CC: CPE Subjective:    Patient ID: [name], female    DOB: 11-18-1965, 53 y.o.   MRN: [id number]  HPI: [name] is a 53 y.o. 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   Gallbladder polyp noted on abd [place] 2016 (71mm). Also noted ectatic abd aorta - rec rpt [place] 5 yrs.   Ongoing L hand numbness (palm).   Preventative: Colon cancer screening - discussed options, requests colonoscopy.  [facility] [place] (normal pap per patient), h/o endometrial ablation. S/p TAH and RSO 07/2017 for fibroids. Did not take hormonal treatment.  Mammogram - 07/2017 normal (at [facility] office).gets this done yearly.  LMP - regular periods until hysterectomy 07/2017. Occasional hot flashes.  Flu - declines Tdap - declines Seat belt use discussed Sunscreen use discussed. No changing moles on skin. Non smoker Alcohol - rare Dentist q69mo  Eye exam yearly  Caffeine: 1 cup coffee/day Lives with husband, 3 sons Occupation: works for [employer], started going back to school Activity: going to gym 3x/wk- cardio Diet: good water, fruits and vegetables daily     Relevant past medical, surgical, family and social history reviewed and updated as indicated. Interim medical history since our last visit reviewed. Allergies and medications reviewed and updated. Outpatient Medications Prior to Visit  Medication Sig Dispense Refill  . Cholecalciferol (VITAMIN D3) 50000 units TABS Take 1 tablet by mouth once a week.  (Patient taking differently: Take 1 tablet by mouth every Sunday. ) 12 tablet 1  . ibuprofen (ADVIL,MOTRIN) 800 MG tablet Take 1 tablet (800 mg total) by mouth every 8 (eight) hours as needed (mild pain). 30 tablet 0  . levocetirizine (XYZAL) 5 MG tablet Take 1 tablet (5 mg total) by mouth every evening. 30 tablet 11  . amLODipine (NORVASC) 5 MG tablet Take 1 tablet (5 mg total) by mouth daily. 90 tablet 3  . fluticasone (FLONASE) 50 MCG/ACT nasal spray USE TWO SPRAY(S) IN EACH NOSTRIL ONCE DAILY (Patient taking differently: USE TWO SPRAY(S) IN EACH NOSTRIL ONCE DAILY AT NIGHT) 16 g 11  . Ferrous Sulfate (IRON) 325 (65 Fe) MG TABS Take 1 tablet (325 mg total) by mouth every morning for 30 doses. 30 each 1  . Doxylamine Succinate, Sleep, (SLEEP AID PO) Take 1 tablet by mouth at bedtime as needed (sleep).    Marland Kitchen oxyCODONE-acetaminophen (PERCOCET/ROXICET) 5-325 MG tablet Take 1-2 tablets by mouth every 4 (four) hours as needed for moderate pain. 30 tablet 0   No facility-administered medications prior to visit.      Per HPI unless specifically indicated in ROS section below Review of Systems  Constitutional: Negative for activity change, appetite change, chills, fatigue, fever and unexpected weight change.  HENT: Negative for hearing loss.   Eyes: Negative for visual disturbance.  Respiratory: Negative for cough, chest tightness, shortness of breath and wheezing.   Cardiovascular: Negative for chest pain, palpitations and leg swelling.  Gastrointestinal: Negative  for abdominal distention, abdominal pain, blood in stool, constipation, diarrhea, nausea and vomiting.  Genitourinary: Negative for difficulty urinating and hematuria.  Musculoskeletal: Negative for arthralgias, myalgias and neck pain.  Skin: Negative for rash.  Neurological: Negative for dizziness, seizures, syncope and headaches.  Hematological: Negative for adenopathy. Does not bruise/bleed easily.  Psychiatric/Behavioral: Negative  for dysphoric mood. The patient is not nervous/anxious.    Objective:    BP 136/84 (BP Location: Left Arm, Patient Position: Sitting, Cuff Size: Large)   Pulse 91   Temp 98.4 F (36.9 C) (Oral)   Ht 5' 5.5" (1.664 m)   Wt 213 lb 12 oz (97 kg)   LMP  (Within Months)   SpO2 97%   BMI 35.03 kg/m   Wt Readings from Last 3 Encounters:  05/30/18 213 lb 12 oz (97 kg)  07/22/17 214 lb (97.1 kg)  07/09/17 214 lb 6 oz (97.2 kg)    Physical Exam Vitals signs and nursing note reviewed.  Constitutional:      General: She is not in acute distress.    Appearance: Normal appearance. She is well-developed.  HENT:     Head: Normocephalic and atraumatic.     Right Ear: Hearing, tympanic membrane, ear canal and external ear normal.     Left Ear: Hearing, tympanic membrane, ear canal and external ear normal.     Nose: Nose normal.     Mouth/Throat:     Mouth: Mucous membranes are moist.     Pharynx: Oropharynx is clear. Uvula midline. No oropharyngeal exudate or posterior oropharyngeal erythema.  Eyes:     General: No scleral icterus.    Conjunctiva/sclera: Conjunctivae normal.     Pupils: Pupils are equal, round, and reactive to light.  Neck:     Musculoskeletal: Normal range of motion and neck supple.     Thyroid: Thyromegaly present.  Cardiovascular:     Rate and Rhythm: Normal rate and regular rhythm.     Pulses:          Radial pulses are 2+ on the right side and 2+ on the left side.     Heart sounds: Normal heart sounds. No murmur.  Pulmonary:     Effort: Pulmonary effort is normal. No respiratory distress.     Breath sounds: Normal breath sounds. No wheezing or rales.  Abdominal:     General: Bowel sounds are normal. There is no distension.     Palpations: Abdomen is soft. There is no mass.     Tenderness: There is no abdominal tenderness. There is no guarding or rebound.  Musculoskeletal: Normal range of motion.     Right lower leg: No edema.     Left lower leg: No edema.    Lymphadenopathy:     Cervical: No cervical adenopathy.  Skin:    General: Skin is warm and dry.     Findings: No rash.  Neurological:     Mental Status: She is alert and oriented to person, place, and time.     Comments: CN grossly intact, station and gait intact Neg tinel and phalen No numbness of hands  Psychiatric:        Behavior: Behavior normal.        Thought Content: Thought content normal.        Judgment: Judgment normal.       Lab Results  Component Value Date   TSH 2.07 03/25/2016    Assessment & Plan:   Problem List Items Addressed This Visit  Vitamin D deficiency    Update levels. Currently off replacement.       Relevant Orders   VITAMIN D 25 Hydroxy (Vit-D Deficiency, Fractures)   Severe obesity (BMI 35.0-35.9 with comorbidity) ([place])    Encouraged healthy diet and lifestyle changes to affect sustainable weight loss.       Numbness in both hands    Predominantly L hand.  Negative testing for CTS. Benign exam today.  Check TSH, free T4      Relevant Orders   TSH   Neutropenia (HCC)    Update cbc. H/o lymphocytic leukopenia.       Relevant Orders   Ferritin   CBC with Differential/Platelet   Hx of total hysterectomy    TAH and RSO for fibroids 07/2017      Hepatic steatosis    Update LFTs      Relevant Orders   Comprehensive metabolic panel   Health maintenance examination - Primary    Preventative protocols reviewed and updated unless pt declined. Discussed healthy diet and lifestyle.       Essential hypertension, benign    Chronic, stable. Continue current regimen.       Relevant Medications   amLODipine (NORVASC) 5 MG tablet   Other Relevant Orders   Lipid panel   Comprehensive metabolic panel   TSH   T4, free   Ectatic abdominal aorta (HCC)    Reviewed with patient. Rpt [place] due 2021      Relevant Medications   amLODipine (NORVASC) 5 MG tablet   Dermatofibrosarcoma protuberans of pubic region    Stable period. Denies  new soft tissue mass or pain.        Other Visit Diagnoses    Special screening for malignant neoplasms, colon       Relevant Orders   Ambulatory referral to Gastroenterology       Meds ordered this encounter  Medications  . fluticasone (FLONASE) 50 MCG/ACT nasal spray    Sig: Place 2 sprays into both nostrils daily.    Dispense:  48 g    Refill:  3    Please consider 90 day supplies to promote better adherence  . amLODipine (NORVASC) 5 MG tablet    Sig: Take 1 tablet (5 mg total) by mouth daily.    Dispense:  90 tablet    Refill:  4   Orders Placed This Encounter  Procedures  . Lipid panel  . Comprehensive metabolic panel  . TSH  . T4, free  . VITAMIN D 25 Hydroxy (Vit-D Deficiency, Fractures)  . Ferritin  . CBC with Differential/Platelet  . Ambulatory referral to Gastroenterology    Referral Priority:   Routine    Referral Type:   Consultation    Referral Reason:   Specialty Services Required    Number of Visits Requested:   1    Follow up plan: Return in about 1 year (around 05/31/2019) for annual exam, prior fasting for blood work.  Ria Bush, MD

## 2018-05-30 NOTE — Assessment & Plan Note (Signed)
Chronic, stable. Continue current regimen. 

## 2018-05-30 NOTE — Assessment & Plan Note (Signed)
Update LFT's 

## 2018-05-30 NOTE — Assessment & Plan Note (Signed)
Reviewed with patient. Rpt [place] due 2021

## 2018-05-30 NOTE — Assessment & Plan Note (Signed)
Stable period. Denies new soft tissue mass or pain.

## 2018-06-04 ENCOUNTER — Other Ambulatory Visit: Payer: Self-pay | Admitting: Family Medicine

## 2018-06-04 DIAGNOSIS — D72820 Lymphocytosis (symptomatic): Secondary | ICD-10-CM

## 2018-06-06 ENCOUNTER — Encounter: Payer: Self-pay | Admitting: Family Medicine

## 2018-06-14 ENCOUNTER — Other Ambulatory Visit: Payer: Federal, State, Local not specified - PPO

## 2018-06-16 ENCOUNTER — Encounter: Payer: Self-pay | Admitting: Gastroenterology

## 2018-08-01 ENCOUNTER — Encounter: Payer: Federal, State, Local not specified - PPO | Admitting: Gastroenterology

## 2019-05-29 ENCOUNTER — Telehealth: Payer: Self-pay

## 2019-05-29 NOTE — Telephone Encounter (Signed)
LVM to call clinic, needs COVID screen, front door and back lab info 1.25.2021 TLJ

## 2019-05-31 ENCOUNTER — Other Ambulatory Visit (INDEPENDENT_AMBULATORY_CARE_PROVIDER_SITE_OTHER): Payer: Federal, State, Local not specified - PPO

## 2019-05-31 ENCOUNTER — Other Ambulatory Visit: Payer: Self-pay | Admitting: Family Medicine

## 2019-05-31 DIAGNOSIS — I10 Essential (primary) hypertension: Secondary | ICD-10-CM | POA: Diagnosis not present

## 2019-05-31 DIAGNOSIS — E559 Vitamin D deficiency, unspecified: Secondary | ICD-10-CM

## 2019-05-31 DIAGNOSIS — K760 Fatty (change of) liver, not elsewhere classified: Secondary | ICD-10-CM

## 2019-05-31 DIAGNOSIS — D72820 Lymphocytosis (symptomatic): Secondary | ICD-10-CM

## 2019-05-31 DIAGNOSIS — E785 Hyperlipidemia, unspecified: Secondary | ICD-10-CM | POA: Diagnosis not present

## 2019-05-31 LAB — LIPID PANEL
Cholesterol: 211 mg/dL — ABNORMAL HIGH (ref 0–200)
HDL: 58.1 mg/dL (ref >39.00)
LDL Cholesterol: 117 mg/dL — ABNORMAL HIGH (ref 0–99)
NonHDL: 152.68
Total CHOL/HDL Ratio: 4
Triglycerides: 178 mg/dL — ABNORMAL HIGH (ref 0.0–149.0)
VLDL: 35.6 mg/dL (ref 0.0–40.0)

## 2019-05-31 LAB — COMPREHENSIVE METABOLIC PANEL
ALT: 29 U/L (ref 0–35)
AST: 30 U/L (ref 0–37)
Albumin: 4.4 g/dL (ref 3.5–5.2)
Alkaline Phosphatase: 65 U/L (ref 39–117)
BUN: 16 mg/dL (ref 6–23)
CO2: 31 mEq/L (ref 19–32)
Calcium: 9 mg/dL (ref 8.4–10.5)
Chloride: 98 mEq/L (ref 96–112)
Creatinine, Ser: 1.18 mg/dL (ref 0.40–1.20)
GFR: 58.34 mL/min — ABNORMAL LOW (ref >60.00)
Glucose, Bld: 86 mg/dL (ref 70–99)
Potassium: 3.7 mEq/L (ref 3.5–5.1)
Sodium: 134 mEq/L — ABNORMAL LOW (ref 135–145)
Total Bilirubin: 0.4 mg/dL (ref 0.2–1.2)
Total Protein: 7.4 g/dL (ref 6.0–8.3)

## 2019-05-31 LAB — TSH: TSH: 2.13 u[IU]/mL (ref 0.35–4.50)

## 2019-05-31 LAB — VITAMIN D 25 HYDROXY (VIT D DEFICIENCY, FRACTURES): VITD: 22.96 ng/mL — ABNORMAL LOW (ref 30.00–100.00)

## 2019-05-31 LAB — MICROALBUMIN / CREATININE URINE RATIO
Creatinine,U: 13.3 mg/dL
Microalb Creat Ratio: 5.3 mg/g (ref 0.0–30.0)
Microalb, Ur: <0.7 mg/dL (ref 0.0–1.9)

## 2019-05-31 NOTE — Addendum Note (Signed)
Addended by: Ellamae Sia on: 05/31/2019 08:38 AM   Modules accepted: Orders

## 2019-06-01 LAB — CBC WITH DIFFERENTIAL/PLATELET
Absolute Monocytes: 447 cells/uL (ref 200–950)
Basophils Absolute: 28 cells/uL (ref 0–200)
Basophils Relative: 0.6 %
Eosinophils Absolute: 80 cells/uL (ref 15–500)
Eosinophils Relative: 1.7 %
HCT: 38.6 % (ref 35.0–45.0)
Hemoglobin: 12.9 g/dL (ref 11.7–15.5)
Lymphs Abs: 2646 cells/uL (ref 850–3900)
MCH: 28.6 pg (ref 27.0–33.0)
MCHC: 33.4 g/dL (ref 32.0–36.0)
MCV: 85.6 fL (ref 80.0–100.0)
MPV: 10.1 fL (ref 7.5–12.5)
Monocytes Relative: 9.5 %
Neutro Abs: 1499 cells/uL — ABNORMAL LOW (ref 1500–7800)
Neutrophils Relative %: 31.9 %
Platelets: 241 10*3/uL (ref 140–400)
RBC: 4.51 10*6/uL (ref 3.80–5.10)
RDW: 12.2 % (ref 11.0–15.0)
Total Lymphocyte: 56.3 %
WBC: 4.7 10*3/uL (ref 3.8–10.8)

## 2019-06-01 LAB — PATHOLOGIST SMEAR REVIEW

## 2019-06-02 ENCOUNTER — Encounter: Payer: Self-pay | Admitting: Family Medicine

## 2019-06-02 ENCOUNTER — Telehealth (INDEPENDENT_AMBULATORY_CARE_PROVIDER_SITE_OTHER): Payer: Federal, State, Local not specified - PPO | Admitting: Family Medicine

## 2019-06-02 ENCOUNTER — Other Ambulatory Visit: Payer: Self-pay

## 2019-06-02 VITALS — BP 140/89 | Temp 97.9°F | Ht 65.5 in | Wt 205.0 lb

## 2019-06-02 DIAGNOSIS — I10 Essential (primary) hypertension: Secondary | ICD-10-CM

## 2019-06-02 DIAGNOSIS — Z90710 Acquired absence of both cervix and uterus: Secondary | ICD-10-CM

## 2019-06-02 DIAGNOSIS — C44599 Other specified malignant neoplasm of skin of other part of trunk: Secondary | ICD-10-CM

## 2019-06-02 DIAGNOSIS — D72820 Lymphocytosis (symptomatic): Secondary | ICD-10-CM

## 2019-06-02 DIAGNOSIS — E669 Obesity, unspecified: Secondary | ICD-10-CM

## 2019-06-02 DIAGNOSIS — Z0000 Encounter for general adult medical examination without abnormal findings: Secondary | ICD-10-CM | POA: Diagnosis not present

## 2019-06-02 DIAGNOSIS — N183 Chronic kidney disease, stage 3 unspecified: Secondary | ICD-10-CM | POA: Insufficient documentation

## 2019-06-02 DIAGNOSIS — N289 Disorder of kidney and ureter, unspecified: Secondary | ICD-10-CM

## 2019-06-02 DIAGNOSIS — E559 Vitamin D deficiency, unspecified: Secondary | ICD-10-CM

## 2019-06-02 DIAGNOSIS — E785 Hyperlipidemia, unspecified: Secondary | ICD-10-CM

## 2019-06-02 DIAGNOSIS — I77811 Abdominal aortic ectasia: Secondary | ICD-10-CM

## 2019-06-02 DIAGNOSIS — K760 Fatty (change of) liver, not elsewhere classified: Secondary | ICD-10-CM

## 2019-06-02 DIAGNOSIS — K824 Cholesterolosis of gallbladder: Secondary | ICD-10-CM

## 2019-06-02 MED ORDER — AMLODIPINE BESYLATE 5 MG PO TABS: 5.0000 mg | ORAL_TABLET | Freq: Every day | ORAL | 4 refills | Status: DC

## 2019-06-02 MED ORDER — FLUTICASONE PROPIONATE 50 MCG/ACT NA SUSP: 2.0000 | Freq: Every day | NASAL | 3 refills | Status: DC

## 2019-06-02 MED ORDER — LEVOCETIRIZINE DIHYDROCHLORIDE 5 MG PO TABS: 5.0000 mg | ORAL_TABLET | Freq: Every evening | ORAL | 3 refills | Status: DC

## 2019-06-02 MED ORDER — IBUPROFEN 800 MG PO TABS: 800.0000 mg | ORAL_TABLET | Freq: Three times a day (TID) | ORAL | 1 refills | Status: DC | PRN

## 2019-06-02 MED ORDER — VITAMIN D3 1.25 MG (50000 UT) PO TABS: 1.0000 | ORAL_TABLET | ORAL | 1 refills | Status: DC

## 2019-06-02 NOTE — Assessment & Plan Note (Signed)
Reassess at abd [place] later this year.

## 2019-06-02 NOTE — Assessment & Plan Note (Signed)
CBC stable. Path review stable. Continue to monitor.

## 2019-06-02 NOTE — Assessment & Plan Note (Signed)
Preventative protocols reviewed and updated unless pt declined. Discussed healthy diet and lifestyle.  

## 2019-06-02 NOTE — Assessment & Plan Note (Signed)
Mild, noted today. Work towards tighter BP control. Avoids NSAIDs, stays well hydrated with plenty of water throughout the day.

## 2019-06-02 NOTE — Assessment & Plan Note (Signed)
LFTs normal

## 2019-06-02 NOTE — Assessment & Plan Note (Signed)
Stable period without new soft tissue mass or pain.

## 2019-06-02 NOTE — Assessment & Plan Note (Signed)
Encouraged ongoing efforts at sustainable weight loss.

## 2019-06-02 NOTE — Assessment & Plan Note (Signed)
Discussed updating abd [place] in the fall

## 2019-06-02 NOTE — Progress Notes (Signed)
Virtual visit completed through [place]. Due to national recommendations of social distancing due to COVID-19, a virtual visit is felt to be most appropriate for this patient at this time. Reviewed limitations of a virtual visit.   Patient location: home Provider location: [place] at [facility], office If any vitals were documented, they were collected by patient at home unless specified below.    BP 140/89   Temp 97.9 F (36.6 C)   Ht 5' 5.5" (1.664 m)   Wt 205 lb (93 kg)   BMI 33.59 kg/m     CC: CPE Subjective:    Patient ID: [name], female    DOB: [birth year], 54 y.o.   MRN: [id number]  HPI: [name] is a 54 y.o. female presenting on 06/02/2019 for Annual Exam   BP slightly elevated. No HTN symptoms. Monitors salt in diet. Not going to gym due to styaingat home.   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 Denies new soft tissue mass or skin changes or pain in area.   Gallbladder polyp noted on abd [place] 01/2015 (16mm). Also noted ectatic abd aorta - rec rpt [place] 5 yrs.   Preventative: Colon cancer screening - discussed options, wants colonoscopy - postponed due to pandemic. Wants to go summer 2021. No blood in stool or BM changes.  [facility] -[facility](normal pap per patient),h/o endometrial ablation. S/p TAH and RSO 07/2017 for fibroids. L ovary remains. Hot flashes are better.  Mammogram- 07/2017 normal (at [facility] office). Gets this done yearly through OBGYN - scheduled next month.  LMP - regular periods until hysterectomy 07/2017. Occasional hot flashes.  Flu - declines Tdap - declines Seat belt use discussed Sunscreen use discussed. No changing moles on skin. Saw dermatologist.  Non smoker Alcohol - rare Dentist q60mo  Eye exam yearly   Caffeine: 1 cup coffee/day Lives with husband,  3 sons Occupation: works for [employer], started going back to school Activity: going to gym 3x/wk- cardio Diet: good water, fruits and vegetables daily     Relevant past medical, surgical, family and social history reviewed and updated as indicated. Interim medical history since our last visit reviewed. Allergies and medications reviewed and updated. Outpatient Medications Prior to Visit  Medication Sig Dispense Refill  . amLODipine (NORVASC) 5 MG tablet Take 1 tablet (5 mg total) by mouth daily. 90 tablet 4  . Cholecalciferol (VITAMIN D3) 50000 units TABS Take 1 tablet by mouth once a week. (Patient taking differently: Take 1 tablet by mouth every Sunday. ) 12 tablet 1  . fluticasone (FLONASE) 50 MCG/ACT nasal spray Place 2 sprays into both nostrils daily. 48 g 3  . ibuprofen (ADVIL,MOTRIN) 800 MG tablet Take 1 tablet (800 mg total) by mouth every 8 (eight) hours as needed (mild pain). 30 tablet 0  . levocetirizine (XYZAL) 5 MG tablet Take 1 tablet (5 mg total) by mouth every evening. 30 tablet 11  . Ferrous Sulfate (IRON) 325 (65 Fe) MG TABS Take 1 tablet (325 mg total) by mouth every morning for 30 doses. 30 each 1   No facility-administered medications prior to visit.     Per HPI unless specifically indicated in ROS section below Review of Systems  Constitutional: Negative for activity change, appetite change, chills, fatigue, fever and unexpected weight change.  HENT: Negative for hearing loss.   Eyes: Negative for visual  disturbance.  Respiratory: Negative for cough, chest tightness, shortness of breath and wheezing.   Cardiovascular: Negative for chest pain, palpitations and leg swelling.  Gastrointestinal: Negative for abdominal distention, abdominal pain, blood in stool, constipation, diarrhea, nausea and vomiting.  Genitourinary: Negative for difficulty urinating and hematuria.  Musculoskeletal: Negative for arthralgias, myalgias and neck pain.  Skin: Negative for rash.    Neurological: Negative for dizziness, seizures, syncope and headaches.  Hematological: Negative for adenopathy. Does not bruise/bleed easily.  Psychiatric/Behavioral: Negative for dysphoric mood. The patient is not nervous/anxious.    Objective:    BP 140/89   Temp 97.9 F (36.6 C)   Ht 5' 5.5" (1.664 m)   Wt 205 lb (93 kg)   BMI 33.59 kg/m   Wt Readings from Last 3 Encounters:  06/02/19 205 lb (93 kg)  05/30/18 213 lb 12 oz (97 kg)  07/22/17 214 lb (97.1 kg)     Physical exam: Gen: alert, NAD, not ill appearing Pulm: speaks in complete sentences without increased work of breathing Psych: normal mood, normal thought content      Results for orders placed or performed in visit on 05/31/19  CBC with Differential/Platelet  Result Value Ref Range   WBC 4.7 3.8 - 10.8 Thousand/uL   RBC 4.51 3.80 - 5.10 Million/uL   Hemoglobin 12.9 11.7 - 15.5 g/dL   HCT 38.6 35.0 - 45.0 %   MCV 85.6 80.0 - 100.0 fL   MCH 28.6 27.0 - 33.0 pg   MCHC 33.4 32.0 - 36.0 g/dL   RDW 12.2 11.0 - 15.0 %   Platelets 241 140 - 400 Thousand/uL   MPV 10.1 7.5 - 12.5 fL   Neutro Abs 1,499 (L) 1,500 - 7,800 cells/uL   Lymphs Abs 2,646 850 - 3,900 cells/uL   Absolute Monocytes 447 200 - 950 cells/uL   Eosinophils Absolute 80 15 - 500 cells/uL   Basophils Absolute 28 0 - 200 cells/uL   Neutrophils Relative % 31.9 %   Total Lymphocyte 56.3 %   Monocytes Relative 9.5 %   Eosinophils Relative 1.7 %   Basophils Relative 0.6 %  Microalbumin / creatinine urine ratio  Result Value Ref Range   Microalb, Ur <0.7 0.0 - 1.9 mg/dL   Creatinine,U 13.3 mg/dL   Microalb Creat Ratio 5.3 0.0 - 30.0 mg/g  TSH  Result Value Ref Range   TSH 2.13 0.35 - 4.50 uIU/mL  Comprehensive metabolic panel  Result Value Ref Range   Sodium 134 (L) 135 - 145 mEq/L   Potassium 3.7 3.5 - 5.1 mEq/L   Chloride 98 96 - 112 mEq/L   CO2 31 19 - 32 mEq/L   Glucose, Bld 86 70 - 99 mg/dL   BUN 16 6 - 23 mg/dL   Creatinine, Ser 1.18  0.40 - 1.20 mg/dL   Total Bilirubin 0.4 0.2 - 1.2 mg/dL   Alkaline Phosphatase 65 39 - 117 U/L   AST 30 0 - 37 U/L   ALT 29 0 - 35 U/L   Total Protein 7.4 6.0 - 8.3 g/dL   Albumin 4.4 3.5 - 5.2 g/dL   GFR 58.34 (L) >60.00 mL/min   Calcium 9.0 8.4 - 10.5 mg/dL  Lipid panel  Result Value Ref Range   Cholesterol 211 (H) 0 - 200 mg/dL   Triglycerides 178.0 (H) 0.0 - 149.0 mg/dL   HDL 58.10 >39.00 mg/dL   VLDL 35.6 0.0 - 40.0 mg/dL   LDL Cholesterol 117 (H) 0 - 99 mg/dL  Total CHOL/HDL Ratio 4    NonHDL 152.68   VITAMIN D 25 Hydroxy (Vit-D Deficiency, Fractures)  Result Value Ref Range   VITD 22.96 (L) 30.00 - 100.00 ng/mL  Pathologist smear review  Result Value Ref Range   Path Review     Lab Results  Component Value Date   VITAMINB12 456 03/23/2017    Assessment & Plan:   Problem List Items Addressed This Visit    Vitamin D deficiency    Restart weekly Rx replacement x 6 mo then change to OTC daily replacement.       Renal insufficiency    Mild, noted today. Work towards tighter BP control. Avoids NSAIDs, stays well hydrated with plenty of water throughout the day.       Relative lymphocytosis    CBC stable. Path review stable. Continue to monitor.       Obesity, Class I, BMI 30.0-34.9 (see actual BMI)    Encouraged ongoing efforts at sustainable weight loss.       Hx of total hysterectomy   Hepatic steatosis    LFTs normal.       Health maintenance examination - Primary    Preventative protocols reviewed and updated unless pt declined. Discussed healthy diet and lifestyle.       Gallbladder polyp    Reassess at abd [place] later this year.       Essential hypertension, benign    Chronic, mildly elevated today - she will start monitoring more closely and let me know if remaining high.       Relevant Medications   amLODipine (NORVASC) 5 MG tablet   Ectatic abdominal aorta (HCC)    Discussed updating abd [place] in the fall       Relevant Medications    amLODipine (NORVASC) 5 MG tablet   Dyslipidemia    Chronic, deteriorated. Encouraged low chol diet, rec increase legumes and fiber to help LDL.  The 10-year ASCVD risk score Mikey Bussing DC Brooke Bonito., et al., 2013) is: 5.2%   Values used to calculate the score:     Age: 31 years     Sex: Female     Is Non-Hispanic African American: Yes     Diabetic: No     Tobacco smoker: No     Systolic Blood Pressure: [id number] mmHg     Is BP treated: Yes     HDL Cholesterol: 58.1 mg/dL     Total Cholesterol: 211 mg/dL       Dermatofibrosarcoma protuberans of pubic region    Stable period without new soft tissue mass or pain.           Meds ordered this encounter  Medications  . amLODipine (NORVASC) 5 MG tablet    Sig: Take 1 tablet (5 mg total) by mouth daily.    Dispense:  90 tablet    Refill:  4  . ibuprofen (ADVIL) 800 MG tablet    Sig: Take 1 tablet (800 mg total) by mouth every 8 (eight) hours as needed (mild pain).    Dispense:  30 tablet    Refill:  1  . fluticasone (FLONASE) 50 MCG/ACT nasal spray    Sig: Place 2 sprays into both nostrils daily.    Dispense:  48 g    Refill:  3  . levocetirizine (XYZAL) 5 MG tablet    Sig: Take 1 tablet (5 mg total) by mouth every evening.    Dispense:  90 tablet    Refill:  3  .  Cholecalciferol (VITAMIN D3) 1.25 MG (50000 UT) TABS    Sig: Take 1 tablet by mouth once a week.    Dispense:  12 tablet    Refill:  1   No orders of the defined types were placed in this encounter.   I discussed the assessment and treatment plan with the patient. The patient was provided an opportunity to ask questions and all were answered. The patient agreed with the plan and demonstrated an understanding of the instructions. The patient was advised to call back or seek an in-person evaluation if the symptoms worsen or if the condition fails to improve as anticipated.  Follow up plan: Return in about 1 year (around 06/01/2020) for annual exam, prior fasting for blood  work.  Ria Bush, MD

## 2019-06-02 NOTE — Assessment & Plan Note (Signed)
Chronic, deteriorated. Encouraged low chol diet, rec increase legumes and fiber to help LDL.  The 10-year ASCVD risk score [name] Brooke Bonito., et al., 2013) is: 5.2%   Values used to calculate the score:     Age: 54 years     Sex: Female     Is Non-Hispanic African American: Yes     Diabetic: No     Tobacco smoker: No     Systolic Blood Pressure: [id number] mmHg     Is BP treated: Yes     HDL Cholesterol: 58.1 mg/dL     Total Cholesterol: 211 mg/dL

## 2019-06-02 NOTE — Assessment & Plan Note (Signed)
Restart weekly Rx replacement x 6 mo then change to OTC daily replacement.

## 2019-06-02 NOTE — Assessment & Plan Note (Signed)
Chronic, mildly elevated today - she will start monitoring more closely and let me know if remaining high.

## 2019-06-05 DIAGNOSIS — U071 COVID-19: Secondary | ICD-10-CM

## 2019-06-05 HISTORY — DX: COVID-19: U07.1

## 2019-06-05 NOTE — Telephone Encounter (Signed)
Form signed and faxed

## 2019-06-05 NOTE — Telephone Encounter (Signed)
Form printed, fill out and placed in Dr. Darnell Level box for signature.

## 2019-06-12 ENCOUNTER — Ambulatory Visit: Payer: Federal, State, Local not specified - PPO | Attending: Internal Medicine

## 2019-06-12 DIAGNOSIS — Z20822 Contact with and (suspected) exposure to covid-19: Secondary | ICD-10-CM | POA: Diagnosis not present

## 2019-06-14 ENCOUNTER — Telehealth: Payer: Self-pay

## 2019-06-14 LAB — NOVEL CORONAVIRUS, NAA: SARS-CoV-2, NAA: DETECTED — AB

## 2019-06-14 NOTE — Telephone Encounter (Signed)
Patient wanted to know if it is ok to take Mucinex for her cough being that she is positive with COVID. Please call Ph# [phone]

## 2019-06-14 NOTE — Telephone Encounter (Signed)
Pt returned call regarding her covid test results. Will have nurse call her back .  [place]

## 2019-06-14 NOTE — Telephone Encounter (Signed)
Attempted to contact pt; left message on voicemail. 

## 2019-06-14 NOTE — Telephone Encounter (Signed)
Lvm asking pt to call back. Need to relay Dr. G's message and get answers to his questions.  

## 2019-06-14 NOTE — Telephone Encounter (Signed)
Ok to take plain mucinex for cough.  Positive covid test 06/12/2019. plz get update on covid positive patient - when did symptoms start, what symptoms is she currently having? Any fever? Is she out of work?

## 2019-06-15 ENCOUNTER — Encounter: Payer: Self-pay | Admitting: Family Medicine

## 2019-06-15 NOTE — Telephone Encounter (Signed)
noted 

## 2019-06-15 NOTE — Telephone Encounter (Signed)
Spoke with pt relaying Dr. Synthia Innocent message and asking about sxs.  Says they started 06/13/19 with HA, diarrhea and cough.  States her sxs are gone except minor cough, which is improving.  However, she will start plain Mucinex.  Says she is working from home.  FYI to Dr. Darnell Level.

## 2019-06-20 NOTE — Telephone Encounter (Signed)
Please call for update on Covid symptoms  If fully resolved, ok to stop following.  However would recommend she complete full 10 day quarantine from 06/13/2019 - would end on 06/24/2019 assuming no fever and symptoms improved.

## 2019-06-20 NOTE — Telephone Encounter (Signed)
Noted. Thanks.

## 2019-06-20 NOTE — Telephone Encounter (Signed)
Spoke with pt for update on sxs.  Says she is feeling a lot better.  All sxs have resolved except the cough.  However, even it is improving.  Says she feels good.  I relayed Dr. Synthia Innocent message.  Pt verbalizes understanding.

## 2019-06-26 ENCOUNTER — Other Ambulatory Visit: Payer: Federal, State, Local not specified - PPO

## 2019-09-11 ENCOUNTER — Encounter: Payer: Self-pay | Admitting: Gastroenterology

## 2019-09-11 DIAGNOSIS — Z01419 Encounter for gynecological examination (general) (routine) without abnormal findings: Secondary | ICD-10-CM | POA: Diagnosis not present

## 2019-09-11 DIAGNOSIS — Z1231 Encounter for screening mammogram for malignant neoplasm of breast: Secondary | ICD-10-CM | POA: Diagnosis not present

## 2019-09-12 ENCOUNTER — Other Ambulatory Visit: Payer: Self-pay | Admitting: Obstetrics and Gynecology

## 2019-09-12 DIAGNOSIS — R928 Other abnormal and inconclusive findings on diagnostic imaging of breast: Secondary | ICD-10-CM

## 2019-09-20 ENCOUNTER — Other Ambulatory Visit: Payer: Self-pay

## 2019-09-20 ENCOUNTER — Ambulatory Visit (AMBULATORY_SURGERY_CENTER): Payer: Self-pay | Admitting: *Deleted

## 2019-09-20 VITALS — Temp 96.8°F | Ht 65.5 in | Wt 217.0 lb

## 2019-09-20 DIAGNOSIS — Z1211 Encounter for screening for malignant neoplasm of colon: Secondary | ICD-10-CM

## 2019-09-20 MED ORDER — NA SULFATE-K SULFATE-MG SULF 17.5-3.13-1.6 GM/177ML PO SOLN: ORAL | 0 refills | Status: DC

## 2019-09-20 NOTE — Progress Notes (Signed)
Patient is here in-person for PV. Patient denies any allergies to eggs or soy. Patient denies any problems with anesthesia/sedation. Patient denies any oxygen use at home. Patient denies taking any diet/weight loss medications or blood thinners. Patient is not being treated for MRSA or C-diff. Patient is aware of our care-partner policy and 0000000 safety protocol. EMMI education assisgned to the patient for the procedure, this was explained and instructions given to patient.    Prep Prescription coupon given to the patient.

## 2019-09-21 ENCOUNTER — Ambulatory Visit
Admission: RE | Admit: 2019-09-21 | Discharge: 2019-09-21 | Disposition: A | Discharge status: Home / Self Care | Payer: Federal, State, Local not specified - PPO | Source: Ambulatory Visit | Attending: Obstetrics and Gynecology | Admitting: Obstetrics and Gynecology

## 2019-09-21 DIAGNOSIS — R922 Inconclusive mammogram: Secondary | ICD-10-CM | POA: Diagnosis not present

## 2019-09-21 DIAGNOSIS — R928 Other abnormal and inconclusive findings on diagnostic imaging of breast: Secondary | ICD-10-CM

## 2019-09-21 DIAGNOSIS — N6489 Other specified disorders of breast: Secondary | ICD-10-CM | POA: Diagnosis not present

## 2019-10-03 HISTORY — PX: COLONOSCOPY: SHX174

## 2019-10-13 ENCOUNTER — Other Ambulatory Visit: Payer: Self-pay

## 2019-10-13 ENCOUNTER — Ambulatory Visit (AMBULATORY_SURGERY_CENTER): Payer: Federal, State, Local not specified - PPO | Admitting: Gastroenterology

## 2019-10-13 ENCOUNTER — Encounter: Payer: Self-pay | Admitting: Gastroenterology

## 2019-10-13 VITALS — BP 138/66 | HR 65 | Temp 96.9°F | Resp 21 | Ht 65.5 in | Wt 217.0 lb

## 2019-10-13 DIAGNOSIS — D129 Benign neoplasm of anus and anal canal: Secondary | ICD-10-CM

## 2019-10-13 DIAGNOSIS — K621 Rectal polyp: Secondary | ICD-10-CM | POA: Diagnosis not present

## 2019-10-13 DIAGNOSIS — Z1211 Encounter for screening for malignant neoplasm of colon: Secondary | ICD-10-CM | POA: Diagnosis not present

## 2019-10-13 DIAGNOSIS — D128 Benign neoplasm of rectum: Secondary | ICD-10-CM

## 2019-10-13 DIAGNOSIS — K635 Polyp of colon: Secondary | ICD-10-CM

## 2019-10-13 HISTORY — PX: COLONOSCOPY WITH PROPOFOL: SHX5780

## 2019-10-13 MED ORDER — SODIUM CHLORIDE 0.9 % IV SOLN: 500.0000 mL | Freq: Once | INTRAVENOUS | Status: DC

## 2019-10-13 NOTE — Progress Notes (Signed)
Pt's states no medical or surgical changes since previsit or office visit.  ° °VS DT °

## 2019-10-13 NOTE — Patient Instructions (Signed)
Read all of the handouts igen to you by your recovery room nurse.  Thank-you for choosing [place] for your healthcare needs today.  YOU HAD AN ENDOSCOPIC PROCEDURE TODAY AT [facility]:   Refer to the procedure report that was given to you for any specific questions about what was found during the examination.  If the procedure report does not answer your questions, please call your gastroenterologist to clarify.  If you requested that your care partner not be given the details of your procedure findings, then the procedure report has been included in a sealed envelope for you to review at your convenience later.  YOU SHOULD EXPECT: Some feelings of bloating in the abdomen. Passage of more gas than usual.  Walking can help get rid of the air that was put into your GI tract during the procedure and reduce the bloating. If you had a lower endoscopy (such as a colonoscopy or flexible sigmoidoscopy) you may notice spotting of blood in your stool or on the toilet paper. If you underwent a bowel prep for your procedure, you may not have a normal bowel movement for a few days.  Please Note:  You might notice some irritation and congestion in your nose or some drainage.  This is from the oxygen used during your procedure.  There is no need for concern and it should clear up in a day or so.  SYMPTOMS TO REPORT IMMEDIATELY:   Following lower endoscopy (colonoscopy or flexible sigmoidoscopy):  Excessive amounts of blood in the stool  Significant tenderness or worsening of abdominal pains  Swelling of the abdomen that is new, acute  Fever of 100F or higher   For urgent or emergent issues, a gastroenterologist can be reached at any hour by calling [phone]. Do not use MyChart messaging for urgent concerns.    DIET:  We do recommend a small meal at first, but then you may proceed to your regular diet.  Drink plenty of fluids but you should avoid alcoholic beverages for 24 hours.  Try top  increase the fiber in your diet, and drink plenty of water.  Reduce your intake of red meat and alcohol.  ACTIVITY:  You should plan to take it easy for the rest of today and you should NOT DRIVE or use heavy machinery until tomorrow (because of the sedation medicines used during the test).    FOLLOW UP: Our staff will call the number listed on your records 48-72 hours following your procedure to check on you and address any questions or concerns that you may have regarding the information given to you following your procedure. If we do not reach you, we will leave a message.  We will attempt to reach you two times.  During this call, we will ask if you have developed any symptoms of COVID 19. If you develop any symptoms (ie: fever, flu-like symptoms, shortness of breath, cough etc.) before then, please call [phone].  If you test positive for Covid 19 in the 2 weeks post procedure, please call and report this information to [place].    If any biopsies were taken you will be contacted by phone or by letter within the next 1-3 weeks.  Please call us at [phone] if you have not heard about the biopsies in 3 weeks.    SIGNATURES/CONFIDENTIALITY: You and/or your care partner have signed paperwork which will be entered into your electronic medical record.  These signatures attest to the fact that that the  information above on your After Visit Summary has been reviewed and is understood.  Full responsibility of the confidentiality of this discharge information lies with you and/or your care-partner.

## 2019-10-13 NOTE — Progress Notes (Signed)
To PACU, VSS. Report to [email or web address] 

## 2019-10-13 NOTE — Progress Notes (Signed)
Called to room to assist during endoscopic procedure.  Patient ID and intended procedure confirmed with present staff. Received instructions for my participation in the procedure from the performing physician.  

## 2019-10-13 NOTE — Op Note (Signed)
[place] Patient Name: [name] Procedure Date: 10/13/2019 10:20 AM MRN: [id number] Endoscopist: Thornton Park MD, MD Age: 54 Referring MD:  Date of Birth: [birth year] Gender: Female Account #: [id number] Procedure:                Colonoscopy Indications:              Screening for colorectal malignant neoplasm, This                            is the patient's first colonoscopy                           No known family history of colon cancer or polyps Medicines:                Monitored Anesthesia Care Procedure:                Pre-Anesthesia Assessment:                           - Prior to the procedure, a History and Physical                            was performed, and patient medications and                            allergies were reviewed. The patient's tolerance of                            previous anesthesia was also reviewed. The risks                            and benefits of the procedure and the sedation                            options and risks were discussed with the patient.                            All questions were answered, and informed consent                            was obtained. Prior Anticoagulants: The patient has                            taken no previous anticoagulant or antiplatelet                            agents. ASA Grade Assessment: II - A patient with                            mild systemic disease. After reviewing the risks                            and benefits, the patient was deemed in  satisfactory condition to undergo the procedure.                           After obtaining informed consent, the colonoscope                            was passed under direct vision. Throughout the                            procedure, the patient's blood pressure, pulse, and                            oxygen saturations were monitored continuously. The                            Colonoscope was  introduced through the anus and                            advanced to the the cecum, identified by                            appendiceal orifice and ileocecal valve. A second                            forward view of the right colon was performed. The                            colonoscopy was performed without difficulty. The                            patient tolerated the procedure well. The quality                            of the bowel preparation was good. The ileocecal                            valve, appendiceal orifice, and rectum were                            photographed. Scope In: 10:30:28 AM Scope Out: 10:48:54 AM Scope Withdrawal Time: 0 hours 14 minutes 55 seconds  Total Procedure Duration: 0 hours 18 minutes 26 seconds  Findings:                 The perianal and digital rectal examinations were                            normal.                           A 3 mm polyp was found in the rectum. The polyp was                            sessile. The polyp was removed with a cold snare.  Resection and retrieval were complete. Estimated                            blood loss was minimal.                           A less than 1 mm polyp was found in the descending                            colon. The polyp was flat. The polyp was removed                            with a cold biopsy forceps. Resection and retrieval                            were complete. Estimated blood loss: none.                           The entire examined colon appeared normal on direct                            and retroflexion views. Complications:            No immediate complications. Estimated blood loss:                            Minimal. Estimated Blood Loss:     Estimated blood loss was minimal. Impression:               - One 3 mm polyp in the rectum, removed with a cold                            snare. Resected and retrieved.                           - One  less than 1 mm polyp in the descending colon,                            removed with a cold biopsy forceps. Resected and                            retrieved.                           - The entire examined colon is normal on direct and                            retroflexion views. Recommendation:           - Patient has a contact number available for                            emergencies. The signs and symptoms of potential  delayed complications were discussed with the                            patient. Return to normal activities tomorrow.                            Written discharge instructions were provided to the                            patient.                           - Resume previous diet.                           - Continue present medications.                           - Await pathology results.                           - Repeat colonoscopy date to be determined after                            pending pathology results are reviewed for                            surveillance.                           - Emerging evidence supports eating a diet of                            fruits, vegetables, grains, calcium, and yogurt                            while reducing red meat and alcohol may reduce the                            risk of colon cancer.                           - Thank you for allowing me to be involved in your                            colon cancer prevention. Thornton Park MD, MD 10/13/2019 10:56:26 AM This report has been signed electronically.

## 2019-10-17 ENCOUNTER — Telehealth: Payer: Self-pay

## 2019-10-17 NOTE — Telephone Encounter (Signed)
°  Follow up Call-  Call back number 10/13/2019  Post procedure Call Back phone  # [phone]  Permission to leave phone message Yes  Some recent data might be hidden     Patient questions:  Do you have a fever, pain , or abdominal swelling? No. Pain Score  0 *  Have you tolerated food without any problems? Yes.    Have you been able to return to your normal activities? Yes.    Do you have any questions about your discharge instructions: Diet   No. Medications  No. Follow up visit  No.  Do you have questions or concerns about your Care? No.  Actions: * If pain score is 4 or above: No action needed, pain <4.  1. Have you developed a fever since your procedure? no  2.   Have you had an respiratory symptoms (SOB or cough) since your procedure? no  3.   Have you tested positive for COVID 19 since your procedure no  4.   Have you had any family members/close contacts diagnosed with the COVID 19 since your procedure?  no   If yes to any of these questions please route to Joylene John, RN and Erenest Rasher, RN

## 2019-10-17 NOTE — Telephone Encounter (Signed)
Left message on follow up call. 

## 2019-10-20 ENCOUNTER — Encounter: Payer: Self-pay | Admitting: Gastroenterology

## 2020-09-16 ENCOUNTER — Telehealth: Payer: Self-pay | Admitting: Family Medicine

## 2020-09-16 MED ORDER — AMLODIPINE BESYLATE 5 MG PO TABS: 5.0000 mg | ORAL_TABLET | Freq: Every day | ORAL | 1 refills | Status: DC

## 2020-09-16 NOTE — Telephone Encounter (Signed)
  LAST APPOINTMENT DATE: Visit date not found   NEXT APPOINTMENT DATE:@8 /17/2022  MEDICATION: Amlodipine  PHARMACY: [facility]  Let patient know to contact pharmacy at the end of the day to make sure medication is ready.  Please notify patient to allow 48-72 hours to process  Encourage patient to contact the pharmacy for refills or they can request refills through [facility]:   LAST REFILL:  QTY:  REFILL DATE:    OTHER COMMENTS:    Okay for refill?  Please advise

## 2020-09-16 NOTE — Telephone Encounter (Signed)
E-scribed refill 

## 2020-10-12 IMAGING — MG MM DIGITAL DIAGNOSTIC UNILAT*L* W/ TOMO W/ CAD
4 series · 4 of 12 positions shown · non-contrast
Comparison: Previous exam(s).

CLINICAL DATA: The patient was called back for left breast focal
asymmetry.

EXAM:
DIGITAL DIAGNOSTIC LEFT MAMMOGRAM WITH TOMO
ULTRASOUND LEFT BREAST

[L CC synth-2D]
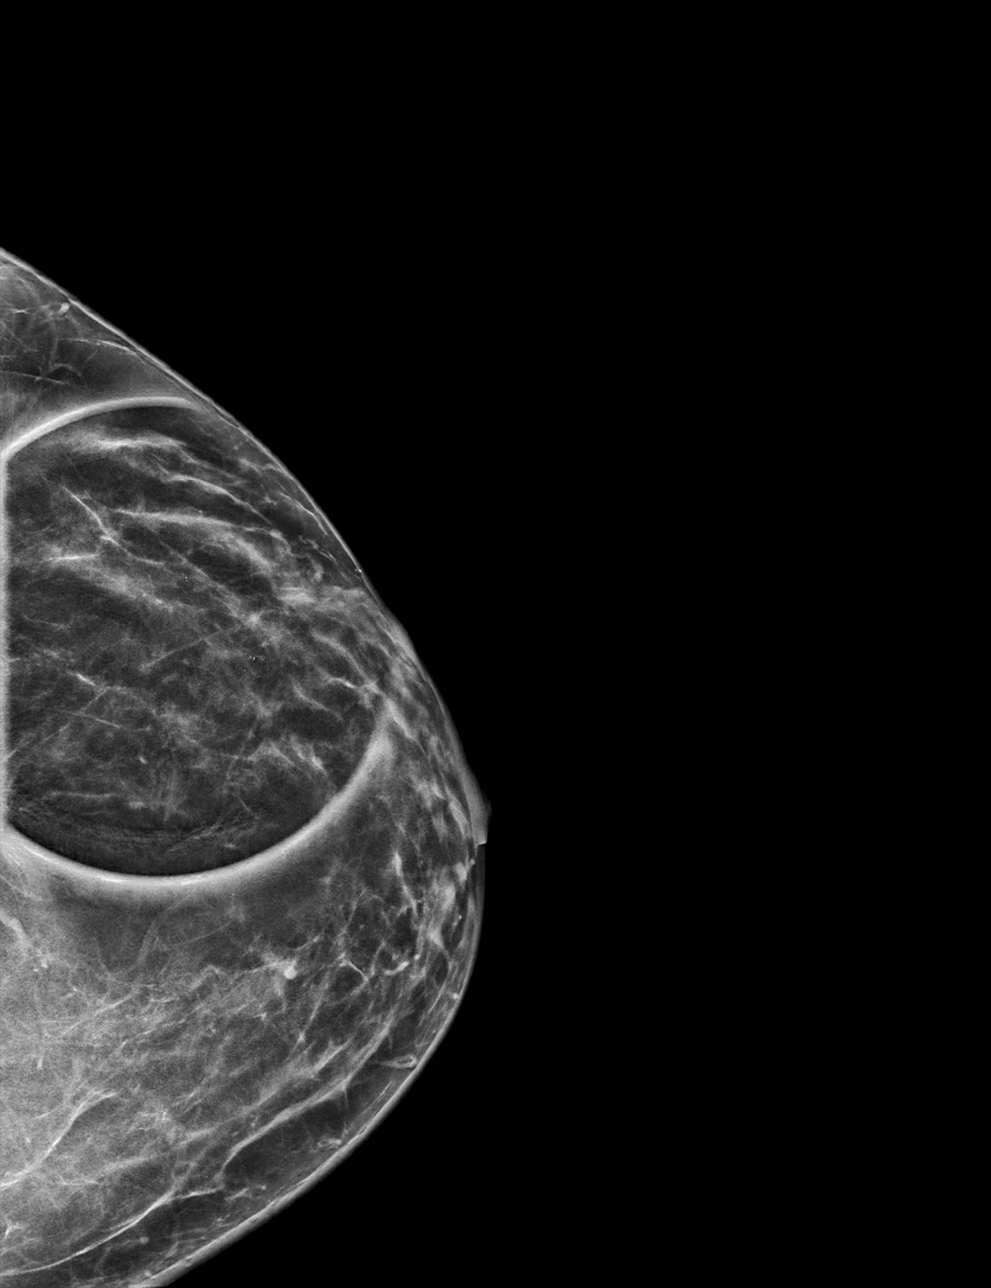

[L MLO synth-2D]
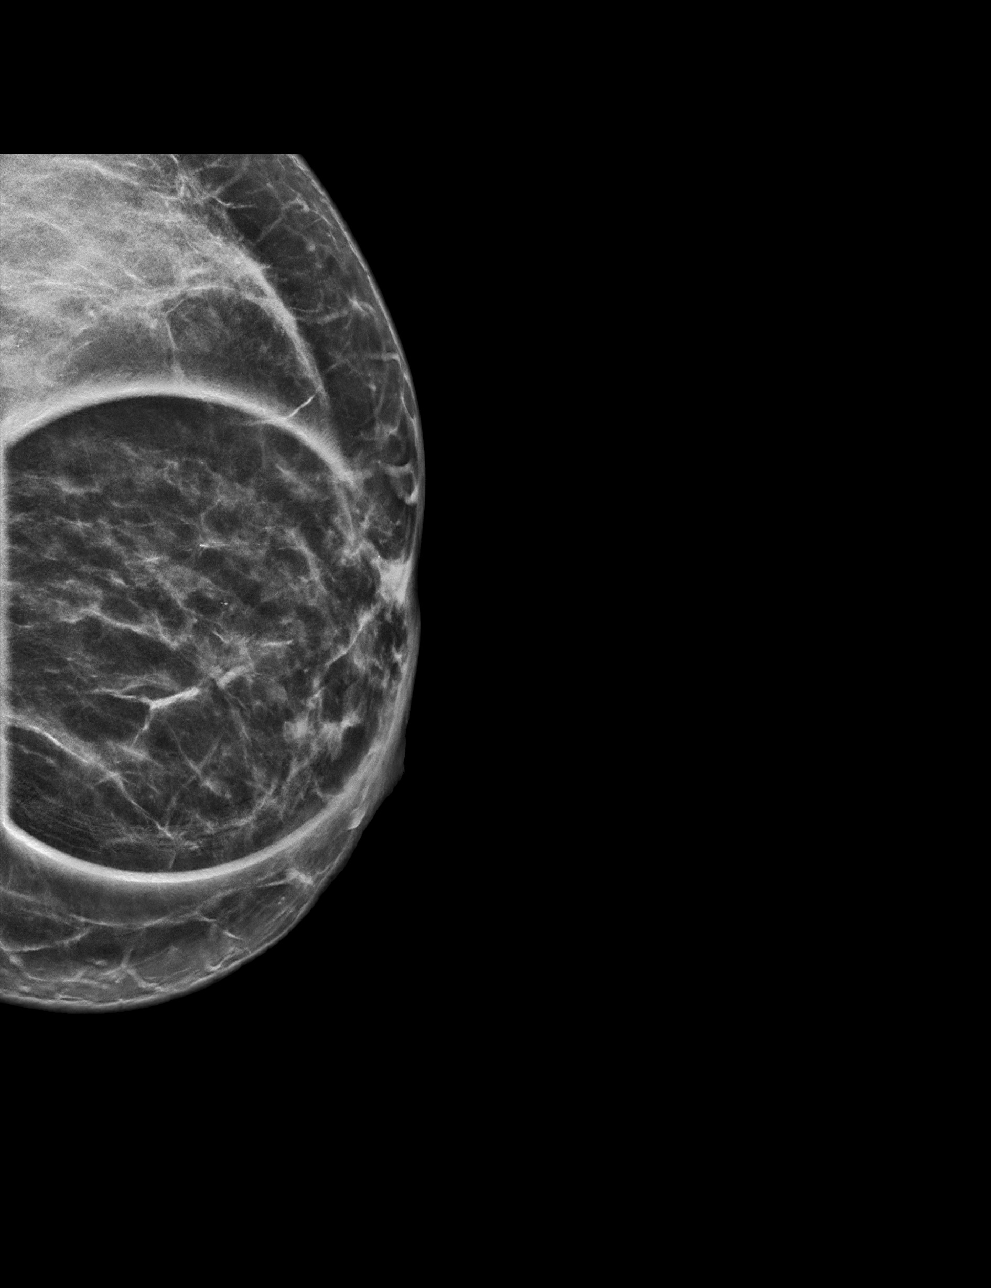

[L MLO tomo · tomo slice 31/62.0]
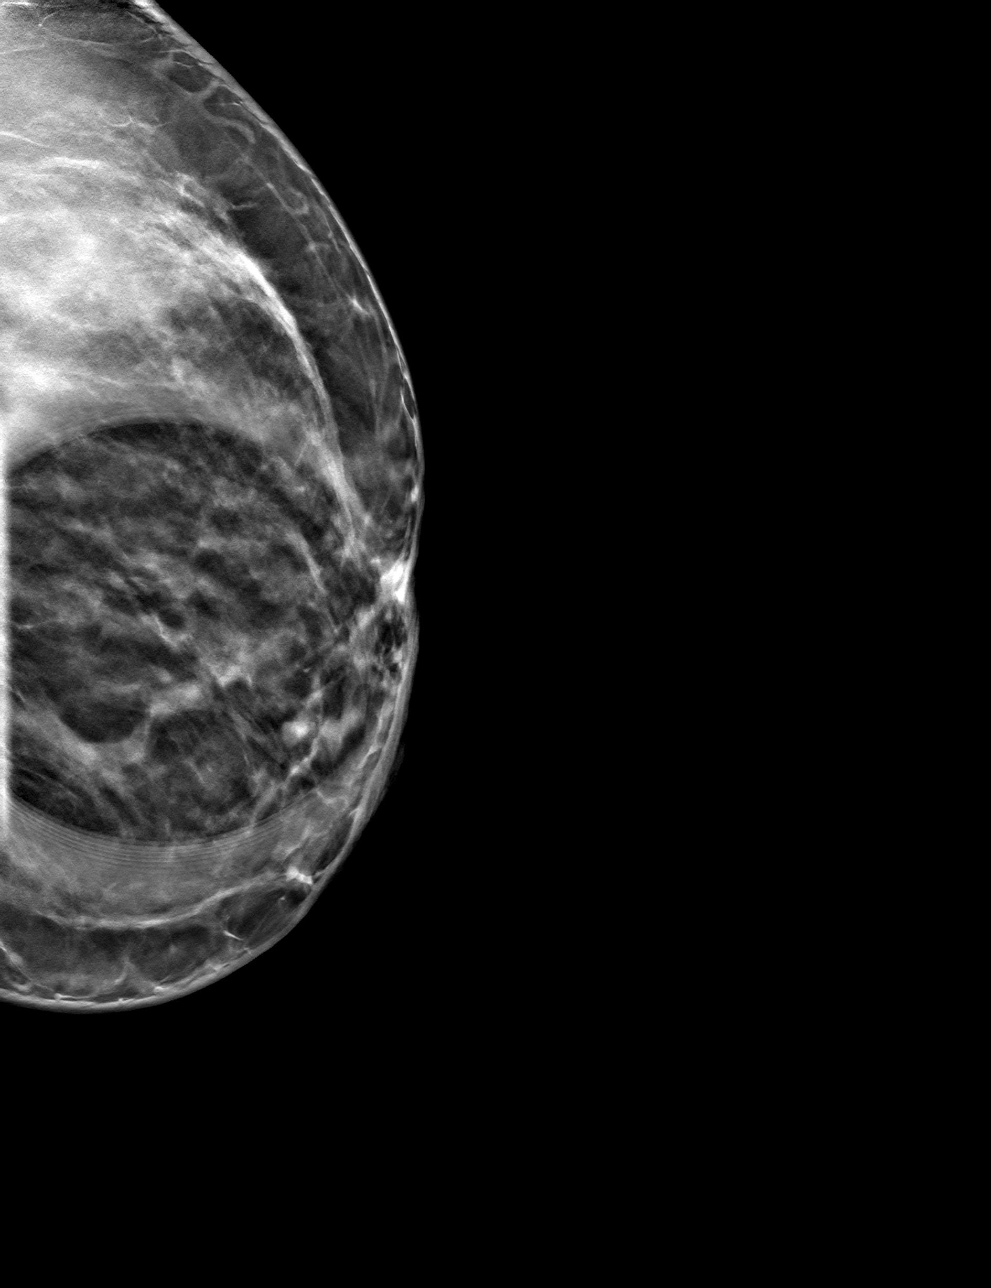

[L CC tomo · tomo slice 27/54.0]
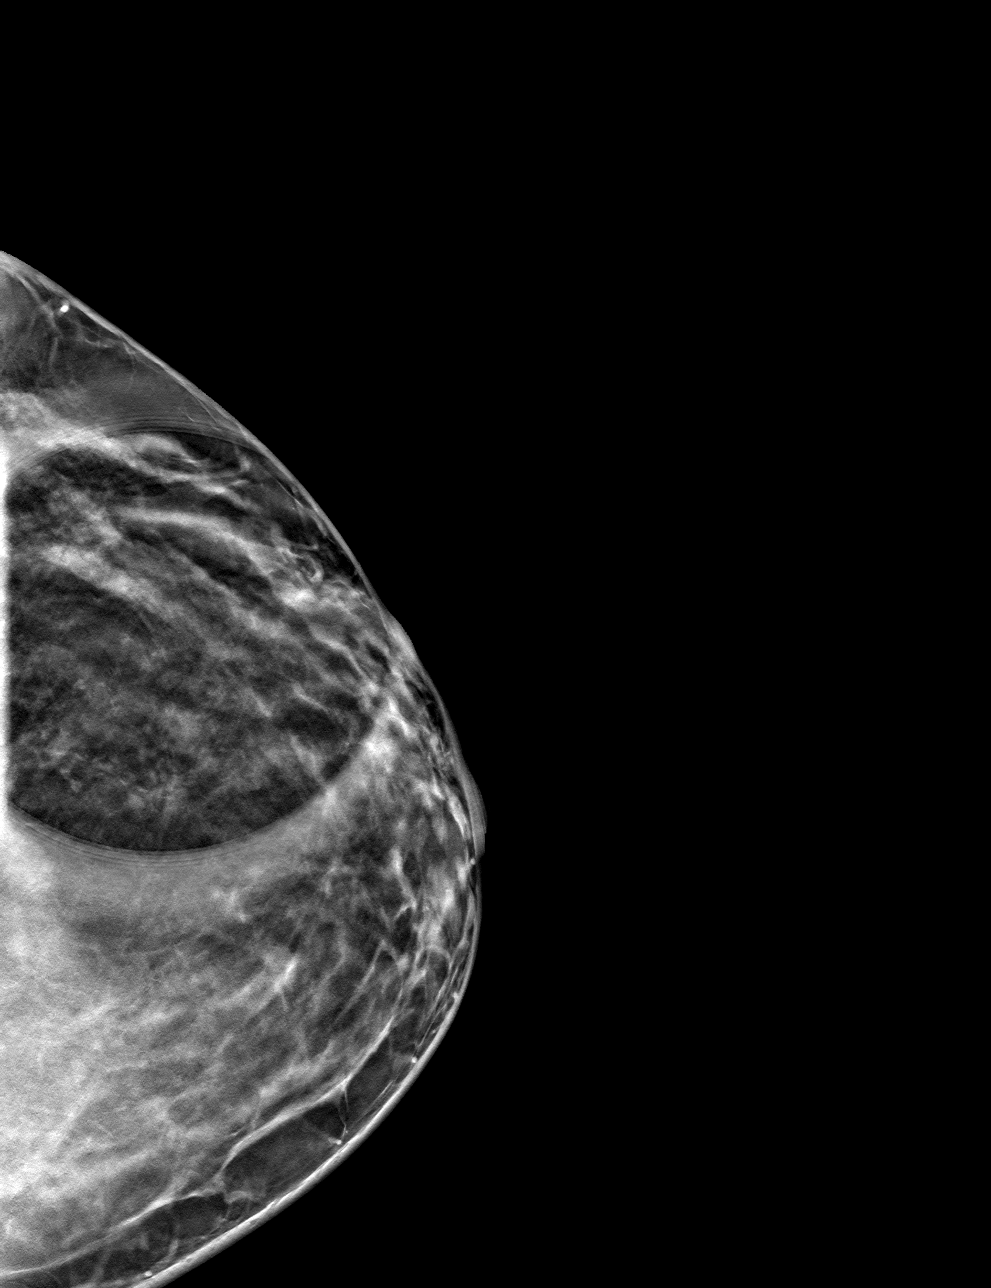

[4 of 12 positions shown; findings below may reference images not displayed]

ACR Breast Density Category c: The breast tissue is heterogeneously
dense, which may obscure small masses.
FINDINGS: No suspicious masses, calcifications, or distortion are identified
repeat imaging.

On physical exam, no suspicious lumps are identified.

Targeted ultrasound is performed, showing no sonographic
abnormalities in the breast.
IMPRESSION: No mammographic or sonographic evidence of malignancy.

RECOMMENDATION:
Annual screening mammography.

I have discussed the findings and recommendations with the patient.
If applicable, a reminder letter will be sent to the patient
regarding the next appointment.

BI-RADS CATEGORY  2: Benign.

## 2020-10-31 DIAGNOSIS — L259 Unspecified contact dermatitis, unspecified cause: Secondary | ICD-10-CM | POA: Diagnosis not present

## 2020-11-05 ENCOUNTER — Other Ambulatory Visit: Payer: Self-pay | Admitting: Family Medicine

## 2020-12-15 ENCOUNTER — Other Ambulatory Visit: Payer: Self-pay | Admitting: Family Medicine

## 2020-12-15 DIAGNOSIS — E785 Hyperlipidemia, unspecified: Secondary | ICD-10-CM

## 2020-12-15 DIAGNOSIS — E559 Vitamin D deficiency, unspecified: Secondary | ICD-10-CM

## 2020-12-15 DIAGNOSIS — Z1159 Encounter for screening for other viral diseases: Secondary | ICD-10-CM

## 2020-12-15 DIAGNOSIS — N289 Disorder of kidney and ureter, unspecified: Secondary | ICD-10-CM

## 2020-12-15 DIAGNOSIS — D72820 Lymphocytosis (symptomatic): Secondary | ICD-10-CM

## 2020-12-18 ENCOUNTER — Other Ambulatory Visit: Payer: Self-pay

## 2020-12-18 ENCOUNTER — Other Ambulatory Visit (INDEPENDENT_AMBULATORY_CARE_PROVIDER_SITE_OTHER): Payer: Federal, State, Local not specified - PPO

## 2020-12-18 DIAGNOSIS — E785 Hyperlipidemia, unspecified: Secondary | ICD-10-CM | POA: Diagnosis not present

## 2020-12-18 DIAGNOSIS — N289 Disorder of kidney and ureter, unspecified: Secondary | ICD-10-CM

## 2020-12-18 DIAGNOSIS — E559 Vitamin D deficiency, unspecified: Secondary | ICD-10-CM

## 2020-12-18 DIAGNOSIS — D72820 Lymphocytosis (symptomatic): Secondary | ICD-10-CM | POA: Diagnosis not present

## 2020-12-18 DIAGNOSIS — D709 Neutropenia, unspecified: Secondary | ICD-10-CM | POA: Diagnosis not present

## 2020-12-18 DIAGNOSIS — Z1159 Encounter for screening for other viral diseases: Secondary | ICD-10-CM

## 2020-12-18 LAB — COMPREHENSIVE METABOLIC PANEL
ALT: 18 U/L (ref 0–35)
AST: 22 U/L (ref 0–37)
Albumin: 4.5 g/dL (ref 3.5–5.2)
Alkaline Phosphatase: 57 U/L (ref 39–117)
BUN: 20 mg/dL (ref 6–23)
CO2: 31 mEq/L (ref 19–32)
Calcium: 9.8 mg/dL (ref 8.4–10.5)
Chloride: 101 mEq/L (ref 96–112)
Creatinine, Ser: 1.55 mg/dL — ABNORMAL HIGH (ref 0.40–1.20)
GFR: 38.17 mL/min — ABNORMAL LOW (ref >60.00)
Glucose, Bld: 94 mg/dL (ref 70–99)
Potassium: 4.3 mEq/L (ref 3.5–5.1)
Sodium: 139 mEq/L (ref 135–145)
Total Bilirubin: 0.5 mg/dL (ref 0.2–1.2)
Total Protein: 8 g/dL (ref 6.0–8.3)

## 2020-12-18 LAB — CBC WITH DIFFERENTIAL/PLATELET
Basophils Absolute: 0.1 10*3/uL (ref 0.0–0.1)
Basophils Relative: 1.5 % (ref 0.0–3.0)
Eosinophils Absolute: 0.1 10*3/uL (ref 0.0–0.7)
Eosinophils Relative: 2.4 % (ref 0.0–5.0)
HCT: 40.2 % (ref 36.0–46.0)
Hemoglobin: 13.4 g/dL (ref 12.0–15.0)
Lymphocytes Relative: 65.3 % — ABNORMAL HIGH (ref 12.0–46.0)
Lymphs Abs: 2.4 10*3/uL (ref 0.7–4.0)
MCHC: 33.4 g/dL (ref 30.0–36.0)
MCV: 88.1 fl (ref 78.0–100.0)
Monocytes Absolute: 0.3 10*3/uL (ref 0.1–1.0)
Monocytes Relative: 8.9 % (ref 3.0–12.0)
Neutro Abs: 0.8 10*3/uL — ABNORMAL LOW (ref 1.4–7.7)
Neutrophils Relative %: 21.9 % — ABNORMAL LOW (ref 43.0–77.0)
Platelets: 231 10*3/uL (ref 150.0–400.0)
RBC: 4.56 Mil/uL (ref 3.87–5.11)
RDW: 12.8 % (ref 11.5–15.5)
WBC: 3.7 10*3/uL — ABNORMAL LOW (ref 4.0–10.5)

## 2020-12-18 LAB — LIPID PANEL
Cholesterol: 196 mg/dL (ref 0–200)
HDL: 65.5 mg/dL (ref >39.00)
LDL Cholesterol: 109 mg/dL — ABNORMAL HIGH (ref 0–99)
NonHDL: 130.12
Total CHOL/HDL Ratio: 3
Triglycerides: 108 mg/dL (ref 0.0–149.0)
VLDL: 21.6 mg/dL (ref 0.0–40.0)

## 2020-12-18 LAB — VITAMIN D 25 HYDROXY (VIT D DEFICIENCY, FRACTURES): VITD: 50.37 ng/mL (ref 30.00–100.00)

## 2020-12-19 ENCOUNTER — Encounter: Payer: Self-pay | Admitting: Family Medicine

## 2020-12-19 DIAGNOSIS — D709 Neutropenia, unspecified: Secondary | ICD-10-CM | POA: Diagnosis not present

## 2020-12-19 DIAGNOSIS — N289 Disorder of kidney and ureter, unspecified: Secondary | ICD-10-CM | POA: Diagnosis not present

## 2020-12-19 NOTE — Addendum Note (Signed)
Addended by: Ellamae Sia on: 12/19/2020 12:03 PM   Modules accepted: Orders

## 2020-12-24 ENCOUNTER — Encounter: Payer: Self-pay | Admitting: Family Medicine

## 2020-12-24 ENCOUNTER — Ambulatory Visit (INDEPENDENT_AMBULATORY_CARE_PROVIDER_SITE_OTHER): Payer: Federal, State, Local not specified - PPO | Admitting: Family Medicine

## 2020-12-24 ENCOUNTER — Other Ambulatory Visit: Payer: Self-pay

## 2020-12-24 VITALS — BP 150/100 | HR 88 | Temp 96.4°F | Ht 66.0 in | Wt 220.0 lb

## 2020-12-24 DIAGNOSIS — D709 Neutropenia, unspecified: Secondary | ICD-10-CM

## 2020-12-24 DIAGNOSIS — E559 Vitamin D deficiency, unspecified: Secondary | ICD-10-CM

## 2020-12-24 DIAGNOSIS — C44599 Other specified malignant neoplasm of skin of other part of trunk: Secondary | ICD-10-CM

## 2020-12-24 DIAGNOSIS — Z0000 Encounter for general adult medical examination without abnormal findings: Secondary | ICD-10-CM

## 2020-12-24 DIAGNOSIS — K824 Cholesterolosis of gallbladder: Secondary | ICD-10-CM | POA: Diagnosis not present

## 2020-12-24 DIAGNOSIS — I10 Essential (primary) hypertension: Secondary | ICD-10-CM

## 2020-12-24 DIAGNOSIS — N1832 Chronic kidney disease, stage 3b: Secondary | ICD-10-CM

## 2020-12-24 DIAGNOSIS — I77811 Abdominal aortic ectasia: Secondary | ICD-10-CM | POA: Diagnosis not present

## 2020-12-24 DIAGNOSIS — Z90710 Acquired absence of both cervix and uterus: Secondary | ICD-10-CM | POA: Diagnosis not present

## 2020-12-24 DIAGNOSIS — D72820 Lymphocytosis (symptomatic): Secondary | ICD-10-CM

## 2020-12-24 DIAGNOSIS — K760 Fatty (change of) liver, not elsewhere classified: Secondary | ICD-10-CM

## 2020-12-24 DIAGNOSIS — E785 Hyperlipidemia, unspecified: Secondary | ICD-10-CM

## 2020-12-24 LAB — POCT URINALYSIS DIP (MANUAL ENTRY)
Bilirubin, UA: NEGATIVE
Blood, UA: NEGATIVE
Glucose, UA: NEGATIVE mg/dL
Ketones, POC UA: NEGATIVE mg/dL
Leukocytes, UA: NEGATIVE
Nitrite, UA: NEGATIVE
Protein Ur, POC: NEGATIVE mg/dL
Spec Grav, UA: 1.01 (ref 1.010–1.025)
Urobilinogen, UA: 0.2 E.U./dL
pH, UA: 6 (ref 5.0–8.0)

## 2020-12-24 LAB — PROTEIN ELECTROPHORESIS, SERUM, WITH REFLEX
Albumin ELP: 4.7 g/dL (ref 3.8–4.8)
Alpha 1: 0.2 g/dL (ref 0.2–0.3)
Alpha 2: 0.5 g/dL (ref 0.5–0.9)
Beta 2: 0.3 g/dL (ref 0.2–0.5)
Beta Globulin: 0.5 g/dL (ref 0.4–0.6)
Gamma Globulin: 1.5 g/dL (ref 0.8–1.7)
Total Protein: 7.7 g/dL (ref 6.1–8.1)

## 2020-12-24 LAB — TEST AUTHORIZATION

## 2020-12-24 LAB — MICROALBUMIN / CREATININE URINE RATIO
Creatinine,U: 38.9 mg/dL
Microalb Creat Ratio: 1.8 mg/g (ref 0.0–30.0)
Microalb, Ur: <0.7 mg/dL (ref 0.0–1.9)

## 2020-12-24 LAB — HEPATITIS C ANTIBODY
Hepatitis C Ab: NONREACTIVE
SIGNAL TO CUT-OFF: 0.02 (ref <1.00)

## 2020-12-24 LAB — PATHOLOGIST SMEAR REVIEW

## 2020-12-24 MED ORDER — MULTIVITAMIN ADULT PO TABS: 1.0000 | ORAL_TABLET | Freq: Every day | ORAL | Status: DC

## 2020-12-24 MED ORDER — AMLODIPINE BESYLATE 10 MG PO TABS: 10.0000 mg | ORAL_TABLET | Freq: Every day | ORAL | 3 refills | Status: DC

## 2020-12-24 MED ORDER — VITAMIN D3 25 MCG (1000 UT) PO CAPS: 1.0000 | ORAL_CAPSULE | Freq: Every day | ORAL | Status: DC

## 2020-12-24 NOTE — Assessment & Plan Note (Signed)
Will order repeat AAA duplex.

## 2020-12-24 NOTE — Assessment & Plan Note (Signed)
May have progressed from last check. SPEP and periph smear stable.  Repeat labs in 1 month. Consider heme eval.

## 2020-12-24 NOTE — Assessment & Plan Note (Signed)
Update abd us 

## 2020-12-24 NOTE — Addendum Note (Signed)
Addended by: Ria Bush on: 12/24/2020 01:45 PM   Modules accepted: Orders

## 2020-12-24 NOTE — Assessment & Plan Note (Signed)
Chronic, deteriorated. Will increase amlodipine to '10mg'$  daily, monitor for pedal edema.

## 2020-12-24 NOTE — Assessment & Plan Note (Signed)
Levels repleted on daily 1000 IU OTC

## 2020-12-24 NOTE — Assessment & Plan Note (Signed)
LFTs stable. Update abd [place].

## 2020-12-24 NOTE — Assessment & Plan Note (Signed)
Chronic, adequate off medication - discussed increasing fiber in diet.  The 10-year ASCVD risk score Mikey Bussing DC Brooke Bonito., et al., 2013) is: 5.8%   Values used to calculate the score:     Age: 55 years     Sex: Female     Is Non-Hispanic African American: Yes     Diabetic: No     Tobacco smoker: No     Systolic Blood Pressure: Q000111Q mmHg     Is BP treated: Yes     HDL Cholesterol: 65.5 mg/dL     Total Cholesterol: 196 mg/dL

## 2020-12-24 NOTE — Assessment & Plan Note (Signed)
Encouraged healthy diet and lifestyle to affect sustainable weight loss.  

## 2020-12-24 NOTE — Progress Notes (Addendum)
Patient ID: [name], female    DOB: 20-Jul-1965, 55 y.o.   MRN: [id number]  This visit was conducted in person.  BP (!) 150/100 (BP Location: Right Arm, Cuff Size: Large)   Pulse 88   Temp (!) 96.4 F (35.8 C) (Temporal)   Ht '5\' 6"'$  (1.676 m)   Wt 220 lb (99.8 kg)   SpO2 96%   BMI 35.51 kg/m    CC: CPE Subjective:   HPI: [name] is a 55 y.o. female presenting on 12/24/2020 for Annual Exam   HTN - on amlodipine '5mg'$  daily. Home BP readings overall running ok - 145/90 (3 wks ago). Today BP elevated in office.   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 Denies new soft tissue mass or skin changes in area.    Gallbladder polyp noted on abd [place] 01/2015 (41m). Also noted ectatic abd aorta - [place]pt [place] due for repeat  Preventative: COLONOSCOPY 10/2019 - benign polyp, rpt 10 yrs ([place]) [facility] [place] appt 02/2021 - normal paps in the past, h/o endometrial ablation. S/p TAH and RSO 07/2017 for fibroids. L ovary remains.  Mammogram - through OBGYN office - upcoming appt 02/2021.  LMP - regular periods until hysterectomy 07/2017.  Lung cancer screening - not eligible  Flu - declines COVID vaccine - pfizer x2 07/2019, booster 04/2020 Tdap - declines  Shingrix - discussed  Seat belt use discussed Sunscreen use discussed. No changing moles on skin. Saw dermatologist.  Non smoker Alcohol - rare Dentist [id number] exam yearly   Caffeine: 1 cup coffee/day Lives with husband, 3 sons Occupation: works for [employer] going back to school Activity: going to gym 3x/wk - cardio  Diet: good water, fruits and vegetables daily      Relevant past medical, surgical, family and social history reviewed and updated as indicated. Interim medical history since our last visit  reviewed. Allergies and medications reviewed and updated. Outpatient Medications Prior to Visit  Medication Sig Dispense Refill   fluticasone (FLONASE) 50 MCG/ACT nasal spray Use 2 spray(s) in each nostril once daily 48 g 0   ibuprofen (ADVIL) 800 MG tablet Take 1 tablet (800 mg total) by mouth every 8 (eight) hours as needed (mild pain). 30 tablet 1   levocetirizine (XYZAL) 5 MG tablet TAKE 1 TABLET BY MOUTH ONCE DAILY IN THE EVENING 90 tablet 0   amLODipine (NORVASC) 5 MG tablet Take 1 tablet (5 mg total) by mouth daily. 90 tablet 1   Cholecalciferol (VITAMIN D3) 1.25 MG (50000 UT) TABS Take 1 tablet by mouth once a week. 12 tablet 1   Ferrous Sulfate (IRON) 325 (65 Fe) MG TABS Take 1 tablet (325 mg total) by mouth every morning for 30 doses. 30 each 1   No facility-administered medications prior to visit.     Per HPI unless specifically indicated in ROS section below Review of Systems  Constitutional:  Negative for activity change, appetite change, chills, fatigue, fever and unexpected weight change.  HENT:  Negative for hearing loss.   Eyes:  Negative for visual disturbance.  Respiratory:  Negative for cough, chest tightness, shortness of breath and wheezing.   Cardiovascular:  Negative for chest pain, palpitations and leg swelling.  Gastrointestinal:  Negative for abdominal distention, abdominal pain, blood in stool, constipation, diarrhea,  nausea and vomiting.  Genitourinary:  Negative for difficulty urinating and hematuria.  Musculoskeletal:  Negative for arthralgias, myalgias and neck pain.  Skin:  Negative for rash.  Neurological:  Negative for dizziness, seizures, syncope and headaches.  Hematological:  Negative for adenopathy. Does not bruise/bleed easily.  Psychiatric/Behavioral:  Negative for dysphoric mood. The patient is not nervous/anxious.    Objective:  BP (!) 150/100 (BP Location: Right Arm, Cuff Size: Large)   Pulse 88   Temp (!) 96.4 F (35.8 C) (Temporal)   Ht  '5\' 6"'$  (1.676 m)   Wt 220 lb (99.8 kg)   SpO2 96%   BMI 35.51 kg/m   Wt Readings from Last 3 Encounters:  12/24/20 220 lb (99.8 kg)  10/13/19 217 lb (98.4 kg)  09/20/19 217 lb (98.4 kg)      Physical Exam Vitals and nursing note reviewed.  Constitutional:      Appearance: Normal appearance. She is not ill-appearing.  HENT:     Head: Normocephalic and atraumatic.     Right Ear: Tympanic membrane, ear canal and external ear normal. There is no impacted cerumen.     Left Ear: Tympanic membrane, ear canal and external ear normal. There is no impacted cerumen.  Eyes:     General:        Right eye: No discharge.        Left eye: No discharge.     Extraocular Movements: Extraocular movements intact.     Conjunctiva/sclera: Conjunctivae normal.     Pupils: Pupils are equal, round, and reactive to light.  Neck:     Thyroid: No thyroid mass or thyromegaly.  Cardiovascular:     Rate and Rhythm: Normal rate and regular rhythm.     Pulses: Normal pulses.     Heart sounds: Normal heart sounds. No murmur heard. Pulmonary:     Effort: Pulmonary effort is normal. No respiratory distress.     Breath sounds: Normal breath sounds. No wheezing, rhonchi or rales.  Abdominal:     General: Bowel sounds are normal. There is no distension.     Palpations: Abdomen is soft. There is no mass.     Tenderness: There is no abdominal tenderness. There is no guarding or rebound.     Hernia: No hernia is present.  Musculoskeletal:     Cervical back: Normal range of motion and neck supple. No rigidity.     Right lower leg: No edema.     Left lower leg: No edema.  Lymphadenopathy:     Cervical: No cervical adenopathy.  Skin:    General: Skin is warm and dry.     Findings: No rash.  Neurological:     General: No focal deficit present.     Mental Status: She is alert. Mental status is at baseline.  Psychiatric:        Mood and Affect: Mood normal.        Behavior: Behavior normal.      Results for  orders placed or performed in visit on 12/24/20  POCT urinalysis dipstick  Result Value Ref Range   Color, UA yellow yellow   Clarity, UA clear clear   Glucose, UA negative negative mg/dL   Bilirubin, UA negative negative   Ketones, POC UA negative negative mg/dL   Spec Grav, UA 1.010 1.010 - 1.025   Blood, UA negative negative   pH, UA 6.0 5.0 - 8.0   Protein Ur, POC negative negative mg/dL   Urobilinogen, UA 0.2  0.2 or 1.0 E.U./dL   Nitrite, UA Negative Negative   Leukocytes, UA Negative Negative   Lab Results  Component Value Date   CREATININE 1.55 (H) 12/18/2020   BUN 20 12/18/2020   NA 139 12/18/2020   K 4.3 12/18/2020   CL 101 12/18/2020   CO2 31 12/18/2020    Lab Results  Component Value Date   WBC 3.7 (L) 12/18/2020   HGB 13.4 12/18/2020   HCT 40.2 12/18/2020   MCV 88.1 12/18/2020   PLT 231.0 12/18/2020    Lab Results  Component Value Date   TSH 2.13 05/31/2019    Lab Results  Component Value Date   ALT 18 12/18/2020   AST 22 12/18/2020   ALKPHOS 57 12/18/2020   BILITOT 0.5 12/18/2020    Lab Results  Component Value Date   CHOL 196 12/18/2020   HDL 65.50 12/18/2020   LDLCALC 109 (H) 12/18/2020   TRIG 108.0 12/18/2020   CHOLHDL 3 12/18/2020    Assessment & Plan:  This visit occurred during the SARS-CoV-2 public health emergency.  Safety protocols were in place, including screening questions prior to the visit, additional usage of staff PPE, and extensive cleaning of exam room while observing appropriate contact time as indicated for disinfecting solutions.   Problem List Items Addressed This Visit     Health maintenance examination - Primary (Chronic)    Preventative protocols reviewed and updated unless pt declined. Discussed healthy diet and lifestyle.       Hx of total hysterectomy   Severe obesity (BMI 35.0-39.9) with comorbidity ([place])    Encouraged healthy diet and lifestyle to affect sustainable weight loss.       Dermatofibrosarcoma  protuberans of pubic region    Stable period without new mass, skin lesion, pain.       Vitamin D deficiency    Levels repleted on daily 1000 IU OTC      Essential hypertension, benign    Chronic, deteriorated. Will increase amlodipine to '10mg'$  daily, monitor for pedal edema.       Relevant Medications   amLODipine (NORVASC) 10 MG tablet   Relative lymphocytosis    Relative lymphocytosis due to absolute neutropenia. periph smear stable. Discussed possible hematology evaluation - will focus on kidney function.       Ectatic abdominal aorta (HCC)    Will order repeat AAA duplex.       Relevant Medications   amLODipine (NORVASC) 10 MG tablet   Other Relevant Orders   VAS [place] AAA DUPLEX   Hepatic steatosis    LFTs stable. Update abd [place].       Gallbladder polyp    Update abd [place].       Relevant Orders   US Abdomen Complete   Dyslipidemia    Chronic, adequate off medication - discussed increasing fiber in diet.  The 10-year ASCVD risk score Mikey Bussing DC Brooke Bonito., et al., 2013) is: 5.8%   Values used to calculate the score:     Age: 57 years     Sex: Female     Is Non-Hispanic African American: Yes     Diabetic: No     Tobacco smoker: No     Systolic Blood Pressure: Q000111Q mmHg     Is BP treated: Yes     HDL Cholesterol: 65.5 mg/dL     Total Cholesterol: 196 mg/dL       CKD (chronic kidney disease) stage 3, GFR 30-59 ml/min (HCC)    Progressive over  the last 18 months.  No dm hx. Check UA and Umicroalb today. Recent SPEP normal.  Anticipate HTN related. Goal BP <130/80 ideally if able to tolerate. Will start with goal <135/85. Recommend recheck labs in 1 month at HTN f/u visit. Pt agrees with plan.       Relevant Orders   Microalbumin / creatinine urine ratio   POCT urinalysis dipstick (Completed)   US Abdomen Complete   Renal function panel   Parathyroid hormone, intact (no Ca)   CBC with Differential/Platelet   Neutropenia ([place])    May have progressed from last check.  SPEP and periph smear stable.  Repeat labs in 1 month. Consider heme eval.         Meds ordered this encounter  Medications   Multiple Vitamin (MULTIVITAMIN ADULT) TABS    Sig: Take 1 tablet by mouth daily.   Cholecalciferol (VITAMIN D3) 25 MCG (1000 UT) CAPS    Sig: Take 1 capsule (1,000 Units total) by mouth daily.    Dispense:  30 capsule   amLODipine (NORVASC) 10 MG tablet    Sig: Take 1 tablet (10 mg total) by mouth daily.    Dispense:  90 tablet    Refill:  3    Note new dose    Orders Placed This Encounter  Procedures   US Abdomen Complete    Standing Status:   Future    Standing Expiration Date:   12/24/2021    Order Specific Question:   Reason for Exam (SYMPTOM  OR DIAGNOSIS REQUIRED)    Answer:   gallbladder polyp, renal insufficiency    Order Specific Question:   Preferred imaging location?    Answer:   [facility]   Microalbumin / creatinine urine ratio   Renal function panel    Standing Status:   Future    Standing Expiration Date:   12/24/2021   Parathyroid hormone, intact (no Ca)    Standing Status:   Future    Standing Expiration Date:   12/24/2021   CBC with Differential/Platelet    Standing Status:   Future    Standing Expiration Date:   12/24/2021   POCT urinalysis dipstick     Patient instrutions: We will schedule abdominal aorta imaging to follow mild dilation seen at last ultrasound as well as abdominal ultrasound to check kidneys and gallbladder.  Consider tetanus shot, consider shingles shot.  Urine test today (urinalysis and urine microalbumin).  Kidney function was deteriorated. Work on better blood pressure control. Increase amlodipine to '10mg'$  daily. Continue monitoring BP closely at home, let me know if persistently high, goal <135/85.  Limit salt/sodium in the diet, continue drinking plenty of water.  Return in 1 month for follow up visit to recheck blood pressures - we will recheck blood counts at that time as well.   Follow up  plan: Return in about 1 year (around 12/24/2021) for annual exam, prior fasting for blood work.  Ria Bush, MD

## 2020-12-24 NOTE — Assessment & Plan Note (Signed)
Preventative protocols reviewed and updated unless pt declined. Discussed healthy diet and lifestyle.  

## 2020-12-24 NOTE — Patient Instructions (Addendum)
We will schedule abdominal aorta imaging to follow mild dilation seen at last ultrasound as well as abdominal ultrasound to check kidneys and gallbladder.  Consider tetanus shot, consider shingles shot.  Urine test today (urinalysis and urine microalbumin).  Kidney function was deteriorated. Work on better blood pressure control. Increase amlodipine to '10mg'$  daily. Continue monitoring BP closely at home, let me know if persistently high, goal <135/85.  Limit salt/sodium in the diet, continue drinking plenty of water.  Return in 1 month for follow up visit to recheck blood pressures - we will recheck blood counts at that time as well.   Health Maintenance for Postmenopausal Women Menopause is a normal process in which your ability to get pregnant comes to an end. This process happens slowly over many months or years, usually between the ages of 55 and 60. Menopause is complete when you have missed your menstrualperiods for 12 months. It is important to talk with your health care provider about some of the most common conditions that affect women after menopause (postmenopausal women). These include heart disease, cancer, and bone loss (osteoporosis). Adopting a healthy lifestyle and getting preventive care can help to promote your health and wellness. The actions you take can also lower your chances ofdeveloping some of these common conditions. What should I know about menopause? During menopause, you may get a number of symptoms, such as: Hot flashes. These can be moderate or severe. Night sweats. Decrease in sex drive. Mood swings. Headaches. Tiredness. Irritability. Memory problems. Insomnia. Choosing to treat or not to treat these symptoms is a decision that you makewith your health care provider. Do I need hormone replacement therapy? Hormone replacement therapy is effective in treating symptoms that are caused by menopause, such as hot flashes and night sweats. Hormone replacement carries  certain risks, especially as you become older. If you are thinking about using estrogen or estrogen with progestin, discuss the benefits and risks with your health care provider. What is my risk for heart disease and stroke? The risk of heart disease, heart attack, and stroke increases as you age. One of the causes may be a change in the body's hormones during menopause. This can affect how your body uses dietary fats, triglycerides, and cholesterol. Heart attack and stroke are medical emergencies. There are many things that you cando to help prevent heart disease and stroke. Watch your blood pressure High blood pressure causes heart disease and increases the risk of stroke. This is more likely to develop in people who have high blood pressure readings, are of African descent, or are overweight. Have your blood pressure checked: Every 3-5 years if you are 84-43 years of age. Every year if you are 86 years old or older. Eat a healthy diet  Eat a diet that includes plenty of vegetables, fruits, low-fat dairy products, and lean protein. Do not eat a lot of foods that are high in solid fats, added sugars, or sodium.  Get regular exercise Get regular exercise. This is one of the most important things you can do for your health. Most adults should: Try to exercise for at least 150 minutes each week. The exercise should increase your heart rate and make you sweat (moderate-intensity exercise). Try to do strengthening exercises at least twice each week. Do these in addition to the moderate-intensity exercise. Spend less time sitting. Even light physical activity can be beneficial. Other tips Work with your health care provider to achieve or maintain a healthy weight. Do not use any products  that contain nicotine or tobacco, such as cigarettes, e-cigarettes, and chewing tobacco. If you need help quitting, ask your health care provider. Know your numbers. Ask your health care provider to check your  cholesterol and your blood sugar (glucose). Continue to have your blood tested as directed by your health care provider. Do I need screening for cancer? Depending on your health history and family history, you may need to have cancer screening at different stages of your life. This may include screening for: Breast cancer. Cervical cancer. Lung cancer. Colorectal cancer. What is my risk for osteoporosis? After menopause, you may be at increased risk for osteoporosis. Osteoporosis is a condition in which bone destruction happens more quickly than new bone creation. To help prevent osteoporosis or the bone fractures that can happen because of osteoporosis, you may take the following actions: If you are 49-25 years old, get at least 1,000 mg of calcium and at least 600 mg of vitamin D per day. If you are older than age 67 but younger than age 53, get at least 1,200 mg of calcium and at least 600 mg of vitamin D per day. If you are older than age 44, get at least 1,200 mg of calcium and at least 800 mg of vitamin D per day. Smoking and drinking excessive alcohol increase the risk of osteoporosis. Eat foods that are rich in calcium and vitamin D, and do weight-bearing exercisesseveral times each week as directed by your health care provider. How does menopause affect my mental health? Depression may occur at any age, but it is more common as you become older. Common symptoms of depression include: Low or sad mood. Changes in sleep patterns. Changes in appetite or eating patterns. Feeling an overall lack of motivation or enjoyment of activities that you previously enjoyed. Frequent crying spells. Talk with your health care provider if you think that you are experiencingdepression. General instructions See your health care provider for regular wellness exams and vaccines. This may include: Scheduling regular health, dental, and eye exams. Getting and maintaining your vaccines. These  include: Influenza vaccine. Get this vaccine each year before the flu season begins. Pneumonia vaccine. Shingles vaccine. Tetanus, diphtheria, and pertussis (Tdap) booster vaccine. Your health care provider may also recommend other immunizations. Tell your health care provider if you have ever been abused or do not feel safeat home. Summary Menopause is a normal process in which your ability to get pregnant comes to an end. This condition causes hot flashes, night sweats, decreased interest in sex, mood swings, headaches, or lack of sleep. Treatment for this condition may include hormone replacement therapy. Take actions to keep yourself healthy, including exercising regularly, eating a healthy diet, watching your weight, and checking your blood pressure and blood sugar levels. Get screened for cancer and depression. Make sure that you are up to date with all your vaccines. This information is not intended to replace advice given to you by your health care provider. Make sure you discuss any questions you have with your healthcare provider. Document Revised: 04/13/2018 Document Reviewed: 04/13/2018 Elsevier Patient Education  2022 [employer].

## 2020-12-24 NOTE — Addendum Note (Signed)
Addended by: Ria Bush on: 12/24/2020 01:51 PM   Modules accepted: Orders

## 2020-12-24 NOTE — Assessment & Plan Note (Signed)
Progressive over the last 18 months.  No dm hx. Check UA and Umicroalb today. Recent SPEP normal.  Anticipate HTN related. Goal BP <130/80 ideally if able to tolerate. Will start with goal <135/85. Recommend recheck labs in 1 month at HTN f/u visit. Pt agrees with plan.

## 2020-12-24 NOTE — Assessment & Plan Note (Signed)
Stable period without new mass, skin lesion, pain.

## 2020-12-24 NOTE — Assessment & Plan Note (Signed)
Relative lymphocytosis due to absolute neutropenia. periph smear stable. Discussed possible hematology evaluation - will focus on kidney function.

## 2020-12-28 ENCOUNTER — Encounter: Payer: Self-pay | Admitting: Family Medicine

## 2021-01-28 ENCOUNTER — Ambulatory Visit: Payer: Federal, State, Local not specified - PPO | Admitting: Family Medicine

## 2021-01-31 ENCOUNTER — Telehealth: Payer: Self-pay | Admitting: Family Medicine

## 2021-01-31 NOTE — Telephone Encounter (Signed)
Pt missed appt last week - likely due to confusion of change in location to [facility].  She does need f/u - please call and schedule this.

## 2021-02-03 NOTE — Telephone Encounter (Signed)
lvmtcb

## 2021-02-05 ENCOUNTER — Other Ambulatory Visit: Payer: Federal, State, Local not specified - PPO

## 2021-02-12 ENCOUNTER — Telehealth: Payer: Self-pay | Admitting: Family Medicine

## 2021-02-12 DIAGNOSIS — Z01419 Encounter for gynecological examination (general) (routine) without abnormal findings: Secondary | ICD-10-CM | POA: Diagnosis not present

## 2021-02-12 DIAGNOSIS — Z1231 Encounter for screening mammogram for malignant neoplasm of breast: Secondary | ICD-10-CM | POA: Diagnosis not present

## 2021-02-12 DIAGNOSIS — Z7689 Persons encountering health services in other specified circumstances: Secondary | ICD-10-CM | POA: Diagnosis not present

## 2021-02-12 LAB — HM MAMMOGRAPHY

## 2021-02-12 NOTE — Telephone Encounter (Signed)
Pt has a lab appt 02/13/2021 for urine, there are no orders in.

## 2021-02-12 NOTE — Telephone Encounter (Signed)
Pt called in stated she could not reach anyone to schedule ultrasound also have concern about BP medication . Would like a call back [phone]

## 2021-02-12 NOTE — Telephone Encounter (Signed)
Lab visit should be for blood work - labs future ordered. thanks

## 2021-02-12 NOTE — Telephone Encounter (Signed)
Returned pt's call concerning [place].  States she needed the phn # to [facility].  Pt couldn't remember which location so she asked for both.  However, pt is currently driving and asked that the phn #s be sent to her via [place].  Also, pt wanted to make Dr. Darnell Level aware that she was having bilateral hand/ankle swelling on amlodipine 10 mg.  So pt resumed 5 mg.  Swelling decreased but she doesn't think it's helping BP at that dose.  BP was elevated at recent GYN OV.  Plz advise

## 2021-02-13 ENCOUNTER — Other Ambulatory Visit: Payer: Self-pay

## 2021-02-13 ENCOUNTER — Other Ambulatory Visit: Payer: Federal, State, Local not specified - PPO

## 2021-02-13 MED ORDER — HYDROCHLOROTHIAZIDE 12.5 MG PO CAPS: 12.5000 mg | ORAL_CAPSULE | Freq: Every day | ORAL | 11 refills | Status: DC

## 2021-02-13 MED ORDER — AMLODIPINE BESYLATE 5 MG PO TABS: 5.0000 mg | ORAL_TABLET | Freq: Every day | ORAL | 0 refills | Status: DC

## 2021-02-13 NOTE — Addendum Note (Signed)
Addended by: Ria Bush on: 02/13/2021 11:39 AM   Modules accepted: Orders

## 2021-02-13 NOTE — Telephone Encounter (Addendum)
Noted. Recommend we add 2nd BP med -- I've sent in hctz 12.5mg  to take daily.  Will await labs from today and upcoming kidney ultrasound

## 2021-02-13 NOTE — Telephone Encounter (Addendum)
Spoke with pt relaying Dr. Synthia Innocent message.  Pt verbalizes understanding.    Also, pt mentioned she got a call from the pharmacy about her amlodipine.  Looks like only 1 tab was to be dispensed?

## 2021-02-13 NOTE — Telephone Encounter (Signed)
As her 10mg  amlodipine should last her 6 months (taking 1/2 tab), I sent the 1 tab to pharmacy as fyi for new dose to update their records.

## 2021-02-14 NOTE — Telephone Encounter (Signed)
[facility] relaying Dr. Synthia Innocent message.  Verbalizes understanding and documented in pt's info.

## 2021-03-03 ENCOUNTER — Encounter (HOSPITAL_COMMUNITY): Payer: Self-pay

## 2021-03-18 ENCOUNTER — Other Ambulatory Visit (HOSPITAL_COMMUNITY): Payer: Federal, State, Local not specified - PPO

## 2021-03-19 ENCOUNTER — Other Ambulatory Visit: Payer: Self-pay | Admitting: Family Medicine

## 2021-04-01 NOTE — Telephone Encounter (Addendum)
Pt hasn't come in for blood work yet, and hasn't completed abd [place] ordered - Has she had any trouble getting this set up? Please call and encourage she get both completed. If we need to get it rescheduled plz forward note to Ashtyn.

## 2021-04-01 NOTE — Telephone Encounter (Signed)
Lvm asking pt to call back.  Needs to schedule lab visit.  Also, need to find out if pt has had abd [place]. If not, fwd message to Ashtyn after scheduling lab visit.

## 2021-04-02 NOTE — Telephone Encounter (Signed)
Lvm asking pt to call back.  Needs to schedule lab visit.  Also, need to find out if pt has had abd [place]. If not, fwd message to Ashtyn after scheduling lab visit.

## 2021-04-03 NOTE — Telephone Encounter (Signed)
Lvm asking pt to call back.  Needs to schedule lab visit.  Also, need to find out if pt has had abd [place]. If not, fwd message to Ashtyn after scheduling lab visit.

## 2021-04-04 NOTE — Telephone Encounter (Addendum)
Sending MyChart message

## 2021-04-07 ENCOUNTER — Other Ambulatory Visit: Payer: Self-pay | Admitting: Family Medicine

## 2021-05-12 ENCOUNTER — Telehealth: Payer: Self-pay | Admitting: Family Medicine

## 2021-05-12 NOTE — Telephone Encounter (Signed)
Yes labs have been ordered.  She has upcoming renal US scheduled for later this month.

## 2021-05-12 NOTE — Telephone Encounter (Signed)
[name] stated that she was suppose to have repeat labs due to the place she was sent didn't do her labs correctly. And she is set to hve labs tomorrow at [place]

## 2021-05-13 ENCOUNTER — Other Ambulatory Visit (INDEPENDENT_AMBULATORY_CARE_PROVIDER_SITE_OTHER): Payer: Federal, State, Local not specified - PPO

## 2021-05-13 ENCOUNTER — Other Ambulatory Visit: Payer: Self-pay

## 2021-05-13 DIAGNOSIS — N1832 Chronic kidney disease, stage 3b: Secondary | ICD-10-CM

## 2021-05-13 LAB — RENAL FUNCTION PANEL
Albumin: 4.7 g/dL (ref 3.5–5.2)
BUN: 19 mg/dL (ref 6–23)
CO2: 29 mEq/L (ref 19–32)
Calcium: 9.5 mg/dL (ref 8.4–10.5)
Chloride: 98 mEq/L (ref 96–112)
Creatinine, Ser: 1.46 mg/dL — ABNORMAL HIGH (ref 0.40–1.20)
GFR: 40.89 mL/min — ABNORMAL LOW (ref >60.00)
Glucose, Bld: 80 mg/dL (ref 70–99)
Phosphorus: 4 mg/dL (ref 2.3–4.6)
Potassium: 3.8 mEq/L (ref 3.5–5.1)
Sodium: 135 mEq/L (ref 135–145)

## 2021-05-13 LAB — CBC WITH DIFFERENTIAL/PLATELET
Basophils Absolute: 0 10*3/uL (ref 0.0–0.1)
Basophils Relative: 0.8 % (ref 0.0–3.0)
Eosinophils Absolute: 0.1 10*3/uL (ref 0.0–0.7)
Eosinophils Relative: 1.9 % (ref 0.0–5.0)
HCT: 39.4 % (ref 36.0–46.0)
Hemoglobin: 13 g/dL (ref 12.0–15.0)
Lymphocytes Relative: 57.2 % — ABNORMAL HIGH (ref 12.0–46.0)
Lymphs Abs: 2.4 10*3/uL (ref 0.7–4.0)
MCHC: 32.9 g/dL (ref 30.0–36.0)
MCV: 88.1 fl (ref 78.0–100.0)
Monocytes Absolute: 0.3 10*3/uL (ref 0.1–1.0)
Monocytes Relative: 7.1 % (ref 3.0–12.0)
Neutro Abs: 1.4 10*3/uL (ref 1.4–7.7)
Neutrophils Relative %: 33 % — ABNORMAL LOW (ref 43.0–77.0)
Platelets: 218 10*3/uL (ref 150.0–400.0)
RBC: 4.47 Mil/uL (ref 3.87–5.11)
RDW: 12.4 % (ref 11.5–15.5)
WBC: 4.1 10*3/uL (ref 4.0–10.5)

## 2021-05-14 LAB — PARATHYROID HORMONE, INTACT (NO CA): PTH: 75 pg/mL (ref 16–77)

## 2021-05-23 ENCOUNTER — Encounter: Payer: Self-pay | Admitting: Family Medicine

## 2021-05-23 DIAGNOSIS — K824 Cholesterolosis of gallbladder: Secondary | ICD-10-CM

## 2021-05-23 DIAGNOSIS — I10 Essential (primary) hypertension: Secondary | ICD-10-CM

## 2021-05-23 DIAGNOSIS — Z833 Family history of diabetes mellitus: Secondary | ICD-10-CM

## 2021-05-23 DIAGNOSIS — N1832 Chronic kidney disease, stage 3b: Secondary | ICD-10-CM

## 2021-05-23 DIAGNOSIS — E785 Hyperlipidemia, unspecified: Secondary | ICD-10-CM

## 2021-05-26 ENCOUNTER — Other Ambulatory Visit: Payer: Self-pay

## 2021-05-26 ENCOUNTER — Ambulatory Visit
Admission: RE | Admit: 2021-05-26 | Discharge: 2021-05-26 | Disposition: A | Discharge status: Home / Self Care | Payer: Federal, State, Local not specified - PPO | Source: Ambulatory Visit | Attending: Family Medicine | Admitting: Family Medicine

## 2021-05-26 DIAGNOSIS — N200 Calculus of kidney: Secondary | ICD-10-CM | POA: Diagnosis not present

## 2021-05-26 DIAGNOSIS — K824 Cholesterolosis of gallbladder: Secondary | ICD-10-CM | POA: Diagnosis not present

## 2021-05-26 DIAGNOSIS — N1832 Chronic kidney disease, stage 3b: Secondary | ICD-10-CM

## 2021-05-27 ENCOUNTER — Telehealth: Payer: Self-pay

## 2021-05-27 DIAGNOSIS — N1330 Unspecified hydronephrosis: Secondary | ICD-10-CM

## 2021-05-27 DIAGNOSIS — R160 Hepatomegaly, not elsewhere classified: Secondary | ICD-10-CM

## 2021-05-27 DIAGNOSIS — K8020 Calculus of gallbladder without cholecystitis without obstruction: Secondary | ICD-10-CM

## 2021-05-27 DIAGNOSIS — K760 Fatty (change of) liver, not elsewhere classified: Secondary | ICD-10-CM

## 2021-05-27 DIAGNOSIS — I77811 Abdominal aortic ectasia: Secondary | ICD-10-CM

## 2021-05-27 DIAGNOSIS — K824 Cholesterolosis of gallbladder: Secondary | ICD-10-CM

## 2021-05-27 MED ORDER — AMLODIPINE BESYLATE 5 MG PO TABS
5.0000 mg | ORAL_TABLET | Freq: Two times a day (BID) | ORAL | 1 refills | Status: DC
Start: 2021-05-27 — End: 2022-01-22

## 2021-05-27 NOTE — Telephone Encounter (Signed)
Called, left message asking patient to call us back.

## 2021-05-27 NOTE — Telephone Encounter (Signed)
Spoke with patient.  Didn't take hctz.  Will increase amlodipine to 5mg  bid.   Home BP readings 131/88, 142/86.  She has intentionally lost 16 lbs.  She is interested in nutritionist referral - will place referral.   Hasn't noticed any trouble with urinary retention, incomplete emptying, trouble voiding.

## 2021-05-27 NOTE — Telephone Encounter (Signed)
Olivia Mackie with [facility] radiology called report on [place] of abdomen. Report is in [place]. Sending note to Dr Darnell Level and Lattie Haw CMA and will teams [facility].

## 2021-05-28 DIAGNOSIS — R160 Hepatomegaly, not elsewhere classified: Secondary | ICD-10-CM | POA: Insufficient documentation

## 2021-05-28 DIAGNOSIS — K8020 Calculus of gallbladder without cholecystitis without obstruction: Secondary | ICD-10-CM | POA: Insufficient documentation

## 2021-05-28 DIAGNOSIS — N1330 Unspecified hydronephrosis: Secondary | ICD-10-CM | POA: Insufficient documentation

## 2021-05-28 DIAGNOSIS — N139 Obstructive and reflux uropathy, unspecified: Secondary | ICD-10-CM | POA: Insufficient documentation

## 2021-05-28 DIAGNOSIS — N261 Atrophy of kidney (terminal): Secondary | ICD-10-CM | POA: Insufficient documentation

## 2021-05-28 DIAGNOSIS — K7689 Other specified diseases of liver: Secondary | ICD-10-CM | POA: Insufficient documentation

## 2021-05-28 NOTE — Telephone Encounter (Signed)
Late entry - spoke yesterday afternoon with patient regarding ultrasound findings including possible liver masses x2 and left kidney hydronephrosis. Will proceed with abdominal MRI for further evaluation of liver and left kidney.  Discussed OV, labs and further eval (urology vs other) pending MRI results.

## 2021-06-09 NOTE — Telephone Encounter (Signed)
Lvm asking pt to call back.  Need to relay Dr. G's message.  

## 2021-06-09 NOTE — Telephone Encounter (Signed)
Pt has not checked mychart - plz call her ask her to check mychart to schedule her MRI. Or give her # [phone] to call  [facility] to schedule in either [place] or [place].

## 2021-06-12 NOTE — Telephone Encounter (Signed)
Plz call to f/u on mychart message on blood pressures and remind her to schedule MRI.

## 2021-06-12 NOTE — Telephone Encounter (Signed)
Spoke with pt asking about BP on amlodipine 5 mg BID.  States she's checking it in the mornings and running in 806H systolic.  She couldn't remember bottom numbers but says it's been doing pretty good.  Also, reminded pt about scheduling MRI.  States she will get that done.  Pt is requesting HTN f/u.  Ok to add to schedule on 06/26/21 at 12:00?

## 2021-06-13 NOTE — Telephone Encounter (Signed)
Noted.  Plz add OV for pt on 06/26/20 at 12:00 for HTN f/u.  Pt is aware.

## 2021-06-13 NOTE — Telephone Encounter (Signed)
See Pt Msg on 05/23/21.

## 2021-06-13 NOTE — Telephone Encounter (Signed)
Yes ok to add at 12:00 on that date.

## 2021-06-26 ENCOUNTER — Ambulatory Visit: Payer: Federal, State, Local not specified - PPO | Admitting: Family

## 2021-06-26 ENCOUNTER — Encounter: Payer: Self-pay | Admitting: Family

## 2021-06-26 ENCOUNTER — Ambulatory Visit: Payer: Federal, State, Local not specified - PPO | Admitting: Family Medicine

## 2021-06-26 ENCOUNTER — Other Ambulatory Visit: Payer: Self-pay

## 2021-06-26 VITALS — BP 138/90 | HR 78 | Ht 65.5 in | Wt 206.0 lb

## 2021-06-26 DIAGNOSIS — K760 Fatty (change of) liver, not elsewhere classified: Secondary | ICD-10-CM | POA: Diagnosis not present

## 2021-06-26 DIAGNOSIS — K8020 Calculus of gallbladder without cholecystitis without obstruction: Secondary | ICD-10-CM

## 2021-06-26 DIAGNOSIS — I10 Essential (primary) hypertension: Secondary | ICD-10-CM

## 2021-06-26 DIAGNOSIS — N1330 Unspecified hydronephrosis: Secondary | ICD-10-CM | POA: Diagnosis not present

## 2021-06-26 DIAGNOSIS — N1832 Chronic kidney disease, stage 3b: Secondary | ICD-10-CM | POA: Diagnosis not present

## 2021-06-26 NOTE — Assessment & Plan Note (Signed)
Diet and exercise discussed with patient

## 2021-06-26 NOTE — Assessment & Plan Note (Signed)
Patient is working on controlling blood pressure which on recent visit seems to be better controlled on 10 mg amlodipine.  Patient to continue with this dosage.  She has discontinued her hydrochlorothiazide as well.  Given that patient with newer onset CKD as well as hydronephrosis noted on ultrasound I am going to refer her to nephrology for evaluation and treat

## 2021-06-26 NOTE — Progress Notes (Signed)
Established Patient Office Visit  Subjective:  Patient ID: [name], female    DOB: 11/28/1965  Age: 56 y.o. MRN: [id number]  CC:  Chief Complaint  Patient presents with   Hypertension    Follow up    HPI [name] is here today for follow up.  Pt is without acute concerns.  HTN: average at home seems to be around 130/70 or so. Doing well. Taking amlodipine 10 mg once daily. No pedal edema. Last visit in august was increased to 10 mg was on 5 mg prior. Pt decided to d/c her HCTZ on her own but doing ok.   HLD: last lab work mild elevation of LDL however much improved from prior.   Recent GFR was stable, however is on the lower range for her age. CKD stage 3B. Urine MA was negative.  Also reviewed her recent abdominal ultrasound which did show a moderate left hydronephrosis that was not seen in 2016 and was not of uncertain etiology and chronicity.  Patient has not seen a nephrologist CMP     Component Value Date/Time   NA 135 05/13/2021 0858   K 3.8 05/13/2021 0858   CL 98 05/13/2021 0858   CO2 29 05/13/2021 0858   GLUCOSE 80 05/13/2021 0858   BUN 19 05/13/2021 0858   CREATININE 1.46 (H) 05/13/2021 0858   CALCIUM 9.5 05/13/2021 0858   PROT CANCELED 12/19/2020 1202   ALBUMIN 4.7 05/13/2021 0858   AST 22 12/18/2020 0746   ALT 18 12/18/2020 0746   ALKPHOS 57 12/18/2020 0746   BILITOT 0.5 12/18/2020 0746   GFRNONAA >60 07/09/2017 0900   GFRAA >60 07/09/2017 0900    Abdominal ultrasound 05/26/21 also showed 2 hypoechoic masses that are seen in the left hepatic lobe with the largest is measuring 8 mm and is favored to represent a complicated cyst but is still indeterminate as well as a second that measures 9 mm that is not cystic.  The radiologist recommended an MRI that would further characterize these masses and MRI has been ordered from Dr. Danise Mina.  Patient is hesitant and wants to know more on this matter and if she really needs to go forward with the  MRI.  Past Medical History:  Diagnosis Date   Allergy    COVID-19 virus infection 06/2019   Dermatofibrosarcoma protuberans 2014   Ectatic abdominal aorta ([place]) 01/2015   rpt [place] 5 yrs   Fatty liver 01/2015   by US   Gallbladder polyp 01/2015   54mm by [place]   Hypertension    Iron deficiency    Seasonal allergies    Uterine fibroid 01/2015   markedly enlarged    Past Surgical History:  Procedure Laterality Date   COLONOSCOPY  10/2019   benign polyp, rpt 10 yrs ([place])   ENDOMETRIAL ABLATION  05/04/2008   fibroids   HYSTERECTOMY ABDOMINAL WITH SALPINGECTOMY Bilateral 07/22/2017   Procedure: HYSTERECTOMY ABDOMINAL WITH SALPINGECTOMY;  Surgeon: Molli Posey, MD;  Location: [facility];  Service: Gynecology;  Laterality: Bilateral;   MASS EXCISION     lower abdomen dermatofibrosarcoma ([facility])   OOPHORECTOMY Right 07/22/2017   Procedure: OOPHORECTOMY;  Surgeon: Molli Posey, MD;  Location: [facility];  Service: Gynecology;  Laterality: Right;   TUBAL LIGATION  05/05/1995    Family History  Problem Relation Age of Onset   Diabetes Mother    Hypertension Mother    Diabetes Father    Hypertension Father    Cancer Paternal Aunt 1  breast   Stroke Neg Hx    Coronary artery disease Neg Hx    Colon cancer Neg Hx    Esophageal cancer Neg Hx    Rectal cancer Neg Hx    Stomach cancer Neg Hx     Social History   Socioeconomic History   Marital status: Married    Spouse name: Not on file   Number of children: Not on file   Years of education: Not on file   Highest education level: Not on file  Occupational History   Not on file  Tobacco Use   Smoking status: Never   Smokeless tobacco: Never  Vaping Use   Vaping Use: Never used  Substance and Sexual Activity   Alcohol use: No   Drug use: No   Sexual activity: Not on file  Other Topics Concern   Not on file  Social History Narrative   Caffeine: 1 cup coffee/day   Lives with husband, 3 sons   Occupation: works for  [employer], started going back to school   Activity: going to gym 3x/wk   Diet: good water, fruits and vegetables daily   Social Determinants of Corporate investment banker Strain: Not on file  Food Insecurity: Not on file  Transportation Needs: Not on file  Physical Activity: Not on file  Stress: Not on file  Social Connections: Not on file  Intimate Partner Violence: Not on file    Outpatient Medications Prior to Visit  Medication Sig Dispense Refill   amLODipine (NORVASC) 5 MG tablet Take 1 tablet (5 mg total) by mouth in the morning and at bedtime. 180 tablet 1   Cholecalciferol (VITAMIN D3) 25 MCG (1000 UT) CAPS Take 1 capsule (1,000 Units total) by mouth daily. 30 capsule    fluticasone (FLONASE) 50 MCG/ACT nasal spray Use 2 spray(s) in each nostril once daily 48 g 0   ibuprofen (ADVIL) 800 MG tablet Take 1 tablet (800 mg total) by mouth every 8 (eight) hours as needed (mild pain). 30 tablet 1   levocetirizine (XYZAL) 5 MG tablet TAKE 1 TABLET BY MOUTH ONCE DAILY IN THE EVENING 90 tablet 0   Multiple Vitamin (MULTIVITAMIN ADULT) TABS Take 1 tablet by mouth daily.     No facility-administered medications prior to visit.    No Known Allergies  ROS Review of Systems  Review of Systems  Respiratory:  Negative for shortness of breath.   Cardiovascular:  Negative for chest pain and palpitations.  Gastrointestinal:  Negative for constipation and diarrhea.  Genitourinary:  Negative for dysuria, frequency and urgency.  Musculoskeletal:  Negative for myalgias.  Psychiatric/Behavioral:  Negative for depression and suicidal ideas.   All other systems reviewed and are negative.    Objective:    Physical Exam Vitals reviewed.  Constitutional:      General: She is not in acute distress.    Appearance: Normal appearance. She is obese. She is not ill-appearing, toxic-appearing or diaphoretic.  HENT:     Mouth/Throat:     Pharynx: No pharyngeal swelling.     Tonsils: No  tonsillar exudate.  Neck:     Thyroid: No thyroid mass.  Cardiovascular:     Rate and Rhythm: Normal rate and regular rhythm.  Abdominal:     General: Abdomen is flat. Bowel sounds are normal.     Palpations: Abdomen is soft.     Tenderness: There is no abdominal tenderness.  Musculoskeletal:     Comments: No flank pain on percussion/palpation  Lymphadenopathy:     Cervical:     Right cervical: No superficial cervical adenopathy.    Left cervical: No superficial cervical adenopathy.  Skin:    General: Skin is warm.  Neurological:     General: No focal deficit present.     Mental Status: She is alert and oriented to person, place, and time.  Psychiatric:        Mood and Affect: Mood normal.        Behavior: Behavior normal.        Thought Content: Thought content normal.        Judgment: Judgment normal.     BP 138/90    Pulse 78    Ht 5' 5.5" (1.664 m)    Wt 206 lb (93.4 kg)    SpO2 96%    BMI 33.76 kg/m  Wt Readings from Last 3 Encounters:  06/26/21 206 lb (93.4 kg)  12/24/20 220 lb (99.8 kg)  10/13/19 217 lb (98.4 kg)     Health Maintenance Due  Topic Date Due   HIV Screening  Never done   TETANUS/TDAP  Never done   Zoster Vaccines- Shingrix (1 of 2) Never done   PAP SMEAR-Modifier  12/24/2017   MAMMOGRAM  03/30/2018   COVID-19 Vaccine (4 - Booster for Pfizer series) 06/06/2020   INFLUENZA VACCINE  Never done    There are no preventive care reminders to display for this patient.  Lab Results  Component Value Date   TSH 2.13 05/31/2019   Lab Results  Component Value Date   WBC 4.1 05/13/2021   HGB 13.0 05/13/2021   HCT 39.4 05/13/2021   MCV 88.1 05/13/2021   PLT 218.0 05/13/2021   Lab Results  Component Value Date   NA 135 05/13/2021   K 3.8 05/13/2021   CO2 29 05/13/2021   GLUCOSE 80 05/13/2021   BUN 19 05/13/2021   CREATININE 1.46 (H) 05/13/2021   BILITOT 0.5 12/18/2020   ALKPHOS 57 12/18/2020   AST 22 12/18/2020   ALT 18 12/18/2020    PROT CANCELED 12/19/2020   ALBUMIN 4.7 05/13/2021   CALCIUM 9.5 05/13/2021   ANIONGAP 9 07/09/2017   GFR 40.89 (L) 05/13/2021   Lab Results  Component Value Date   CHOL 196 12/18/2020   Lab Results  Component Value Date   HDL 65.50 12/18/2020   Lab Results  Component Value Date   LDLCALC 109 (H) 12/18/2020   Lab Results  Component Value Date   TRIG 108.0 12/18/2020   Lab Results  Component Value Date   CHOLHDL 3 12/18/2020   No results found for: HGBA1C    Assessment & Plan:   Problem List Items Addressed This Visit       Cardiovascular and Mediastinum   Essential hypertension, benign    Patient currently okay with amlodipine as far as refills are concerned.  Continue 10 mg once daily as this is doing well.  Pt advised of the following:  Continue medication as prescribed. Monitor blood pressure periodically and/or when you feel symptomatic. Goal is <140/90 on average. Ensure that you have rested for 30 minutes prior to checking your blood pressure. Record your readings and bring them to your next visit if necessary.work on a low sodium diet.         Digestive   Hepatic steatosis    Diet and exercise discussed with patient      Gallstone    Discussed with patient a gallbladder healthy diet patient is  working on this and trying to exercise to lose some weight        Genitourinary   CKD (chronic kidney disease) stage 3, GFR 30-59 ml/min (HCC)    Patient is working on controlling blood pressure which on recent visit seems to be better controlled on 10 mg amlodipine.  Patient to continue with this dosage.  She has discontinued her hydrochlorothiazide as well.  Given that patient with newer onset CKD as well as hydronephrosis noted on ultrasound I am going to refer her to nephrology for evaluation and treat      Relevant Orders   Ambulatory referral to Nephrology   Hydronephrosis of left kidney - Primary    Referred to nephrology along with elevated creatinine  and decreased EGFR.      Relevant Orders   Ambulatory referral to Nephrology    No orders of the defined types were placed in this encounter.   Follow-up: Return in about 3 months (around 09/23/2021) for with Dr. Darnell Level for follow up appointment .    Eugenia Pancoast, FNP

## 2021-06-26 NOTE — Patient Instructions (Addendum)
A referral was placed today for neprhrology  Please let us know if you have not heard back within 1 week about your referral.  Continue to monitor your blood pressure periodically. Ensure that you have rested for 30 minutes prior to checking your blood pressure. Record your readings and bring them to your next visit.  It was a pleasure seeing you today! Please do not hesitate to reach out with any questions and or concerns.  Regards,   Eugenia Pancoast FNP-C

## 2021-06-26 NOTE — Assessment & Plan Note (Signed)
Patient currently okay with amlodipine as far as refills are concerned.  Continue 10 mg once daily as this is doing well.  Pt advised of the following:  Continue medication as prescribed. Monitor blood pressure periodically and/or when you feel symptomatic. Goal is <140/90 on average. Ensure that you have rested for 30 minutes prior to checking your blood pressure. Record your readings and bring them to your next visit if necessary.work on a low sodium diet.

## 2021-06-26 NOTE — Assessment & Plan Note (Signed)
Referred to nephrology along with elevated creatinine and decreased EGFR.

## 2021-06-26 NOTE — Assessment & Plan Note (Signed)
Discussed with patient a gallbladder healthy diet patient is working on this and trying to exercise to lose some weight

## 2021-07-02 ENCOUNTER — Encounter: Payer: Self-pay | Admitting: *Deleted

## 2021-07-21 ENCOUNTER — Encounter: Payer: Federal, State, Local not specified - PPO | Admitting: Dietician

## 2021-07-21 ENCOUNTER — Other Ambulatory Visit: Payer: Self-pay

## 2021-07-31 ENCOUNTER — Ambulatory Visit: Payer: Federal, State, Local not specified - PPO | Admitting: Family

## 2021-07-31 ENCOUNTER — Encounter: Payer: Self-pay | Admitting: Family

## 2021-07-31 VITALS — BP 128/78 | HR 82 | Temp 99.0°F | Resp 16 | Ht 65.5 in | Wt 196.6 lb

## 2021-07-31 DIAGNOSIS — J029 Acute pharyngitis, unspecified: Secondary | ICD-10-CM | POA: Insufficient documentation

## 2021-07-31 DIAGNOSIS — K5900 Constipation, unspecified: Secondary | ICD-10-CM | POA: Insufficient documentation

## 2021-07-31 DIAGNOSIS — H109 Unspecified conjunctivitis: Secondary | ICD-10-CM

## 2021-07-31 DIAGNOSIS — I10 Essential (primary) hypertension: Secondary | ICD-10-CM

## 2021-07-31 DIAGNOSIS — J020 Streptococcal pharyngitis: Secondary | ICD-10-CM | POA: Insufficient documentation

## 2021-07-31 DIAGNOSIS — B9689 Other specified bacterial agents as the cause of diseases classified elsewhere: Secondary | ICD-10-CM | POA: Insufficient documentation

## 2021-07-31 LAB — POCT RAPID STREP A (OFFICE): Rapid Strep A Screen: POSITIVE — AB

## 2021-07-31 MED ORDER — CIPROFLOXACIN HCL 0.3 % OP SOLN: OPHTHALMIC | 0 refills | Status: DC

## 2021-07-31 MED ORDER — AMOXICILLIN-POT CLAVULANATE 875-125 MG PO TABS: 1.0000 | ORAL_TABLET | Freq: Two times a day (BID) | ORAL | 0 refills | Status: DC

## 2021-07-31 NOTE — Assessment & Plan Note (Signed)
Strep tested in office, positive.  ?

## 2021-07-31 NOTE — Progress Notes (Signed)
? ?Established Patient Office Visit ? ?Subjective:  ?Patient ID: [name], female    DOB: 1966-04-29  Age: 56 y.o. MRN: [id number] ? ?CC:  ?Chief Complaint  ?Patient presents with  ? Eye Drainage  ? ? ?HPI ?[name] is here today with concerns.  ? ?Eyes have been with watery discharged, started last night and this am.  ?Woke up with crusting this am on bil eyes. Noticing clear discharge, maybe slight yellow ?Eyes gritty sensation, not really itching.  ?No pain with eye movement, vision is normal.  ?Yesterday woke up with a slight sore throat, not necessary this am.  ? ?Also c/o constipation. She is curious if maybe this is from her amlodipine.  ?She is exercising, takes metamucil for fiber, working on fiber in diet. She already drinks a lot of water during the day. Does have bowel movement once daily, however sometimes with rabbit sized pellets.  ?She is taking amlodipine 5 mg twice daily, does not like 10 mg together makes her 'feel weird' is curious if maybe this contributed to new onset constipation as it occurred around the time she started this for HTN.  ? ?Past Medical History:  ?Diagnosis Date  ? Allergy   ? COVID-19 virus infection 06/2019  ? Dermatofibrosarcoma protuberans 2014  ? Ectatic abdominal aorta ([place]) 01/2015  ? rpt [place] 5 yrs  ? Fatty liver 01/2015  ? by [place]  ? Gallbladder polyp 01/2015  ? 73m by [place] ? Hypertension   ? Iron deficiency   ? Seasonal allergies   ? Uterine fibroid 01/2015  ? markedly enlarged  ? ? ?Past Surgical History:  ?Procedure Laterality Date  ? COLONOSCOPY  10/2019  ? benign polyp, rpt 10 yrs ([place] [facility]  ? ENDOMETRIAL ABLATION  05/04/2008  ? fibroids  ? HYSTERECTOMY ABDOMINAL WITH SALPINGECTOMY Bilateral 07/22/2017  ? Procedure: HYSTERECTOMY ABDOMINAL WITH SALPINGECTOMY;  Surgeon: HMolli Posey MD;  [facility];  Service: Gynecology;  Laterality: Bilateral;  ? MASS EXCISION    ? lower abdomen dermatofibrosarcoma ([facility])  ? OOPHORECTOMY Right 07/22/2017  ? Procedure:  OOPHORECTOMY;  Surgeon: HMolli Posey MD;  [facility];  Service: Gynecology;  Laterality: Right;  ? TUBAL LIGATION  05/05/1995  ? ? ?Family History  ?Problem Relation Age of Onset  ? Diabetes Mother   ? Hypertension Mother   ? Diabetes Father   ? Hypertension Father   ? Cancer Paternal AAunt 5 ?     breast  ? Stroke Neg Hx   ? Coronary artery disease Neg Hx   ? Colon cancer Neg Hx   ? Esophageal cancer Neg Hx   ? Rectal cancer Neg Hx   ? Stomach cancer Neg Hx   ? ? ?Social History  ? ?Socioeconomic History  ? Marital status: Married  ?  Spouse name: Not on file  ? Number of children: Not on file  ? Years of education: Not on file  ? Highest education level: Not on file  ?Occupational History  ? Not on file  ?Tobacco Use  ? Smoking status: Never  ? Smokeless tobacco: Never  ?Vaping Use  ? Vaping Use: Never used  ?Substance and Sexual Activity  ? Alcohol use: No  ? Drug use: No  ? Sexual activity: Not on file  ?Other Topics Concern  ? Not on file  ?Social History Narrative  ? Caffeine: 1 cup coffee/day  ? Lives with husband, 3 sons  ? Occupation: works for [employer] started going back  to school  ? Activity: going to gym 3x/wk  ? Diet: good water, fruits and vegetables daily  ? ?Social Determinants of Health  ? ?Financial Resource Strain: Not on file  ?Food Insecurity: Not on file  ?Transportation Needs: Not on file  ?Physical Activity: Not on file  ?Stress: Not on file  ?Social Connections: Not on file  ?Intimate Partner Violence: Not on file  ? ? ?Outpatient Medications Prior to Visit  ?Medication Sig Dispense Refill  ? amLODipine (NORVASC) 5 MG tablet Take 1 tablet (5 mg total) by mouth in the morning and at bedtime. 180 tablet 1  ? Cholecalciferol (VITAMIN D3) 25 MCG (1000 UT) CAPS Take 1 capsule (1,000 Units total) by mouth daily. 30 capsule   ? fluticasone (FLONASE) 50 MCG/ACT nasal spray Use 2 spray(s) in each nostril once daily 48 g 0  ? ibuprofen (ADVIL) 800 MG tablet Take 1 tablet (800 mg total)  by mouth every 8 (eight) hours as needed (mild pain). 30 tablet 1  ? levocetirizine (XYZAL) 5 MG tablet TAKE 1 TABLET BY MOUTH ONCE DAILY IN THE EVENING 90 tablet 0  ? Multiple Vitamin (MULTIVITAMIN ADULT) TABS Take 1 tablet by mouth daily.    ? ?No facility-administered medications prior to visit.  ? ? ?No Known Allergies ? ?ROS ?Review of Systems  ?Constitutional:  Negative for chills and fever.  ?HENT:  Positive for sore throat. Negative for congestion, ear pain, postnasal drip, sinus pressure and sinus pain.   ?Eyes:  Positive for discharge (watery and yellow with crusting) and redness (right eye). Negative for pain, itching and visual disturbance (gritty sensation).  ?Respiratory:  Negative for cough, shortness of breath and wheezing.   ?Cardiovascular:  Negative for chest pain and palpitations.  ?Gastrointestinal:  Positive for constipation.  ? ?  ?Objective:  ?  ?Physical Exam ?Vitals reviewed.  ?Constitutional:   ?   General: She is not in acute distress. ?   Appearance: She is obese. She is not ill-appearing, toxic-appearing or diaphoretic.  ?HENT:  ?   Head: Normocephalic.  ?   Right Ear: Tympanic membrane normal.  ?   Left Ear: Tympanic membrane normal.  ?   Nose: Nose normal.  ?   Mouth/Throat:  ?   Mouth: Mucous membranes are moist.  ?Eyes:  ?   General: Lids are normal. No allergic shiner.    ?   Right eye: Discharge (wtery) present.     ?   Left eye: Discharge (watery) present. ?   Extraocular Movements: Extraocular movements intact.  ?   Conjunctiva/sclera:  ?   Right eye: Right conjunctiva is injected.  ?   Left eye: Left conjunctiva is not injected.  ?   Pupils: Pupils are equal, round, and reactive to light.  ?Cardiovascular:  ?   Rate and Rhythm: Normal rate and regular rhythm.  ?Pulmonary:  ?   Effort: Pulmonary effort is normal.  ?Musculoskeletal:  ?   Cervical back: Normal range of motion.  ?Neurological:  ?   Mental Status: She is alert.  ? ? ?BP 128/78   Pulse 82   Temp 99 ?F (37.2 ?C)    Resp 16   Ht 5' 5.5" (1.664 m)   Wt 196 lb 9 oz (89.2 kg)   SpO2 97%   BMI 32.21 kg/m?  ?Wt Readings from Last 3 Encounters:  ?07/31/21 196 lb 9 oz (89.2 kg)  ?06/26/21 206 lb (93.4 kg)  ?12/24/20 220 lb (99.8 kg)  ? ? ? ?  Health Maintenance Due  ?Topic Date Due  ? HIV Screening  Never done  ? TETANUS/TDAP  Never done  ? Zoster Vaccines- Shingrix (1 of 2) Never done  ? PAP SMEAR-Modifier  12/24/2017  ? MAMMOGRAM  03/30/2018  ? COVID-19 Vaccine (4 - Booster for Pfizer series) 06/06/2020  ? ? ?There are no preventive care reminders to display for this patient. ? ?Lab Results  ?Component Value Date  ? TSH 2.13 05/31/2019  ? ?Lab Results  ?Component Value Date  ? WBC 4.1 05/13/2021  ? HGB 13.0 05/13/2021  ? HCT 39.4 05/13/2021  ? MCV 88.1 05/13/2021  ? PLT 218.0 05/13/2021  ? ?Lab Results  ?Component Value Date  ? NA 135 05/13/2021  ? K 3.8 05/13/2021  ? CO2 29 05/13/2021  ? GLUCOSE 80 05/13/2021  ? BUN 19 05/13/2021  ? CREATININE 1.46 (H) 05/13/2021  ? BILITOT 0.5 12/18/2020  ? ALKPHOS 57 12/18/2020  ? AST 22 12/18/2020  ? ALT 18 12/18/2020  ? PROT CANCELED 12/19/2020  ? ALBUMIN 4.7 05/13/2021  ? CALCIUM 9.5 05/13/2021  ? ANIONGAP 9 07/09/2017  ? GFR 40.89 (L) 05/13/2021  ? ?No results found for: HGBA1C ? ?  ?Assessment & Plan:  ? ?Problem List Items Addressed This Visit   ? ?  ? Cardiovascular and Mediastinum  ? Essential hypertension, benign  ?  Continue amlodipine 5 mg twice daily. ?Pt advised of the following:  ?Continue medication as prescribed. Monitor blood pressure periodically and/or when you feel symptomatic. Goal is <130/90 on average. Ensure that you have rested for 30 minutes prior to checking your blood pressure. Record your readings and bring them to your next visit if necessary.work on a low sodium diet. ? ?  ?  ?  ? Respiratory  ? Strep pharyngitis  ?  Strep tested positive in office.  ?rx for amox 500 mg po bid x 10 days.  ?Ibuprofen/tyelnol prn sore throat/fever ?Pt told to F/u if no improvement  in the next 2-3 days. ? ?  ?  ? Relevant Medications  ? amoxicillin-clavulanate (AUGMENTIN) 875-125 MG tablet  ?  ? Other  ? Sore throat - Primary  ?  Strep tested in office, positive.  ?  ?  ? Relevant Medic

## 2021-07-31 NOTE — Patient Instructions (Addendum)
Eye drops were prescribed today and sent to your preferred pharmacy.  ?Start eye drop, and use as instructed.  ?When washing eye, wash from inner to outer canthus with warm wash cloth.  ?Wash bed sheets and pillow cases.  ?Throw out all old makeup, and try not to use makeup while duration of eye drops prescription.  ? ?You were found to be strep positive,  ?Take antibiotics that have been sent to the pharmacy.  ?Change your toothbrush after 24 hours on the antibiotics.  ?Gargle with warm salt water as needed for sore throat.  ? ?Can take over the counter stool softener and miralax as needed for constipation. If this continues to be ongoing, may need to consider amlodipine.  ? ?It was a pleasure seeing you today! Please do not hesitate to reach out with any questions and or concerns. ? ?Regards,  ? ?Marilene Vath ?FNP-C ? ?

## 2021-07-31 NOTE — Assessment & Plan Note (Signed)
Handout given on constipation ?Increase fluids ?Increase fiber ?miralax stool softener otc as needed ?

## 2021-07-31 NOTE — Assessment & Plan Note (Signed)
Strep tested positive in office.  rx for amox 500 mg po bid x 10 days.  Ibuprofen/tyelnol prn sore throat/fever Pt told to F/u if no improvement in the next 2-3 days.  

## 2021-07-31 NOTE — Assessment & Plan Note (Signed)
Continue amlodipine 5 mg twice daily. ?Pt advised of the following:  ?Continue medication as prescribed. Monitor blood pressure periodically and/or when you feel symptomatic. Goal is <130/90 on average. Ensure that you have rested for 30 minutes prior to checking your blood pressure. Record your readings and bring them to your next visit if necessary.work on a low sodium diet. ? ?

## 2021-07-31 NOTE — Assessment & Plan Note (Signed)
rx for ciprofloxacin 0.3% opth sent to pt pharmacy, take as prescribed.  ?Discussed how to wash eye appropriately with warm warm cloth from inner to outer canthus. Wash bed sheets and pillow cases to prevent re-infection. Frequent handwashing. If no improvement in 24-48 hours with eye drops as prescribed, please call office.  ? ?

## 2021-12-18 DIAGNOSIS — L089 Local infection of the skin and subcutaneous tissue, unspecified: Secondary | ICD-10-CM | POA: Diagnosis not present

## 2021-12-18 DIAGNOSIS — L729 Follicular cyst of the skin and subcutaneous tissue, unspecified: Secondary | ICD-10-CM | POA: Diagnosis not present

## 2021-12-18 DIAGNOSIS — L821 Other seborrheic keratosis: Secondary | ICD-10-CM | POA: Diagnosis not present

## 2021-12-18 DIAGNOSIS — L669 Cicatricial alopecia, unspecified: Secondary | ICD-10-CM | POA: Diagnosis not present

## 2021-12-20 ENCOUNTER — Other Ambulatory Visit: Payer: Self-pay | Admitting: Family Medicine

## 2021-12-20 DIAGNOSIS — E785 Hyperlipidemia, unspecified: Secondary | ICD-10-CM

## 2021-12-20 DIAGNOSIS — R160 Hepatomegaly, not elsewhere classified: Secondary | ICD-10-CM

## 2021-12-20 DIAGNOSIS — D72820 Lymphocytosis (symptomatic): Secondary | ICD-10-CM

## 2021-12-20 DIAGNOSIS — D709 Neutropenia, unspecified: Secondary | ICD-10-CM

## 2021-12-20 DIAGNOSIS — N1832 Chronic kidney disease, stage 3b: Secondary | ICD-10-CM

## 2021-12-20 DIAGNOSIS — C44599 Other specified malignant neoplasm of skin of other part of trunk: Secondary | ICD-10-CM

## 2021-12-20 DIAGNOSIS — E559 Vitamin D deficiency, unspecified: Secondary | ICD-10-CM

## 2021-12-23 ENCOUNTER — Other Ambulatory Visit (INDEPENDENT_AMBULATORY_CARE_PROVIDER_SITE_OTHER): Payer: Federal, State, Local not specified - PPO

## 2021-12-23 DIAGNOSIS — D709 Neutropenia, unspecified: Secondary | ICD-10-CM | POA: Diagnosis not present

## 2021-12-23 DIAGNOSIS — D72820 Lymphocytosis (symptomatic): Secondary | ICD-10-CM | POA: Diagnosis not present

## 2021-12-23 DIAGNOSIS — E559 Vitamin D deficiency, unspecified: Secondary | ICD-10-CM

## 2021-12-23 DIAGNOSIS — N1832 Chronic kidney disease, stage 3b: Secondary | ICD-10-CM

## 2021-12-23 DIAGNOSIS — E785 Hyperlipidemia, unspecified: Secondary | ICD-10-CM

## 2021-12-23 LAB — COMPREHENSIVE METABOLIC PANEL
ALT: 13 U/L (ref 0–35)
AST: 19 U/L (ref 0–37)
Albumin: 4.5 g/dL (ref 3.5–5.2)
Alkaline Phosphatase: 52 U/L (ref 39–117)
BUN: 17 mg/dL (ref 6–23)
CO2: 30 mEq/L (ref 19–32)
Calcium: 9.2 mg/dL (ref 8.4–10.5)
Chloride: 95 mEq/L — ABNORMAL LOW (ref 96–112)
Creatinine, Ser: 1.37 mg/dL — ABNORMAL HIGH (ref 0.40–1.20)
GFR: 43.95 mL/min — ABNORMAL LOW (ref >60.00)
Glucose, Bld: 80 mg/dL (ref 70–99)
Potassium: 4.1 mEq/L (ref 3.5–5.1)
Sodium: 135 mEq/L (ref 135–145)
Total Bilirubin: 0.7 mg/dL (ref 0.2–1.2)
Total Protein: 7.4 g/dL (ref 6.0–8.3)

## 2021-12-23 LAB — CBC WITH DIFFERENTIAL/PLATELET
Basophils Absolute: 0.1 10*3/uL (ref 0.0–0.1)
Basophils Relative: 1.3 % (ref 0.0–3.0)
Eosinophils Absolute: 0 10*3/uL (ref 0.0–0.7)
Eosinophils Relative: 1.1 % (ref 0.0–5.0)
HCT: 38.9 % (ref 36.0–46.0)
Hemoglobin: 12.9 g/dL (ref 12.0–15.0)
Lymphocytes Relative: 63 % — ABNORMAL HIGH (ref 12.0–46.0)
Lymphs Abs: 2.5 10*3/uL (ref 0.7–4.0)
MCHC: 33.3 g/dL (ref 30.0–36.0)
MCV: 89.6 fl (ref 78.0–100.0)
Monocytes Absolute: 0.3 10*3/uL (ref 0.1–1.0)
Monocytes Relative: 7.4 % (ref 3.0–12.0)
Neutro Abs: 1.1 10*3/uL — ABNORMAL LOW (ref 1.4–7.7)
Neutrophils Relative %: 27.2 % — ABNORMAL LOW (ref 43.0–77.0)
Platelets: 220 10*3/uL (ref 150.0–400.0)
RBC: 4.34 Mil/uL (ref 3.87–5.11)
RDW: 12.3 % (ref 11.5–15.5)
WBC: 4 10*3/uL (ref 4.0–10.5)

## 2021-12-23 LAB — LIPID PANEL
Cholesterol: 201 mg/dL — ABNORMAL HIGH (ref 0–200)
HDL: 82.3 mg/dL (ref >39.00)
LDL Cholesterol: 96 mg/dL (ref 0–99)
NonHDL: 119.09
Total CHOL/HDL Ratio: 2
Triglycerides: 117 mg/dL (ref 0.0–149.0)
VLDL: 23.4 mg/dL (ref 0.0–40.0)

## 2021-12-23 LAB — VITAMIN D 25 HYDROXY (VIT D DEFICIENCY, FRACTURES): VITD: 40.39 ng/mL (ref 30.00–100.00)

## 2021-12-24 LAB — PATHOLOGIST SMEAR REVIEW

## 2021-12-24 LAB — MICROALBUMIN / CREATININE URINE RATIO
Creatinine,U: 100.6 mg/dL
Microalb Creat Ratio: 2.4 mg/g (ref 0.0–30.0)
Microalb, Ur: 2.4 mg/dL — ABNORMAL HIGH (ref 0.0–1.9)

## 2021-12-24 LAB — PARATHYROID HORMONE, INTACT (NO CA): PTH: 62 pg/mL (ref 16–77)

## 2021-12-30 ENCOUNTER — Ambulatory Visit (INDEPENDENT_AMBULATORY_CARE_PROVIDER_SITE_OTHER): Payer: Federal, State, Local not specified - PPO | Admitting: Family Medicine

## 2021-12-30 ENCOUNTER — Encounter: Payer: Self-pay | Admitting: Family Medicine

## 2021-12-30 VITALS — BP 134/84 | HR 73 | Temp 97.3°F | Ht 64.25 in | Wt 197.0 lb

## 2021-12-30 DIAGNOSIS — R160 Hepatomegaly, not elsewhere classified: Secondary | ICD-10-CM

## 2021-12-30 DIAGNOSIS — Z0000 Encounter for general adult medical examination without abnormal findings: Secondary | ICD-10-CM

## 2021-12-30 DIAGNOSIS — E785 Hyperlipidemia, unspecified: Secondary | ICD-10-CM

## 2021-12-30 DIAGNOSIS — I77811 Abdominal aortic ectasia: Secondary | ICD-10-CM

## 2021-12-30 DIAGNOSIS — D72820 Lymphocytosis (symptomatic): Secondary | ICD-10-CM

## 2021-12-30 DIAGNOSIS — C44599 Other specified malignant neoplasm of skin of other part of trunk: Secondary | ICD-10-CM

## 2021-12-30 DIAGNOSIS — K5900 Constipation, unspecified: Secondary | ICD-10-CM

## 2021-12-30 DIAGNOSIS — E010 Iodine-deficiency related diffuse (endemic) goiter: Secondary | ICD-10-CM | POA: Diagnosis not present

## 2021-12-30 DIAGNOSIS — Z90710 Acquired absence of both cervix and uterus: Secondary | ICD-10-CM

## 2021-12-30 DIAGNOSIS — I10 Essential (primary) hypertension: Secondary | ICD-10-CM

## 2021-12-30 DIAGNOSIS — Z0001 Encounter for general adult medical examination with abnormal findings: Secondary | ICD-10-CM

## 2021-12-30 DIAGNOSIS — N1330 Unspecified hydronephrosis: Secondary | ICD-10-CM

## 2021-12-30 DIAGNOSIS — K760 Fatty (change of) liver, not elsewhere classified: Secondary | ICD-10-CM

## 2021-12-30 DIAGNOSIS — E559 Vitamin D deficiency, unspecified: Secondary | ICD-10-CM

## 2021-12-30 DIAGNOSIS — N1832 Chronic kidney disease, stage 3b: Secondary | ICD-10-CM

## 2021-12-30 DIAGNOSIS — K824 Cholesterolosis of gallbladder: Secondary | ICD-10-CM

## 2021-12-30 NOTE — Patient Instructions (Addendum)
We will requests latest mammogram from [place] (Dr Matthew Saras) in [place])  We will refer you to blood doctor and kidney doctor (in [place]).  We will order thyroid ultrasound for evaluation of possible enlarged thyroid. You may call and schedule this at your convenience [phone]) [phone] Return in 6 months for follow up visit.   Health Maintenance for Postmenopausal Women Menopause is a normal process in which your ability to get pregnant comes to an end. This process happens slowly over many months or years, usually between the ages of 69 and 57. Menopause is complete when you have missed your menstrual period for 12 months. It is important to talk with your health care provider about some of the most common conditions that affect women after menopause (postmenopausal women). These include heart disease, cancer, and bone loss (osteoporosis). Adopting a healthy lifestyle and getting preventive care can help to promote your health and wellness. The actions you take can also lower your chances of developing some of these common conditions. What are the signs and symptoms of menopause? During menopause, you may have the following symptoms: Hot flashes. These can be moderate or severe. Night sweats. Decrease in sex drive. Mood swings. Headaches. Tiredness (fatigue). Irritability. Memory problems. Problems falling asleep or staying asleep. Talk with your health care provider about treatment options for your symptoms. Do I need hormone replacement therapy? Hormone replacement therapy is effective in treating symptoms that are caused by menopause, such as hot flashes and night sweats. Hormone replacement carries certain risks, especially as you become older. If you are thinking about using estrogen or estrogen with progestin, discuss the benefits and risks with your health care provider. How can I reduce my risk for heart disease and stroke? The risk of heart disease, heart attack, and  stroke increases as you age. One of the causes may be a change in the body's hormones during menopause. This can affect how your body uses dietary fats, triglycerides, and cholesterol. Heart attack and stroke are medical emergencies. There are many things that you can do to help prevent heart disease and stroke. Watch your blood pressure High blood pressure causes heart disease and increases the risk of stroke. This is more likely to develop in people who have high blood pressure readings or are overweight. Have your blood pressure checked: Every 3-5 years if you are 53-57 years of age. Every year if you are 30 years old or older. Eat a healthy diet  Eat a diet that includes plenty of vegetables, fruits, low-fat dairy products, and lean protein. Do not eat a lot of foods that are high in solid fats, added sugars, or sodium. Get regular exercise Get regular exercise. This is one of the most important things you can do for your health. Most adults should: Try to exercise for at least 150 minutes each week. The exercise should increase your heart rate and make you sweat (moderate-intensity exercise). Try to do strengthening exercises at least twice each week. Do these in addition to the moderate-intensity exercise. Spend less time sitting. Even light physical activity can be beneficial. Other tips Work with your health care provider to achieve or maintain a healthy weight. Do not use any products that contain nicotine or tobacco. These products include cigarettes, chewing tobacco, and vaping devices, such as e-cigarettes. If you need help quitting, ask your health care provider. Know your numbers. Ask your health care provider to check your cholesterol and your blood sugar (glucose). Continue to have your blood tested  as directed by your health care provider. Do I need screening for cancer? Depending on your health history and family history, you may need to have cancer screenings at different  stages of your life. This may include screening for: Breast cancer. Cervical cancer. Lung cancer. Colorectal cancer. What is my risk for osteoporosis? After menopause, you may be at increased risk for osteoporosis. Osteoporosis is a condition in which bone destruction happens more quickly than new bone creation. To help prevent osteoporosis or the bone fractures that can happen because of osteoporosis, you may take the following actions: If you are 110-74 years old, get at least 1,000 mg of calcium and at least 600 international units (IU) of vitamin D per day. If you are older than age 86 but younger than age 7, get at least 1,200 mg of calcium and at least 600 international units (IU) of vitamin D per day. If you are older than age 74, get at least 1,200 mg of calcium and at least 800 international units (IU) of vitamin D per day. Smoking and drinking excessive alcohol increase the risk of osteoporosis. Eat foods that are rich in calcium and vitamin D, and do weight-bearing exercises several times each week as directed by your health care provider. How does menopause affect my mental health? Depression may occur at any age, but it is more common as you become older. Common symptoms of depression include: Feeling depressed. Changes in sleep patterns. Changes in appetite or eating patterns. Feeling an overall lack of motivation or enjoyment of activities that you previously enjoyed. Frequent crying spells. Talk with your health care provider if you think that you are experiencing any of these symptoms. General instructions See your health care provider for regular wellness exams and vaccines. This may include: Scheduling regular health, dental, and eye exams. Getting and maintaining your vaccines. These include: Influenza vaccine. Get this vaccine each year before the flu season begins. Pneumonia vaccine. Shingles vaccine. Tetanus, diphtheria, and pertussis (Tdap) booster vaccine. Your  health care provider may also recommend other immunizations. Tell your health care provider if you have ever been abused or do not feel safe at home. Summary Menopause is a normal process in which your ability to get pregnant comes to an end. This condition causes hot flashes, night sweats, decreased interest in sex, mood swings, headaches, or lack of sleep. Treatment for this condition may include hormone replacement therapy. Take actions to keep yourself healthy, including exercising regularly, eating a healthy diet, watching your weight, and checking your blood pressure and blood sugar levels. Get screened for cancer and depression. Make sure that you are up to date with all your vaccines. This information is not intended to replace advice given to you by your health care provider. Make sure you discuss any questions you have with your health care provider. Document Revised: 09/09/2020 Document Reviewed: 09/09/2020 Elsevier Patient Education  [employer].

## 2021-12-30 NOTE — Progress Notes (Unsigned)
Patient ID: [name], female    DOB: 19-Feb-1966, 56 y.o.   MRN: [id number]  This visit was conducted in person.  BP 134/84   Pulse 73   Temp (!) 97.3 F (36.3 C) (Temporal)   Ht 5' 4.25" (1.632 m)   Wt 197 lb (89.4 kg)   SpO2 95%   BMI 33.55 kg/m    CC: CPE Subjective:   HPI: [name] is a 56 y.o. female presenting on 12/30/2021 for Annual Exam   HTN - on amlodipine '5mg'$  daily. Home BP readings overall running ok - 145/90 (3 wks ago). Today BP elevated in office.    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 Denies new soft tissue mass or skin changes in area.    Abdominal ultrasound 05/2021 - moderate L hydronephrosis, new - in setting of new kidney insufficiency, referred to nephrology, did not go. Hypoechoic masses to left hepatic lobe - MRI ordered, did not go. Stable 4 mm gallbladder polyp - no need for follow up. 1cm gallstone - no symptoms of biliary colic. No aneurysm visualized.   Chronic kidney disease - latest GFR 44 improved from prior but still below previous baseline GFR in 80s (2020).   Chronic lymphocytosis of unclear cause - no unexpected weight loss, night sweats or fevers, enlarged glands/lymph nodes, abd pain, nausea, fatigue.   20+ lb weight loss - decreased portion sizes, increasing exercise regimen.    Preventative: COLONOSCOPY 10/2019 - benign polyp, rpt 10 yrs ([place]) Well woman with OBGYN (Dr Matthew Saras at [facility] [place]) yearly, latest 02/2021 - normal paps in the past, h/o endometrial ablation. S/p TAH and RSO 07/2017 for fibroids. L ovary remains.  Mammogram - through OBGYN office - 02/2021. Requested today  LMP - regular periods until hysterectomy 07/2017.  Lung cancer screening - not eligible  Flu - declines COVID vaccine - pfizer x2 07/2019, booster 04/2020 Tdap - declines  Shingrix - discussed,  declines Seat belt use discussed Sunscreen use discussed. No changing moles on skin. Saw dermatologist.  Sleep - averaging 7 hours/night  Non smoker Alcohol - rare Dentist q75mo Eye exam yearly  Constipation - thinks amlodipine related - managing with fiber and metamucil with benefit.    Caffeine: 1 cup coffee/day Lives with husband, 3 sons Occupation: works for [employer]: going to gym 3x/wk - cardio  Diet: good water, fruits and vegetables daily      Relevant past medical, surgical, family and social history reviewed and updated as indicated. Interim medical history since our last visit reviewed. Allergies and medications reviewed and updated. Outpatient Medications Prior to Visit  Medication Sig Dispense Refill   amLODipine (NORVASC) 5 MG tablet Take 1 tablet (5 mg total) by mouth in the morning and at bedtime. 180 tablet 1   Cholecalciferol (VITAMIN D3) 25 MCG (1000 UT) CAPS Take 1 capsule (1,000 Units total) by mouth daily. 30 capsule    fluticasone (FLONASE) 50 MCG/ACT nasal spray Use 2 spray(s) in each nostril once daily 48 g 0   ibuprofen (ADVIL) 800 MG tablet Take 1 tablet (800 mg total) by mouth every 8 (eight) hours as needed (mild pain). 30 tablet 1   levocetirizine (XYZAL) 5 MG tablet TAKE 1 TABLET BY MOUTH ONCE DAILY IN THE EVENING 90 tablet 0   Multiple Vitamin (MULTIVITAMIN ADULT) TABS Take 1 tablet by mouth daily.  amoxicillin-clavulanate (AUGMENTIN) 875-125 MG tablet Take 1 tablet by mouth 2 (two) times daily. 20 tablet 0   ciprofloxacin (CILOXAN) 0.3 % ophthalmic solution Administer 1 drop, every 2 hours, while awake, for 2 days. Then 1 drop, every 4 hours, while awake, for the next 5 days. 4.2 mL 0   No facility-administered medications prior to visit.     Per HPI unless specifically indicated in ROS section below Review of Systems  Constitutional:  Negative for activity change, appetite change, chills, fatigue, fever and unexpected weight change.   HENT:  Negative for hearing loss.   Eyes:  Negative for visual disturbance.  Respiratory:  Negative for cough, chest tightness, shortness of breath and wheezing.   Cardiovascular:  Negative for chest pain, palpitations and leg swelling.  Gastrointestinal:  Positive for constipation. Negative for abdominal distention, abdominal pain, blood in stool, diarrhea, nausea and vomiting.  Genitourinary:  Negative for difficulty urinating and hematuria.  Musculoskeletal:  Negative for arthralgias, myalgias and neck pain.  Skin:  Negative for rash.  Neurological:  Negative for dizziness, seizures, syncope and headaches.  Hematological:  Negative for adenopathy. Does not bruise/bleed easily.  Psychiatric/Behavioral:  Negative for dysphoric mood. The patient is not nervous/anxious.     Objective:  BP 134/84   Pulse 73   Temp (!) 97.3 F (36.3 C) (Temporal)   Ht 5' 4.25" (1.632 m)   Wt 197 lb (89.4 kg)   SpO2 95%   BMI 33.55 kg/m   Wt Readings from Last 3 Encounters:  12/30/21 197 lb (89.4 kg)  07/31/21 196 lb 9 oz (89.2 kg)  06/26/21 206 lb (93.4 kg)      Physical Exam Vitals and nursing note reviewed.  Constitutional:      Appearance: Normal appearance. She is not ill-appearing.  HENT:     Head: Normocephalic and atraumatic.     Right Ear: Tympanic membrane, ear canal and external ear normal. There is no impacted cerumen.     Left Ear: Tympanic membrane, ear canal and external ear normal. There is no impacted cerumen.  Eyes:     General:        Right eye: No discharge.        Left eye: No discharge.     Extraocular Movements: Extraocular movements intact.     Conjunctiva/sclera: Conjunctivae normal.     Pupils: Pupils are equal, round, and reactive to light.  Neck:     Thyroid: Thyromegaly (R sided) present. No thyroid mass or thyroid tenderness.     Vascular: No carotid bruit.  Cardiovascular:     Rate and Rhythm: Normal rate and regular rhythm.     Pulses: Normal pulses.      Heart sounds: Normal heart sounds. No murmur heard. Pulmonary:     Effort: Pulmonary effort is normal. No respiratory distress.     Breath sounds: Normal breath sounds. No wheezing, rhonchi or rales.  Abdominal:     General: Bowel sounds are normal. There is no distension.     Palpations: Abdomen is soft. There is no mass.     Tenderness: There is no abdominal tenderness. There is no guarding or rebound.     Hernia: No hernia is present.  Musculoskeletal:     Cervical back: Normal range of motion and neck supple. No rigidity.     Right lower leg: No edema.     Left lower leg: No edema.  Lymphadenopathy:     Cervical: No cervical adenopathy.  Skin:  General: Skin is warm and dry.     Findings: No rash.  Neurological:     General: No focal deficit present.     Mental Status: She is alert. Mental status is at baseline.  Psychiatric:        Mood and Affect: Mood normal.        Behavior: Behavior normal.       Results for orders placed or performed in visit on 12/23/21  Pathologist smear review  Result Value Ref Range   Path Review    Parathyroid hormone, intact (no Ca)  Result Value Ref Range   PTH 62 16 - 77 pg/mL  Microalbumin / creatinine urine ratio  Result Value Ref Range   Microalb, Ur 2.4 (H) 0.0 - 1.9 mg/dL   Creatinine,U 100.6 mg/dL   Microalb Creat Ratio 2.4 0.0 - 30.0 mg/g  CBC with Differential/Platelet  Result Value Ref Range   WBC 4.0 4.0 - 10.5 K/uL   RBC 4.34 3.87 - 5.11 Mil/uL   Hemoglobin 12.9 12.0 - 15.0 g/dL   HCT 38.9 36.0 - 46.0 %   MCV 89.6 78.0 - 100.0 fl   MCHC 33.3 30.0 - 36.0 g/dL   RDW 12.3 11.5 - 15.5 %   Platelets 220.0 150.0 - 400.0 K/uL   Neutrophils Relative % 27.2 (L) 43.0 - 77.0 %   Lymphocytes Relative 63.0 (H) 12.0 - 46.0 %   Monocytes Relative 7.4 3.0 - 12.0 %   Eosinophils Relative 1.1 0.0 - 5.0 %   Basophils Relative 1.3 0.0 - 3.0 %   Neutro Abs 1.1 (L) 1.4 - 7.7 K/uL   Lymphs Abs 2.5 0.7 - 4.0 K/uL   Monocytes Absolute 0.3  0.1 - 1.0 K/uL   Eosinophils Absolute 0.0 0.0 - 0.7 K/uL   Basophils Absolute 0.1 0.0 - 0.1 K/uL  Comprehensive metabolic panel  Result Value Ref Range   Sodium 135 135 - 145 mEq/L   Potassium 4.1 3.5 - 5.1 mEq/L   Chloride 95 (L) 96 - 112 mEq/L   CO2 30 19 - 32 mEq/L   Glucose, Bld 80 70 - 99 mg/dL   BUN 17 6 - 23 mg/dL   Creatinine, Ser 1.37 (H) 0.40 - 1.20 mg/dL   Total Bilirubin 0.7 0.2 - 1.2 mg/dL   Alkaline Phosphatase 52 39 - 117 U/L   AST 19 0 - 37 U/L   ALT 13 0 - 35 U/L   Total Protein 7.4 6.0 - 8.3 g/dL   Albumin 4.5 3.5 - 5.2 g/dL   GFR 43.95 (L) >60.00 mL/min   Calcium 9.2 8.4 - 10.5 mg/dL  Lipid panel  Result Value Ref Range   Cholesterol 201 (H) 0 - 200 mg/dL   Triglycerides 117.0 0.0 - 149.0 mg/dL   HDL 82.30 >39.00 mg/dL   VLDL 23.4 0.0 - 40.0 mg/dL   LDL Cholesterol 96 0 - 99 mg/dL   Total CHOL/HDL Ratio 2    NonHDL 119.09   VITAMIN D 25 Hydroxy (Vit-D Deficiency, Fractures)  Result Value Ref Range   VITD 40.39 30.00 - 100.00 ng/mL    Assessment & Plan:   Problem List Items Addressed This Visit   None    No orders of the defined types were placed in this encounter.  No orders of the defined types were placed in this encounter.    Patient instructions: We will requests latest mammogram from [place] (Dr Matthew Saras) in [place])  We will refer you to blood doctor  and kidney doctor (in [place]).  We will order thyroid ultrasound for evaluation of possible enlarged thyroid. You may call and schedule this at your convenience [phone]) [phone] Return in 6 months for follow up visit.   Follow up plan: No follow-ups on file.  Ria Bush, MD

## 2021-12-30 NOTE — Assessment & Plan Note (Signed)
Preventative protocols reviewed and updated unless pt declined. Discussed healthy diet and lifestyle.  

## 2021-12-31 ENCOUNTER — Telehealth: Payer: Self-pay | Admitting: Hematology and Oncology

## 2021-12-31 DIAGNOSIS — E041 Nontoxic single thyroid nodule: Secondary | ICD-10-CM | POA: Insufficient documentation

## 2021-12-31 DIAGNOSIS — E010 Iodine-deficiency related diffuse (endemic) goiter: Secondary | ICD-10-CM | POA: Insufficient documentation

## 2021-12-31 NOTE — Assessment & Plan Note (Addendum)
Congratulated on 20+ lb weight loss in the past year. Encouraged healthy diet and lifestyle choices to affect sustainable weight loss.

## 2021-12-31 NOTE — Assessment & Plan Note (Signed)
L ovary remains.

## 2021-12-31 NOTE — Assessment & Plan Note (Signed)
Decided to defer abd MRI - will start with nephrology and hematology evaluation.

## 2021-12-31 NOTE — Assessment & Plan Note (Signed)
Incidental finding on [place] 05/2021.

## 2021-12-31 NOTE — Telephone Encounter (Signed)
Scheduled appt per 8/30 referral. Pt is aware of appt date and time. Pt is aware to arrive 15 mins prior to appt time and to bring and updated insurance card. Pt is aware of appt location.   

## 2021-12-31 NOTE — Assessment & Plan Note (Addendum)
Relative lymphocytosis persists in setting of low normal WBC. Other cell lines normal. SPEP previously normal, periph smear stable. In setting of CKD, discussed further evaluation of this - will refer to hematology.

## 2021-12-31 NOTE — Assessment & Plan Note (Addendum)
Right sided - check thyroid US. Will need thyroid function checked next labwork.

## 2021-12-31 NOTE — Assessment & Plan Note (Addendum)
Chronic, stable off medication - improved readings after weight loss. The 10-year ASCVD risk score (Arnett DK, et al., 2019) is: 3.2%   Values used to calculate the score:     Age: 56 years     Sex: Female     Is Non-Hispanic African American: Yes     Diabetic: No     Tobacco smoker: No     Systolic Blood Pressure: 715 mmHg     Is BP treated: Yes     HDL Cholesterol: 82.3 mg/dL     Total Cholesterol: 201 mg/dL

## 2021-12-31 NOTE — Assessment & Plan Note (Addendum)
Normal aorta on [place] earlier this year.

## 2021-12-31 NOTE — Assessment & Plan Note (Signed)
CKD persists with GFR from 80s down to 30-40s - will refer to nephrology for further evaluation.

## 2021-12-31 NOTE — Assessment & Plan Note (Signed)
No signs of recurrence 

## 2021-12-31 NOTE — Assessment & Plan Note (Signed)
Chronic, stable on amlodipine '5mg'$  daily. Anticipate weight loss has helped with blood pressure control.

## 2021-12-31 NOTE — Assessment & Plan Note (Signed)
Manages with fiber in diet and metamucil.

## 2021-12-31 NOTE — Assessment & Plan Note (Signed)
Benign on latest abd [place]

## 2021-12-31 NOTE — Assessment & Plan Note (Signed)
Levels adequate on 1000 IU replacement

## 2022-01-02 ENCOUNTER — Encounter: Payer: Self-pay | Admitting: Family Medicine

## 2022-01-15 ENCOUNTER — Ambulatory Visit
Admission: RE | Admit: 2022-01-15 | Discharge: 2022-01-15 | Disposition: A | Discharge status: Home / Self Care | Payer: Federal, State, Local not specified - PPO | Source: Ambulatory Visit | Attending: Family Medicine | Admitting: Family Medicine

## 2022-01-15 ENCOUNTER — Other Ambulatory Visit: Payer: Self-pay | Admitting: Family Medicine

## 2022-01-15 DIAGNOSIS — E042 Nontoxic multinodular goiter: Secondary | ICD-10-CM | POA: Diagnosis not present

## 2022-01-15 DIAGNOSIS — E010 Iodine-deficiency related diffuse (endemic) goiter: Secondary | ICD-10-CM

## 2022-01-15 DIAGNOSIS — E041 Nontoxic single thyroid nodule: Secondary | ICD-10-CM

## 2022-01-19 ENCOUNTER — Other Ambulatory Visit: Payer: Self-pay | Admitting: Family Medicine

## 2022-01-19 DIAGNOSIS — N1330 Unspecified hydronephrosis: Secondary | ICD-10-CM

## 2022-01-19 DIAGNOSIS — N1832 Chronic kidney disease, stage 3b: Secondary | ICD-10-CM

## 2022-01-20 ENCOUNTER — Telehealth: Payer: Self-pay | Admitting: Internal Medicine

## 2022-01-20 ENCOUNTER — Other Ambulatory Visit (INDEPENDENT_AMBULATORY_CARE_PROVIDER_SITE_OTHER): Payer: Federal, State, Local not specified - PPO

## 2022-01-20 ENCOUNTER — Other Ambulatory Visit: Payer: Self-pay | Admitting: Family Medicine

## 2022-01-20 DIAGNOSIS — E041 Nontoxic single thyroid nodule: Secondary | ICD-10-CM

## 2022-01-20 LAB — T4, FREE: Free T4: 0.85 ng/dL (ref 0.60–1.60)

## 2022-01-20 LAB — TSH: TSH: 1.45 u[IU]/mL (ref 0.35–5.50)

## 2022-01-20 NOTE — Telephone Encounter (Signed)
R/s pt's new hem appt. Pt is aware of new appt date/time.  

## 2022-01-22 ENCOUNTER — Other Ambulatory Visit: Payer: Self-pay

## 2022-01-22 ENCOUNTER — Inpatient Hospital Stay: Payer: Federal, State, Local not specified - PPO | Attending: Hematology and Oncology

## 2022-01-22 ENCOUNTER — Other Ambulatory Visit: Payer: Federal, State, Local not specified - PPO

## 2022-01-22 ENCOUNTER — Other Ambulatory Visit: Payer: Self-pay | Admitting: Family Medicine

## 2022-01-22 ENCOUNTER — Inpatient Hospital Stay: Payer: Federal, State, Local not specified - PPO | Admitting: Hematology and Oncology

## 2022-01-22 ENCOUNTER — Encounter: Payer: Federal, State, Local not specified - PPO | Admitting: Hematology and Oncology

## 2022-01-22 ENCOUNTER — Other Ambulatory Visit: Payer: Self-pay | Admitting: Hematology and Oncology

## 2022-01-22 VITALS — BP 173/91 | HR 72 | Temp 97.9°F | Resp 15 | Wt 197.5 lb

## 2022-01-22 DIAGNOSIS — D700 Congenital agranulocytosis: Secondary | ICD-10-CM

## 2022-01-22 DIAGNOSIS — D709 Neutropenia, unspecified: Secondary | ICD-10-CM | POA: Diagnosis not present

## 2022-01-22 DIAGNOSIS — I10 Essential (primary) hypertension: Secondary | ICD-10-CM

## 2022-01-22 LAB — CBC WITH DIFFERENTIAL (CANCER CENTER ONLY)
Abs Immature Granulocytes: 0 10*3/uL (ref 0.00–0.07)
Basophils Absolute: 0 10*3/uL (ref 0.0–0.1)
Basophils Relative: 1 %
Eosinophils Absolute: 0 10*3/uL (ref 0.0–0.5)
Eosinophils Relative: 1 %
HCT: 40 % (ref 36.0–46.0)
Hemoglobin: 13.6 g/dL (ref 12.0–15.0)
Immature Granulocytes: 0 %
Lymphocytes Relative: 55 %
Lymphs Abs: 2.4 10*3/uL (ref 0.7–4.0)
MCH: 30 pg (ref 26.0–34.0)
MCHC: 34 g/dL (ref 30.0–36.0)
MCV: 88.1 fL (ref 80.0–100.0)
Monocytes Absolute: 0.3 10*3/uL (ref 0.1–1.0)
Monocytes Relative: 8 %
Neutro Abs: 1.5 10*3/uL — ABNORMAL LOW (ref 1.7–7.7)
Neutrophils Relative %: 35 %
Platelet Count: 220 10*3/uL (ref 150–400)
RBC: 4.54 MIL/uL (ref 3.87–5.11)
RDW: 11.7 % (ref 11.5–15.5)
WBC Count: 4.3 10*3/uL (ref 4.0–10.5)
nRBC: 0 % (ref 0.0–0.2)

## 2022-01-22 LAB — CMP (CANCER CENTER ONLY)
ALT: 13 U/L (ref 0–44)
AST: 19 U/L (ref 15–41)
Albumin: 4.7 g/dL (ref 3.5–5.0)
Alkaline Phosphatase: 54 U/L (ref 38–126)
Anion gap: 6 (ref 5–15)
BUN: 17 mg/dL (ref 6–20)
CO2: 31 mmol/L (ref 22–32)
Calcium: 9.6 mg/dL (ref 8.9–10.3)
Chloride: 101 mmol/L (ref 98–111)
Creatinine: 1.27 mg/dL — ABNORMAL HIGH (ref 0.44–1.00)
GFR, Estimated: 50 mL/min — ABNORMAL LOW (ref >60)
Glucose, Bld: 87 mg/dL (ref 70–99)
Potassium: 3.7 mmol/L (ref 3.5–5.1)
Sodium: 138 mmol/L (ref 135–145)
Total Bilirubin: 0.5 mg/dL (ref 0.3–1.2)
Total Protein: 8.3 g/dL — ABNORMAL HIGH (ref 6.5–8.1)

## 2022-01-22 LAB — LACTATE DEHYDROGENASE: LDH: 148 U/L (ref 98–192)

## 2022-01-22 LAB — VITAMIN B12: Vitamin B-12: 672 pg/mL (ref 180–914)

## 2022-01-22 LAB — FOLATE: Folate: 26.7 ng/mL (ref >5.9)

## 2022-01-22 LAB — SEDIMENTATION RATE: Sed Rate: 18 mm/hr (ref 0–22)

## 2022-01-22 LAB — C-REACTIVE PROTEIN: CRP: 1.2 mg/dL — ABNORMAL HIGH (ref <1.0)

## 2022-01-22 NOTE — Progress Notes (Signed)
[place] [phone]   Fax:([phone] [place] NOTE  Patient Care Team: Ria Bush, MD as PCP - General (Family Medicine)  Hematological/Oncological History # Mild Neutropenia # Relative Lymphocytosis 12/23/2021: WBC 4.0, Hgb 12.9, MCV 89.6, Plt 220. Neutrophils 27.2%, Lymphocytes 63%, ANC 1.1 01/22/2022: establish care with Dr. Lorenso Courier   CHIEF COMPLAINTS/PURPOSE OF CONSULTATION:  "Mild Neutropenia "  HISTORY OF PRESENTING ILLNESS:  [name] 56 y.o. female with medical history significant for uterine fibroids, hypertension, fatty liver disease, and dermatofibrosarcoma protuberans who presents for evaluation of a relative lymphocytosis with mild neutropenia.  On review of the previous records [name] has had a mild neutropenia dating back to at least 03/24/11.  At that time she had an Vandiver of 1.1 with a mild leukopenia of 3.5.  Since that time she has had a lymphocytic predominant white blood cell count with this mild neutropenia.  Most recently on 12/23/2021 patient white blood cell count 4.0, hemoglobin 12.9, MCV 89.6, and platelets of 220.  ANC was 1.1.  Due to concern for this chronic neutropenia the patient was referred to hematology for further evaluation and management.  On exam today [name] reports that she has had no recent infections.  She has not had any issues with sinus infections, pneumonias, or UTI.  She did have COVID infection back in 2021 2021.  She reports that she was told she had high white blood cell count today.  She notes that her oldest son has issues with a low white blood cell count.  Other than seasonal allergies she is quite healthy.  She is not on any kind special diet such as vegetarianism or vegan.  She reports that she eats red meat occasionally about once per month.  She notes her energy levels are stable though as she gets older her energy levels have been declining.  On further discussion she  reports that her oldest sister and her son had leukemia.  She has 4 children and her son has low white blood cell counts.  Her mother and father both had diabetes and hypertension.  She is a never smoker, drinks occasionally, once every couple months.  She currently works as a Teacher, music.  He has had no other changes in her health.  She has an upcoming visit with urology because she had issues with her left kidney and it was "swollen".  She currently denies any fevers, chills, sweats, nausea, vomiting or diarrhea.  A full 10 point ROS is listed below.  MEDICAL HISTORY:  Past Medical History:  Diagnosis Date   Allergy    COVID-19 virus infection 06/2019   Dermatofibrosarcoma protuberans 2014   Ectatic abdominal aorta ([place]) 01/2015   rpt [place] 5 yrs   Fatty liver 01/2015   by [place]   Gallbladder polyp 01/2015   67m by [place]  Hypertension    Iron deficiency    Seasonal allergies    Uterine fibroid 01/2015   markedly enlarged    SURGICAL HISTORY: Past Surgical History:  Procedure Laterality Date   COLONOSCOPY  10/2019   benign polyp, rpt 10 yrs ([place])   ENDOMETRIAL ABLATION  05/04/2008   fibroids   HYSTERECTOMY ABDOMINAL WITH SALPINGECTOMY Bilateral 07/22/2017   Procedure: HYSTERECTOMY ABDOMINAL WITH SALPINGECTOMY;  Surgeon: HMolli Posey MD;  [facility];  Service: Gynecology;  Laterality: Bilateral;   MASS EXCISION     lower abdomen dermatofibrosarcoma ([facility]S)   OOPHORECTOMY Right 07/22/2017   Procedure: OOPHORECTOMY;  Surgeon: Molli Posey, MD;  Location: [facility];  Service: Gynecology;  Laterality: Right;   TUBAL LIGATION  05/05/1995    SOCIAL HISTORY: Social History   Socioeconomic History   Marital status: Married    Spouse name: Not on file   Number of children: Not on file   Years of education: Not on file   Highest education level: Not on file  Occupational History   Not on file  Tobacco Use   Smoking status: Never   Smokeless tobacco: Never  Vaping Use    Vaping Use: Never used  Substance and Sexual Activity   Alcohol use: No   Drug use: No   Sexual activity: Not on file  Other Topics Concern   Not on file  Social History Narrative   Caffeine: 1 cup coffee/day   Lives with husband, 3 sons   Occupation: works for [employer], started going back to school   Activity: going to gym 3x/wk   Diet: good water, fruits and vegetables daily   Social Determinants of Radio broadcast assistant Strain: Not on file  Food Insecurity: Not on file  Transportation Needs: Not on file  Physical Activity: Not on file  Stress: Not on file  Social Connections: Not on file  Intimate Partner Violence: Not on file    FAMILY HISTORY: Family History  Problem Relation Age of Onset   Diabetes Mother    Hypertension Mother    Diabetes Father    Hypertension Father    Cancer Paternal Aunt 68       breast   Stroke Neg Hx    Coronary artery disease Neg Hx    Colon cancer Neg Hx    Esophageal cancer Neg Hx    Rectal cancer Neg Hx    Stomach cancer Neg Hx     ALLERGIES:  has No Known Allergies.  MEDICATIONS:  Current Outpatient Medications  Medication Sig Dispense Refill   amLODipine (NORVASC) 5 MG tablet Take 1 tablet (5 mg total) by mouth in the morning and at bedtime. 180 tablet 1   ascorbic acid (VITAMIN C) 500 MG tablet Take 500 mg by mouth daily.     Cholecalciferol (VITAMIN D3) 25 MCG (1000 UT) CAPS Take 1 capsule (1,000 Units total) by mouth daily. 30 capsule    fluticasone (FLONASE) 50 MCG/ACT nasal spray Use 2 spray(s) in each nostril once daily 48 g 0   levocetirizine (XYZAL) 5 MG tablet TAKE 1 TABLET BY MOUTH ONCE DAILY IN THE EVENING 90 tablet 0   Multiple Vitamin (MULTIVITAMIN ADULT) TABS Take 1 tablet by mouth daily.     No current facility-administered medications for this visit.    REVIEW OF SYSTEMS:   Constitutional: ( - ) fevers, ( - )  chills , ( - ) night sweats Eyes: ( - ) blurriness of vision, ( - ) double vision, ( -  ) watery eyes Ears, nose, mouth, throat, and face: ( - ) mucositis, ( - ) sore throat Respiratory: ( - ) cough, ( - ) dyspnea, ( - ) wheezes Cardiovascular: ( - ) palpitation, ( - ) chest discomfort, ( - ) lower extremity swelling Gastrointestinal:  ( - ) nausea, ( - ) heartburn, ( - ) change in bowel habits Skin: ( - ) abnormal skin rashes Lymphatics: ( - ) new lymphadenopathy, ( - ) easy bruising Neurological: ( - ) numbness, ( - ) tingling, ( - ) new weaknesses Behavioral/Psych: ( - ) mood change, ( - )  new changes  All other systems were reviewed with the patient and are negative.  PHYSICAL EXAMINATION:  Vitals:   01/22/22 0924  BP: (!) 173/91  Pulse: 72  Resp: 15  Temp: 97.9 F (36.6 C)  SpO2: 100%   Filed Weights   01/22/22 0924  Weight: 197 lb 8 oz (89.6 kg)    GENERAL: well appearing middle-aged African-American female in NAD  SKIN: skin color, texture, turgor are normal, no rashes or significant lesions EYES: conjunctiva are pink and non-injected, sclera clear LUNGS: clear to auscultation and percussion with normal breathing effort HEART: regular rate & rhythm and no murmurs and no lower extremity edema Musculoskeletal: no cyanosis of digits and no clubbing  PSYCH: alert & oriented x 3, fluent speech NEURO: no focal motor/sensory deficits  LABORATORY DATA:  I have reviewed the data as listed    Latest Ref Rng & Units 12/23/2021    8:27 AM 05/13/2021    8:58 AM 12/18/2020    7:46 AM  CBC  WBC 4.0 - 10.5 K/uL 4.0  4.1  3.7   Hemoglobin 12.0 - 15.0 g/dL 12.9  13.0  13.4   Hematocrit 36.0 - 46.0 % 38.9  39.4  40.2   Platelets 150.0 - 400.0 K/uL 220.0  218.0  231.0        Latest Ref Rng & Units 12/23/2021    8:27 AM 05/13/2021    8:58 AM 12/19/2020   12:02 PM  CMP  Glucose 70 - 99 mg/dL 80  80    BUN 6 - 23 mg/dL 17  19    Creatinine 0.40 - 1.20 mg/dL 1.37  1.46    Sodium 135 - 145 mEq/L 135  135    Potassium 3.5 - 5.1 mEq/L 4.1  3.8    Chloride 96 - 112  mEq/L 95  98    CO2 19 - 32 mEq/L 30  29    Calcium 8.4 - 10.5 mg/dL 9.2  9.5    Total Protein 6.0 - 8.3 g/dL 7.4   CANCELED   Total Bilirubin 0.2 - 1.2 mg/dL 0.7     Alkaline Phos 39 - 117 U/L 52     AST 0 - 37 U/L 19     ALT 0 - 35 U/L 13        ASSESSMENT & PLAN [name] 56 y.o. female with medical history significant for uterine fibroids, hypertension, fatty liver disease, and dermatofibrosarcoma protuberans who presents for evaluation of a relative lymphocytosis with mild neutropenia.  After review of the labs, review of the records, and discussion with the patient the patients findings are most consistent with a chronic benign neutropenia.   The differential for neutropenia includes nutritional deficiency, inflammatory disorder, medication side effect, infectious etiology, or congenital condition (such as benign ethnic neutropenia). The patient is not taking any medications known to cause neutropenia. Workup for this condition includes viral serologies (HIV, Hep B and Hep C),  vitamin b12/folate, and inflammatory markers with ESR and CRP.    A common cause for low WBC is benign ethnic neutropenia (BEN). This is a condition whereby individuals of African or Mediterranean descent have lower WBC than the general population. The condition typically has an absolute neutrophil count (ANC) between 1000-1500 with no recurrent infections. Patients with this condition have normal functioning immune systems and have no consequences as a result of their low ANC. BEN is a diagnosis of exclusion, so it would require the full above workup to make.    #  Neutropenia, likely Benign Ethnic Neutropenia  --repeat CBC and CMP  --infectious serology testing with Hep B, Hep C, and HIV  --nutritional evaluation with Vitamin b12, folate  --inflammatory workup with ESR and CRP  --RTC for any worsening neutropenia or concerning abnormalities in the above labs.   No orders of the defined types were placed  in this encounter.   All questions were answered. The patient knows to call the clinic with any problems, questions or concerns.  A total of more than 60 minutes were spent on this encounter with face-to-face time and non-face-to-face time, including preparing to see the patient, ordering tests and/or medications, counseling the patient and coordination of care as outlined above.   Ledell Peoples, MD Department of Hematology/Oncology [place] [facility] Phone: [phone] Pager: [phone] Email: [email or web address]  01/22/2022 10:10 AM

## 2022-01-28 ENCOUNTER — Encounter: Payer: Federal, State, Local not specified - PPO | Admitting: Internal Medicine

## 2022-01-28 ENCOUNTER — Telehealth: Payer: Self-pay | Admitting: *Deleted

## 2022-01-28 ENCOUNTER — Other Ambulatory Visit: Payer: Federal, State, Local not specified - PPO

## 2022-01-28 NOTE — Telephone Encounter (Signed)
TCT patient regarding recent lab results. Advised that Dr. Lorenso Courier saw no concerning abnormalities in her blood. He states her labs are consistent with Benign Ethnic Neutropenia  Advised that her WBC are adequate to fight off any infection. No need to follow up in this clinic. Pt voiced understanding.

## 2022-01-28 NOTE — Telephone Encounter (Signed)
-----   Message from Orson Slick, MD sent at 01/28/2022 12:53 PM EDT ----- Please let [name] know that there were no concerning abnormalities in her labs.  At this time her findings appear most consistent with a benign ethnic neutropenia.  She may have low white blood cells but her body should be able to fight off infection without any difficulty.  At this time I recommend follow-up on an as-needed basis.  If she would develop any new or worsening hematological abnormalities or recurrent infections we would happily see her back in clinic.   ----- Message ----- From: Interface, Lab In [place] Sent: 01/22/2022  10:35 AM EDT To: Orson Slick, MD

## 2022-01-29 DIAGNOSIS — N1831 Chronic kidney disease, stage 3a: Secondary | ICD-10-CM | POA: Diagnosis not present

## 2022-01-29 DIAGNOSIS — N130 Hydronephrosis with ureteropelvic junction obstruction: Secondary | ICD-10-CM | POA: Diagnosis not present

## 2022-02-17 DIAGNOSIS — N1339 Other hydronephrosis: Secondary | ICD-10-CM | POA: Diagnosis not present

## 2022-02-26 DIAGNOSIS — N130 Hydronephrosis with ureteropelvic junction obstruction: Secondary | ICD-10-CM | POA: Diagnosis not present

## 2022-02-27 ENCOUNTER — Other Ambulatory Visit: Payer: Self-pay | Admitting: Urology

## 2022-02-27 DIAGNOSIS — R1900 Intra-abdominal and pelvic swelling, mass and lump, unspecified site: Secondary | ICD-10-CM

## 2022-03-09 DIAGNOSIS — Z1231 Encounter for screening mammogram for malignant neoplasm of breast: Secondary | ICD-10-CM | POA: Diagnosis not present

## 2022-03-09 DIAGNOSIS — Z01419 Encounter for gynecological examination (general) (routine) without abnormal findings: Secondary | ICD-10-CM | POA: Diagnosis not present

## 2022-03-19 ENCOUNTER — Ambulatory Visit
Admission: RE | Admit: 2022-03-19 | Discharge: 2022-03-19 | Disposition: A | Discharge status: Home / Self Care | Payer: Federal, State, Local not specified - PPO | Source: Ambulatory Visit | Attending: Urology | Admitting: Urology

## 2022-03-19 DIAGNOSIS — R1900 Intra-abdominal and pelvic swelling, mass and lump, unspecified site: Secondary | ICD-10-CM

## 2022-03-19 DIAGNOSIS — K7689 Other specified diseases of liver: Secondary | ICD-10-CM | POA: Diagnosis not present

## 2022-03-19 MED ORDER — GADOPICLENOL 0.5 MMOL/ML IV SOLN
8.0000 mL | Freq: Once | INTRAVENOUS | Status: AC | PRN
  Administered 2022-03-19: 8 mL via INTRAVENOUS

## 2022-04-02 ENCOUNTER — Other Ambulatory Visit: Payer: Self-pay | Admitting: Urology

## 2022-04-16 ENCOUNTER — Encounter (HOSPITAL_BASED_OUTPATIENT_CLINIC_OR_DEPARTMENT_OTHER): Payer: Self-pay | Admitting: Urology

## 2022-04-16 NOTE — Progress Notes (Signed)
Spoke w/ via phone for pre-op interview--- [name]----   EKG            Lab results------ COVID test -----patient states asymptomatic no test needed Arrive at -------0700 NPO after MN NO Solid Food.   Med rec completed Medications to take morning of surgery -----Norvasc Diabetic medication ----- Patient instructed no nail polish to be worn day of surgery Patient instructed to bring photo id and insurance card day of surgery Patient aware to have Driver (ride ) / caregiver Husband [name]   for 24 hours after surgery  Patient Special Instructions ----- Pre-Op special Istructions ----- Patient verbalized understanding of instructions that were given at this phone interview. Patient denies shortness of breath, chest pain, fever, cough at this phone interview.

## 2022-04-20 ENCOUNTER — Ambulatory Visit (HOSPITAL_BASED_OUTPATIENT_CLINIC_OR_DEPARTMENT_OTHER): Payer: Federal, State, Local not specified - PPO | Admitting: Anesthesiology

## 2022-04-20 ENCOUNTER — Ambulatory Visit (HOSPITAL_BASED_OUTPATIENT_CLINIC_OR_DEPARTMENT_OTHER)
Admission: RE | Admit: 2022-04-20 | Discharge: 2022-04-20 | Disposition: A | Discharge status: Home / Self Care | Payer: Federal, State, Local not specified - PPO | Attending: Urology | Admitting: Urology

## 2022-04-20 ENCOUNTER — Encounter (HOSPITAL_BASED_OUTPATIENT_CLINIC_OR_DEPARTMENT_OTHER): Payer: Self-pay | Admitting: Urology

## 2022-04-20 ENCOUNTER — Encounter (HOSPITAL_BASED_OUTPATIENT_CLINIC_OR_DEPARTMENT_OTHER)
Admission: RE | Disposition: A | Discharge status: Home / Self Care | Payer: Self-pay | Source: Home / Self Care | Attending: Urology

## 2022-04-20 DIAGNOSIS — N1330 Unspecified hydronephrosis: Secondary | ICD-10-CM | POA: Diagnosis not present

## 2022-04-20 DIAGNOSIS — N289 Disorder of kidney and ureter, unspecified: Secondary | ICD-10-CM | POA: Diagnosis not present

## 2022-04-20 DIAGNOSIS — N189 Chronic kidney disease, unspecified: Secondary | ICD-10-CM | POA: Diagnosis not present

## 2022-04-20 DIAGNOSIS — I129 Hypertensive chronic kidney disease with stage 1 through stage 4 chronic kidney disease, or unspecified chronic kidney disease: Secondary | ICD-10-CM | POA: Diagnosis not present

## 2022-04-20 DIAGNOSIS — N1339 Other hydronephrosis: Secondary | ICD-10-CM | POA: Diagnosis not present

## 2022-04-20 DIAGNOSIS — I10 Essential (primary) hypertension: Secondary | ICD-10-CM | POA: Diagnosis not present

## 2022-04-20 HISTORY — PX: CYSTOSCOPY WITH RETROGRADE PYELOGRAM, URETEROSCOPY AND STENT PLACEMENT: SHX5789

## 2022-04-20 HISTORY — DX: Chronic kidney disease, unspecified: N18.9

## 2022-04-20 SURGERY — CYSTOURETEROSCOPY, WITH RETROGRADE PYELOGRAM AND STENT INSERTION
Anesthesia: General | Laterality: Bilateral

## 2022-04-20 MED ORDER — SODIUM CHLORIDE 0.9 % IV SOLN: INTRAVENOUS | Status: DC

## 2022-04-20 MED ORDER — IOHEXOL 300 MG/ML  SOLN
INTRAMUSCULAR | Status: DC | PRN
  Administered 2022-04-20: 20 mL via URETHRAL

## 2022-04-20 MED ORDER — MIDAZOLAM HCL 2 MG/2ML IJ SOLN
INTRAMUSCULAR | Status: DC | PRN
  Administered 2022-04-20: 2 mg via INTRAVENOUS

## 2022-04-20 MED ORDER — EPHEDRINE 5 MG/ML INJ
INTRAVENOUS | Status: AC
  Filled 2022-04-20: qty 5

## 2022-04-20 MED ORDER — PROPOFOL 10 MG/ML IV BOLUS
INTRAVENOUS | Status: DC | PRN
  Administered 2022-04-20: 160 mg via INTRAVENOUS

## 2022-04-20 MED ORDER — SODIUM CHLORIDE 0.9 % IR SOLN
Status: DC | PRN
  Administered 2022-04-20: 3000 mL via INTRAVESICAL

## 2022-04-20 MED ORDER — MIDAZOLAM HCL 2 MG/2ML IJ SOLN
INTRAMUSCULAR | Status: AC
  Filled 2022-04-20: qty 2

## 2022-04-20 MED ORDER — LIDOCAINE HCL (PF) 2 % IJ SOLN
INTRAMUSCULAR | Status: AC
  Filled 2022-04-20: qty 5

## 2022-04-20 MED ORDER — ONDANSETRON HCL 4 MG/2ML IJ SOLN
INTRAMUSCULAR | Status: DC | PRN
  Administered 2022-04-20: 4 mg via INTRAVENOUS

## 2022-04-20 MED ORDER — LIDOCAINE 2% (20 MG/ML) 5 ML SYRINGE
INTRAMUSCULAR | Status: DC | PRN
  Administered 2022-04-20: 80 mg via INTRAVENOUS

## 2022-04-20 MED ORDER — FENTANYL CITRATE (PF) 100 MCG/2ML IJ SOLN
INTRAMUSCULAR | Status: DC | PRN
  Administered 2022-04-20: 50 ug via INTRAVENOUS

## 2022-04-20 MED ORDER — CEFAZOLIN SODIUM-DEXTROSE 2-4 GM/100ML-% IV SOLN
INTRAVENOUS | Status: AC
  Filled 2022-04-20: qty 100

## 2022-04-20 MED ORDER — HYDROMORPHONE HCL 1 MG/ML IJ SOLN: 0.2500 mg | INTRAMUSCULAR | Status: DC | PRN

## 2022-04-20 MED ORDER — EPHEDRINE SULFATE-NACL 50-0.9 MG/10ML-% IV SOSY
PREFILLED_SYRINGE | INTRAVENOUS | Status: DC | PRN
  Administered 2022-04-20: 15 mg via INTRAVENOUS
  Administered 2022-04-20: 10 mg via INTRAVENOUS

## 2022-04-20 MED ORDER — FENTANYL CITRATE (PF) 100 MCG/2ML IJ SOLN
INTRAMUSCULAR | Status: AC
  Filled 2022-04-20: qty 2

## 2022-04-20 MED ORDER — DEXAMETHASONE SODIUM PHOSPHATE 10 MG/ML IJ SOLN
INTRAMUSCULAR | Status: AC
  Filled 2022-04-20: qty 1

## 2022-04-20 MED ORDER — HYDROCODONE-ACETAMINOPHEN 5-325 MG PO TABS: 1.0000 | ORAL_TABLET | ORAL | 0 refills | Status: DC | PRN

## 2022-04-20 MED ORDER — ONDANSETRON HCL 4 MG/2ML IJ SOLN
INTRAMUSCULAR | Status: AC
  Filled 2022-04-20: qty 2

## 2022-04-20 MED ORDER — DEXAMETHASONE SODIUM PHOSPHATE 10 MG/ML IJ SOLN
INTRAMUSCULAR | Status: DC | PRN
  Administered 2022-04-20 (×2): 5 mg via INTRAVENOUS

## 2022-04-20 MED ORDER — CEFAZOLIN SODIUM-DEXTROSE 2-4 GM/100ML-% IV SOLN
2.0000 g | INTRAVENOUS | Status: AC
  Administered 2022-04-20: 2 g via INTRAVENOUS

## 2022-04-20 MED ORDER — LACTATED RINGERS IV SOLN: INTRAVENOUS | Status: DC

## 2022-04-20 SURGICAL SUPPLY — 18 items
BAG DRAIN URO-CYSTO SKYTR STRL (DRAIN) ×1 IMPLANT
BAG DRN UROCATH (DRAIN) ×1
CATH URETL OPEN END 6FR 70 (CATHETERS) IMPLANT
CLOTH BEACON ORANGE TIMEOUT ST (SAFETY) ×1 IMPLANT
FIBER LASER FLEXIVA 365 (UROLOGICAL SUPPLIES) IMPLANT
GLOVE BIO SURGEON STRL SZ7.5 (GLOVE) ×1 IMPLANT
GLOVE BIOGEL PI IND STRL 6.5 (GLOVE) IMPLANT
GOWN STRL REUS W/TWL XL LVL3 (GOWN DISPOSABLE) IMPLANT
GUIDEWIRE STR DUAL SENSOR (WIRE) ×1 IMPLANT
IV NS IRRIG 3000ML ARTHROMATIC (IV SOLUTION) ×1 IMPLANT
KIT TURNOVER CYSTO (KITS) ×1 IMPLANT
MANIFOLD NEPTUNE II (INSTRUMENTS) ×1 IMPLANT
NS IRRIG 500ML POUR BTL (IV SOLUTION) ×1 IMPLANT
PACK CYSTO (CUSTOM PROCEDURE TRAY) ×1 IMPLANT
TRACTIP FLEXIVA PULS ID 200XHI (Laser) IMPLANT
TRACTIP FLEXIVA PULSE ID 200 (Laser)
TUBE CONNECTING 12X1/4 (SUCTIONS) IMPLANT
TUBING UROLOGY SET (TUBING) IMPLANT

## 2022-04-20 NOTE — Anesthesia Procedure Notes (Signed)
Procedure Name: LMA Insertion Date/Time: 04/20/2022 9:46 AM  Performed by: Mechele Claude, CRNAPre-anesthesia Checklist: Patient identified, Emergency Drugs available, Suction available and Patient being monitored Patient Re-evaluated:Patient Re-evaluated prior to induction Oxygen Delivery Method: Circle system utilized Preoxygenation: Pre-oxygenation with 100% oxygen Induction Type: IV induction Ventilation: Mask ventilation without difficulty LMA: LMA inserted LMA Size: 4.0 Number of attempts: 1 Airway Equipment and Method: Bite block Placement Confirmation: positive ETCO2 Tube secured with: Tape Dental Injury: Teeth and Oropharynx as per pre-operative assessment

## 2022-04-20 NOTE — Transfer of Care (Signed)
Immediate Anesthesia Transfer of Care NoteImmediate Anesthesia Transfer of Care Note  Patient: [name]  Procedure(s) Performed: Procedure(s) (LRB): CYSTOSCOPY WITH BILATERAL RETROGRADE PYELOGRAM, LEFT URETEROSCOPY AND STENT PLACEMENT (Bilateral)  Patient Location: PACU  Anesthesia Type: General  Level of Consciousness: awake, alert  and oriented  Airway & Oxygen Therapy: Patient Spontanous Breathing and Patient connected to nasal cannula oxygen  Post-op Assessment: Report given to PACU RN and Post -op Vital signs reviewed and stable  Post vital signs: Reviewed and stable  Complications: No apparent anesthesia complicationsLast Vitals:  Vitals Value Taken Time  BP 133/107 04/20/22 1030  Temp 36.3 C 04/20/22 1022  Pulse 93 04/20/22 1030  Resp 16 04/20/22 1030  SpO2 95 % 04/20/22 1030  Vitals shown include unvalidated device data.  Last Pain:  Vitals:   04/20/22 1022  TempSrc:   PainSc: 0-No pain      Patients Stated Pain Goal: 5 (95/32/02 3343)  Complications: No notable events documented.

## 2022-04-20 NOTE — Anesthesia Preprocedure Evaluation (Signed)
Anesthesia Evaluation  Patient identified by MRN, date of birth, ID band Patient awake    Reviewed: Allergy & Precautions, NPO status , Patient's Chart, lab work & pertinent test results  Airway Mallampati: II       Dental   Pulmonary neg pulmonary ROS   breath sounds clear to auscultation       Cardiovascular hypertension,  Rhythm:Regular Rate:Normal     Neuro/Psych negative neurological ROS     GI/Hepatic negative GI ROS, Neg liver ROS,,,  Endo/Other    Renal/GU Renal disease     Musculoskeletal   Abdominal   Peds  Hematology   Anesthesia Other Findings   Reproductive/Obstetrics                             Anesthesia Physical Anesthesia Plan  ASA: 3  Anesthesia Plan: General   Post-op Pain Management:    Induction: Intravenous  PONV Risk Score and Plan: 3 and Ondansetron, Dexamethasone and Midazolam  Airway Management Planned: LMA  Additional Equipment:   Intra-op Plan:   Post-operative Plan: Extubation in OR  Informed Consent: I have reviewed the patients History and Physical, chart, labs and discussed the procedure including the risks, benefits and alternatives for the proposed anesthesia with the patient or authorized representative who has indicated his/her understanding and acceptance.     Dental advisory given  Plan Discussed with: CRNA and Anesthesiologist  Anesthesia Plan Comments:        Anesthesia Quick Evaluation

## 2022-04-20 NOTE — Anesthesia Postprocedure Evaluation (Signed)
Anesthesia Post Note  Patient: [name]  Procedure(s) Performed: CYSTOSCOPY WITH BILATERAL RETROGRADE PYELOGRAM, LEFT URETEROSCOPY AND STENT PLACEMENT (Bilateral)     Patient location during evaluation: PACU Anesthesia Type: General Level of consciousness: awake Pain management: pain level controlled Vital Signs Assessment: post-procedure vital signs reviewed and stable Respiratory status: spontaneous breathing Cardiovascular status: stable Postop Assessment: no apparent nausea or vomiting Anesthetic complications: no   No notable events documented.  Last Vitals:  Vitals:   04/20/22 1022 04/20/22 1030  BP: (!) 152/90 (!) 154/96  Pulse: 95 88  Resp: 14 13  Temp: (!) 36.3 C   SpO2: 98% 95%    Last Pain:  Vitals:   04/20/22 1022  TempSrc:   PainSc: 0-No pain                 Tejas Seawood

## 2022-04-20 NOTE — H&P (Signed)
H&P  Chief Complaint: Left hydronephrosis  History of Present Illness: 56 year old female with left-sided hydronephrosis that was evaluated with a CT scan of the abdomen pelvis followed by MRI of the abdomen.  This revealed a left larger than right periureteral soft tissue mass causing chronic left-sided hydronephrosis.  She presents for bilateral retrograde pyelogram, left diagnostic ureteroscopy with possible biopsy, left ureteral stent placement  Past Medical History:  Diagnosis Date   Allergy    Chronic kidney disease    COVID-19 virus infection 06/2019   Dermatofibrosarcoma protuberans 2014   Ectatic abdominal aorta ([place]) 01/2015   rpt [place] 5 yrs   Fatty liver 01/2015   by [place]   Gallbladder polyp 01/2015   56m by [place]  Hypertension    Iron deficiency    Seasonal allergies    Uterine fibroid 01/2015   markedly enlarged   Past Surgical History:  Procedure Laterality Date   COLONOSCOPY  10/2019   benign polyp, rpt 10 yrs ([place])   ENDOMETRIAL ABLATION  05/04/2008   fibroids   HYSTERECTOMY ABDOMINAL WITH SALPINGECTOMY Bilateral 07/22/2017   Procedure: HYSTERECTOMY ABDOMINAL WITH SALPINGECTOMY;  Surgeon: HMolli Posey MD;  [facility];  Service: Gynecology;  Laterality: Bilateral;   MASS EXCISION     lower abdomen dermatofibrosarcoma (CCS)   OOPHORECTOMY Right 07/22/2017   Procedure: OOPHORECTOMY;  Surgeon: HMolli Posey MD;  [facility];  Service: Gynecology;  Laterality: Right;   TUBAL LIGATION  05/05/1995    Home Medications:  Medications Prior to Admission  Medication Sig Dispense Refill Last Dose   amLODipine (NORVASC) 5 MG tablet TAKE 1 TABLET BY MOUTH IN THE MORNING AND AT BEDTIME 180 tablet 2 04/20/2022 at 0610   ascorbic acid (VITAMIN C) 500 MG tablet Take 500 mg by mouth daily.   04/19/2022   Cholecalciferol (VITAMIN D3) 25 MCG (1000 UT) CAPS Take 1 capsule (1,000 Units total) by mouth daily. 30 capsule  04/19/2022   Multiple Vitamin  (MULTIVITAMIN ADULT) TABS Take 1 tablet by mouth daily.   04/19/2022   fluticasone (FLONASE) 50 MCG/ACT nasal spray Use 2 spray(s) in each nostril once daily 48 g 0 More than a month   levocetirizine (XYZAL) 5 MG tablet TAKE 1 TABLET BY MOUTH ONCE DAILY IN THE EVENING 90 tablet 0 More than a month   Allergies: No Known Allergies  Family History  Problem Relation Age of Onset   Diabetes Mother    Hypertension Mother    Diabetes Father    Hypertension Father    Cancer Paternal Aunt 680      breast   Stroke Neg Hx    Coronary artery disease Neg Hx    Colon cancer Neg Hx    Esophageal cancer Neg Hx    Rectal cancer Neg Hx    Stomach cancer Neg Hx    Social History:  reports that she has never smoked. She has never used smokeless tobacco. She reports that she does not drink alcohol and does not use drugs.  ROS: A complete review of systems was performed.  All systems are negative except for pertinent findings as noted. ROS   Physical Exam:  Vital signs in last 24 hours: Temp:  [97.6 F (36.4 C)] 97.6 F (36.4 C) (12/18 0726) Pulse Rate:  [90] 90 (12/18 0726) Resp:  [16] 16 (12/18 0726) BP: (159)/(98) 159/98 (12/18 0726) SpO2:  [99 %] 99 % (12/18 0726) Weight:  [90.4 kg] 90.4 kg (12/18 0726) General:  Alert and  oriented, No acute distress HEENT: Normocephalic, atraumatic Neck: No JVD or lymphadenopathy Cardiovascular: Regular rate and rhythm Lungs: Regular rate and effort Abdomen: Soft, nontender, nondistended, no abdominal masses Back: No CVA tenderness Extremities: No edema Neurologic: Grossly intact  Laboratory Data:  No results found for this or any previous visit (from the past 24 hour(s)). No results found for this or any previous visit (from the past 240 hour(s)). Creatinine: No results for input(s): "CREATININE" in the last 168 hours.  Impression/Assessment:  Left hydronephrosis secondary to extrinsic mass  Plan:  Proceed with cystoscopy bilateral  retrograde pyelogram, left diagnostic ureteroscopy with possible biopsy, ureteral stent placement  Marton Redwood, III 04/20/2022, 8:57 AM

## 2022-04-20 NOTE — Discharge Instructions (Addendum)
[facility] [phone] Post Ureteroscopy With or Without Stent Instructions  Definitions:  Ureter: The duct that transports urine from the kidney to the bladder. Stent:   A plastic hollow tube that is placed into the ureter, from the kidney to the                 bladder to prevent the ureter from swelling shut.  GENERAL INSTRUCTIONS:  Despite the fact that no skin incisions were used, the area around the ureter and bladder is raw and irritated. The stent is a foreign body which will further irritate the bladder wall. This irritation is manifested by increased frequency of urination, both day and night, and by an increase in the urge to urinate. In some, the urge to urinate is present almost always. Sometimes the urge is strong enough that you may not be able to stop yourself from urinating. The only real cure is to remove the stent and then give time for the bladder wall to heal which can't be done until the danger of the ureter swelling shut has passed, which varies.  You may see some blood in your urine while the stent is in place and a few days afterwards. Do not be alarmed, even if the urine was clear for a while. Get off your feet and drink lots of fluids until clearing occurs. If you start to pass clots or don't improve, call us.  DIET: You may return to your normal diet immediately. Because of the raw surface of your bladder, alcohol, spicy foods, acid type foods and drinks with caffeine may cause irritation or frequency and should be used in moderation. To keep your urine flowing freely and to avoid constipation, drink plenty of fluids during the day ( 8-10 glasses ). Tip: Avoid cranberry juice because it is very acidic.  ACTIVITY: Your physical activity doesn't need to be restricted. However, if you are very active, you may see some blood in your urine. We suggest that you reduce your activity under these circumstances until the bleeding has stopped.  BOWELS: It is  important to keep your bowels regular during the postoperative period. Straining with bowel movements can cause bleeding. A bowel movement every other day is reasonable. Use a mild laxative if needed, such as Milk of Magnesia 2-3 tablespoons, or 2 Dulcolax tablets. Call if you continue to have problems. If you have been taking narcotics for pain, before, during or after your surgery, you may be constipated. Take a laxative if necessary.   MEDICATION: You should resume your pre-surgery medications unless told not to. You may take oxybutynin or flomax if prescribed for bladder spasms or discomfort from the stent Take pain medication as directed for pain refractory to conservative management  PROBLEMS YOU SHOULD REPORT TO [place]: Fevers over 100.5 Fahrenheit. Heavy bleeding, or clots ( See above notes about blood in urine ). Inability to urinate. Drug reactions ( hives, rash, nausea, vomiting, diarrhea ). Severe burning or pain with urination that is not improving.   Post Anesthesia Home Care Instructions  Activity: Get plenty of rest for the remainder of the day. A responsible individual must stay with you for 24 hours following the procedure.  For the next 24 hours, DO NOT: -Drive a car -Paediatric nurse -Drink alcoholic beverages -Take any medication unless instructed by your physician -Make any legal decisions or sign important papers.  Meals: Start with liquid foods such as gelatin or soup. Progress to regular foods as tolerated. Avoid greasy, spicy,  heavy foods. If nausea and/or vomiting occur, drink only clear liquids until the nausea and/or vomiting subsides. Call your physician if vomiting continues.  Special Instructions/Symptoms: Your throat may feel dry or sore from the anesthesia or the breathing tube placed in your throat during surgery. If this causes discomfort, gargle with warm salt water. The discomfort should disappear within 24 hours.

## 2022-04-20 NOTE — Op Note (Signed)
Operative Note  Preoperative diagnosis:  1.  Left hydronephrosis  Postoperative diagnosis: 1.  Left hydronephrosis  Procedure(s): 1.  Cystoscopy with bilateral retrograde pyelogram, left diagnostic ureteroscopy, ureteral stent placement on the left  Surgeon: Link Snuffer, MD  Assistants: None  Anesthesia: General  Complications: None immediate  EBL: Minimal  Specimens: 1.  None  Drains/Catheters: 1.  6 x 24 double-J ureteral stent  Intraoperative findings: 1.  Normal urethra and bladder 2.  Right retrograde pyelogram revealed initially what appeared to be a filling defect in the right upper pole.  With continuous fluoroscopy however showed no filling defect.  Otherwise no abnormality on the right. 3.  Left retrograde pyelogram revealed some narrowing of the proximal ureter with upstream hydronephrosis consistent with extrinsic compression. 4.  Diagnostic ureteroscopy revealed no tumors that were intraluminal.  No obvious ureteral involvement.  Indication: 56 year old female with left-sided hydronephrosis underwent CT scan as well as MRI that showed a soft tissue mass compressing the left ureter.  She presents for the previously mentioned operation.  Description of procedure:  The patient was identified and consent was obtained.  The patient was taken to the operating room and placed in the supine position.  The patient was placed under general anesthesia.  Perioperative antibiotics were administered.  The patient was placed in dorsal lithotomy.  Patient was prepped and draped in a standard sterile fashion and a timeout was performed.  A 21 French rigid cystoscope was advanced into the urethra and into the bladder.  Complete cystoscopy was performed with no abnormal findings.  The right ureter was cannulated with an open-ended ureteral catheter and a retrograde pyelogram was performed with findings noted above.  The same was performed on the left.  A sensor wire was then advanced  up the left ureter and into the kidney under fluoroscopic guidance.  A semirigid ureteroscope was advanced alongside the wire and this was able to be passed up all the way to the renal pelvis.  I inspected the entire ureter and there was no evidence of ureteral tumor or any invasion of tumor into the lumen.  There was signs of compression in the proximal ureter.  I withdrew the scope and backloaded the wire onto the rigid cystoscope followed by routine placement of a 6 x 24 double-J ureteral stent.  Fluoroscopy confirmed proximal placement and direct visualization confirmed a good coil within the bladder.  I drained the bladder and withdrew the scope.  Patient tolerated the procedure well was stable postoperative.  Plan: Will get her set up with interventional radiology to try to obtain a percutaneous biopsy.

## 2022-04-21 ENCOUNTER — Encounter (HOSPITAL_BASED_OUTPATIENT_CLINIC_OR_DEPARTMENT_OTHER): Payer: Self-pay | Admitting: Urology

## 2022-04-29 ENCOUNTER — Other Ambulatory Visit (HOSPITAL_COMMUNITY): Payer: Self-pay | Admitting: Urology

## 2022-04-29 DIAGNOSIS — N1330 Unspecified hydronephrosis: Secondary | ICD-10-CM

## 2022-04-29 DIAGNOSIS — D369 Benign neoplasm, unspecified site: Secondary | ICD-10-CM

## 2022-04-29 DIAGNOSIS — D1730 Benign lipomatous neoplasm of skin and subcutaneous tissue of unspecified sites: Secondary | ICD-10-CM

## 2022-04-29 NOTE — Progress Notes (Signed)
Victoria Keen, MD  Lucas Mallow, MD; Riley Lam Ok to proceed with CT bx right periureteral small mass  TS

## 2022-04-29 NOTE — Progress Notes (Signed)
Lucas Mallow, MD  Greggory Keen, MD; Donita Brooks D Thanks. Whatever you can biopsy. I had talked to [facility] about it and reviewed the images with him too (went back and forth several times from the beginning and ultimately tried cysto/ureteroscopy and stent placement to protect ureter--ureteral lumen was clean) but whatever you all think can be biopsied safely.  Thanks for reviewing with me. I think if it is not diagnostic or safe to biopsy then could maybe perform robotic excisional biopsy. If the right can be biopsied then can start there though. Presumably its the same process going on in both lesions.

## 2022-05-07 ENCOUNTER — Other Ambulatory Visit: Payer: Self-pay | Admitting: Radiology

## 2022-05-07 DIAGNOSIS — N1330 Unspecified hydronephrosis: Secondary | ICD-10-CM

## 2022-05-08 ENCOUNTER — Other Ambulatory Visit: Payer: Self-pay | Admitting: Internal Medicine

## 2022-05-08 ENCOUNTER — Other Ambulatory Visit: Payer: Self-pay | Admitting: Student

## 2022-05-08 ENCOUNTER — Ambulatory Visit (HOSPITAL_COMMUNITY): Payer: Federal, State, Local not specified - PPO

## 2022-05-10 ENCOUNTER — Encounter: Payer: Self-pay | Admitting: Family Medicine

## 2022-05-10 DIAGNOSIS — N2889 Other specified disorders of kidney and ureter: Secondary | ICD-10-CM | POA: Insufficient documentation

## 2022-05-10 DIAGNOSIS — M7989 Other specified soft tissue disorders: Secondary | ICD-10-CM | POA: Insufficient documentation

## 2022-05-11 ENCOUNTER — Encounter (HOSPITAL_COMMUNITY): Payer: Self-pay

## 2022-05-11 ENCOUNTER — Ambulatory Visit (HOSPITAL_COMMUNITY)
Admission: RE | Admit: 2022-05-11 | Discharge: 2022-05-11 | Disposition: A | Discharge status: Home / Self Care | Payer: Federal, State, Local not specified - PPO | Source: Ambulatory Visit | Attending: Urology | Admitting: Urology

## 2022-05-11 ENCOUNTER — Other Ambulatory Visit: Payer: Self-pay

## 2022-05-11 DIAGNOSIS — D369 Benign neoplasm, unspecified site: Secondary | ICD-10-CM | POA: Insufficient documentation

## 2022-05-11 DIAGNOSIS — Z8616 Personal history of COVID-19: Secondary | ICD-10-CM | POA: Insufficient documentation

## 2022-05-11 DIAGNOSIS — N1330 Unspecified hydronephrosis: Secondary | ICD-10-CM | POA: Insufficient documentation

## 2022-05-11 DIAGNOSIS — N189 Chronic kidney disease, unspecified: Secondary | ICD-10-CM | POA: Insufficient documentation

## 2022-05-11 DIAGNOSIS — I129 Hypertensive chronic kidney disease with stage 1 through stage 4 chronic kidney disease, or unspecified chronic kidney disease: Secondary | ICD-10-CM | POA: Insufficient documentation

## 2022-05-11 DIAGNOSIS — N1339 Other hydronephrosis: Secondary | ICD-10-CM | POA: Diagnosis not present

## 2022-05-11 DIAGNOSIS — R590 Localized enlarged lymph nodes: Secondary | ICD-10-CM | POA: Diagnosis not present

## 2022-05-11 LAB — CBC
HCT: 38.9 % (ref 36.0–46.0)
Hemoglobin: 13.4 g/dL (ref 12.0–15.0)
MCH: 30.2 pg (ref 26.0–34.0)
MCHC: 34.4 g/dL (ref 30.0–36.0)
MCV: 87.8 fL (ref 80.0–100.0)
Platelets: 237 10*3/uL (ref 150–400)
RBC: 4.43 MIL/uL (ref 3.87–5.11)
RDW: 11.4 % — ABNORMAL LOW (ref 11.5–15.5)
WBC: 3.4 10*3/uL — ABNORMAL LOW (ref 4.0–10.5)
nRBC: 0 % (ref 0.0–0.2)

## 2022-05-11 LAB — PROTIME-INR
INR: 0.9 (ref 0.8–1.2)
Prothrombin Time: 11.9 seconds (ref 11.4–15.2)

## 2022-05-11 MED ORDER — SODIUM CHLORIDE 0.9 % IV SOLN: INTRAVENOUS | Status: DC

## 2022-05-11 MED ORDER — MIDAZOLAM HCL 2 MG/2ML IJ SOLN
INTRAMUSCULAR | Status: AC
  Filled 2022-05-11: qty 4

## 2022-05-11 MED ORDER — SODIUM CHLORIDE 0.9 % IV SOLN
INTRAVENOUS | Status: AC | PRN
  Administered 2022-05-11: 10 mL/h via INTRAVENOUS

## 2022-05-11 MED ORDER — MIDAZOLAM HCL 2 MG/2ML IJ SOLN
INTRAMUSCULAR | Status: AC | PRN
  Administered 2022-05-11 (×2): 1 mg via INTRAVENOUS

## 2022-05-11 MED ORDER — FENTANYL CITRATE (PF) 100 MCG/2ML IJ SOLN
INTRAMUSCULAR | Status: AC | PRN
  Administered 2022-05-11 (×2): 50 ug via INTRAVENOUS

## 2022-05-11 MED ORDER — FENTANYL CITRATE (PF) 100 MCG/2ML IJ SOLN
INTRAMUSCULAR | Status: AC
  Filled 2022-05-11: qty 2

## 2022-05-11 NOTE — Procedures (Addendum)
Interventional Radiology Procedure Note  Procedure: CT guided biopsy of right RP lymph node/mass Complications: None EBL: None Recommendations: - Bedrest 1 hours.   - Routine wound care - Follow up pathology - Advance diet  - DC 1 hr home - ok to restart all home meds  Signed,  Corrie Mckusick, DO

## 2022-05-11 NOTE — Sedation Documentation (Signed)
Patient is resting comfortably. 

## 2022-05-11 NOTE — H&P (Signed)
Chief Complaint: Patient was seen in consultation today for periureteral mass  Referring Physician(s): Bell,Eugene D III  Supervising Physician: Corrie Mckusick  Patient Status: [facility] - Out-pt  History of Present Illness: [name] is a 57 y.o. female with past medical history of CKD, HTN, uterine fibroid, who is followed by Dr. Link Snuffer for bilateral ureteral stricture with associated retroperitoneal mass/lesion.  She has undergone cystoscopy with bilateral retrograde pyelogram and is s/p left ureteral stent placement for hydronephrosis, however diagnosis has not been accomplished.  She is referred to IR for right retroperitoneal mass biopsy.   [name] presents today in her usual state of health.  She has been NPO.  She does not take blood thinners.  Discussed her case, complexity, risks/benefits.  She is agreeable to proceed.  She has no new complaints or concerns today.  She reports clear, yellow urine with difficulty.   Past Medical History:  Diagnosis Date   Allergy    Chronic kidney disease    COVID-19 virus infection 06/2019   Dermatofibrosarcoma protuberans 2014   Ectatic abdominal aorta ([place]) 01/2015   rpt [place] 5 yrs   Fatty liver 01/2015   by US   Gallbladder polyp 01/2015   24m by [place]  Hypertension    Iron deficiency    Seasonal allergies    Uterine fibroid 01/2015   markedly enlarged    Past Surgical History:  Procedure Laterality Date   COLONOSCOPY  10/2019   benign polyp, rpt 10 yrs ([place])   CYSTOSCOPY WITH RETROGRADE PYELOGRAM, URETEROSCOPY AND STENT PLACEMENT Bilateral 04/20/2022   Procedure: CYSTOSCOPY WITH BILATERAL RETROGRADE PYELOGRAM, LEFT URETEROSCOPY AND STENT PLACEMENT;  Surgeon: BLucas Mallow MD;  [facility]  Service: Urology;  Laterality: Bilateral;  1 HR FOR CASE   ENDOMETRIAL ABLATION  05/04/2008   fibroids   HYSTERECTOMY ABDOMINAL WITH SALPINGECTOMY Bilateral 07/22/2017   Procedure: HYSTERECTOMY  ABDOMINAL WITH SALPINGECTOMY;  Surgeon: HMolli Posey MD;  [facility];  Service: Gynecology;  Laterality: Bilateral;   MASS EXCISION     lower abdomen dermatofibrosarcoma ([facility])   OOPHORECTOMY Right 07/22/2017   Procedure: OOPHORECTOMY;  Surgeon: HMolli Posey MD;  [facility];  Service: Gynecology;  Laterality: Right;   TUBAL LIGATION  05/05/1995    Allergies: Patient has no known allergies.  Medications: Prior to Admission medications   Medication Sig Start Date End Date Taking? Authorizing Provider  amLODipine (NORVASC) 5 MG tablet TAKE 1 TABLET BY MOUTH IN THE MORNING AND AT BEDTIME 01/22/22  Yes GRia Bush MD  ascorbic acid (VITAMIN C) 500 MG tablet Take 500 mg by mouth daily.   Yes [provider]  fluticasone (FLONASE) 50 MCG/ACT nasal spray Use 2 spray(s) in each nostril once daily Patient taking differently: Place 1 spray into both nostrils daily as needed for allergies. 04/09/21  Yes GRia Bush MD  levocetirizine (XYZAL) 5 MG tablet TAKE 1 TABLET BY MOUTH ONCE DAILY IN THE EVENING Patient taking differently: Take 5 mg by mouth daily as needed for allergies. 04/09/21  Yes GRia Bush MD  Multiple Vitamin (MULTIVITAMIN ADULT) TABS Take 1 tablet by mouth daily. 12/24/20  Yes GRia Bush MD  HYDROcodone-acetaminophen (NORCO/VICODIN) 5-325 MG tablet Take 1 tablet by mouth every 4 (four) hours as needed for moderate pain. Patient not taking: Reported on 05/08/2022 04/20/22 04/20/23  BLucas Mallow MD     Family History  Problem Relation Age of Onset   Diabetes Mother  Hypertension Mother    Diabetes Father    Hypertension Father    Cancer Paternal Aunt 78       breast   Stroke Neg Hx    Coronary artery disease Neg Hx    Colon cancer Neg Hx    Esophageal cancer Neg Hx    Rectal cancer Neg Hx    Stomach cancer Neg Hx     Social History   Socioeconomic History   Marital status: Married    Spouse name: Not on file    Number of children: Not on file   Years of education: Not on file   Highest education level: Not on file  Occupational History   Not on file  Tobacco Use   Smoking status: Never   Smokeless tobacco: Never  Vaping Use   Vaping Use: Never used  Substance and Sexual Activity   Alcohol use: No   Drug use: No   Sexual activity: Not on file  Other Topics Concern   Not on file  Social History Narrative   Caffeine: 1 cup coffee/day   Lives with husband, 3 sons   Occupation: works for [employer], started going back to school   Activity: going to gym 3x/wk   Diet: good water, fruits and vegetables daily   Social Determinants of Radio broadcast assistant Strain: Not on file  Food Insecurity: Not on file  Transportation Needs: Not on file  Physical Activity: Not on file  Stress: Not on file  Social Connections: Not on file     Review of Systems: A 12 point ROS discussed and pertinent positives are indicated in the HPI above.  All other systems are negative.  Review of Systems  Constitutional:  Negative for fatigue.  Respiratory:  Negative for cough and shortness of breath.   Cardiovascular:  Negative for chest pain.  Gastrointestinal:  Negative for abdominal pain.  Genitourinary:  Negative for difficulty urinating, dysuria, frequency and urgency.  Musculoskeletal:  Negative for back pain.  Psychiatric/Behavioral:  Negative for behavioral problems and confusion.     Vital Signs: BP (!) 155/91   Pulse 85   Temp 98.1 F (36.7 C) (Oral)   Resp 15   Ht '5\' 4"'$  (1.626 m)   Wt 193 lb (87.5 kg)   LMP 06/25/2017   SpO2 95%   BMI 33.13 kg/m   Physical Exam Vitals and nursing note reviewed.  Constitutional:      General: She is not in acute distress.    Appearance: Normal appearance. She is not ill-appearing.  HENT:     Mouth/Throat:     Mouth: Mucous membranes are moist.     Pharynx: Oropharynx is clear.  Cardiovascular:     Rate and Rhythm: Normal rate and regular  rhythm.  Pulmonary:     Effort: Pulmonary effort is normal. No respiratory distress.     Breath sounds: Normal breath sounds.  Abdominal:     General: Abdomen is flat.     Palpations: Abdomen is soft.  Skin:    General: Skin is warm and dry.  Neurological:     General: No focal deficit present.     Mental Status: She is alert and oriented to person, place, and time. Mental status is at baseline.  Psychiatric:        Mood and Affect: Mood normal.        Behavior: Behavior normal.        Thought Content: Thought content normal.  Judgment: Judgment normal.      MD Evaluation Airway: WNL Heart: WNL Abdomen: WNL Chest/ Lungs: WNL ASA  Classification: 3 Mallampati/Airway Score: Two   Imaging: No results found.  Labs:  CBC: Recent Labs    05/13/21 0858 12/23/21 0827 01/22/22 1019 05/11/22 0826  WBC 4.1 4.0 4.3 3.4*  HGB 13.0 12.9 13.6 13.4  HCT 39.4 38.9 40.0 38.9  PLT 218.0 220.0 220 237    COAGS: Recent Labs    05/11/22 0826  INR 0.9    BMP: Recent Labs    05/13/21 0858 12/23/21 0827 01/22/22 1019  NA 135 135 138  K 3.8 4.1 3.7  CL 98 95* 101  CO2 '29 30 31  '$ GLUCOSE 80 80 87  BUN '19 17 17  '$ CALCIUM 9.5 9.2 9.6  CREATININE 1.46* 1.37* 1.27*  GFRNONAA  --   --  50*    LIVER FUNCTION TESTS: Recent Labs    05/13/21 0858 12/23/21 0827 01/22/22 1019  BILITOT  --  0.7 0.5  AST  --  19 19  ALT  --  13 13  ALKPHOS  --  52 54  PROT  --  7.4 8.3*  ALBUMIN 4.7 4.5 4.7    TUMOR MARKERS: No results for input(s): "AFPTM", "CEA", "CA199", "CHROMGRNA" in the last 8760 hours.  Assessment and Plan: Patient with past medical history of hydronephrosis, periureteral mass presents with complaint of right periureteral mass.  IR consulted for biopsy at the request of Dr. Gloriann Loan. Case reviewed by Dr. Earleen Newport who approves patient for procedure.  Patient presents today in their usual state of health.  She has been NPO and is not currently on blood  thinners.  She is aware that her case is complex and mass is small. She is agreeable to proceed.   Risks and benefits of biopsy was discussed with the patient and/or patient's family including, but not limited to bleeding, infection, damage to adjacent structures or low yield requiring additional tests.  All of the questions were answered and there is agreement to proceed.  Consent signed and in chart.  Thank you for this interesting consult.  I greatly enjoyed meeting [name] and look forward to participating in their care.  A copy of this report was sent to the requesting provider on this date.  Electronically Signed: Docia Barrier, PA 05/11/2022, 9:35 AM   I spent a total of  30 Minutes   in face to face in clinical consultation, greater than 50% of which was counseling/coordinating care for right retroperitoneal mass.

## 2022-05-13 LAB — SURGICAL PATHOLOGY

## 2022-05-25 ENCOUNTER — Other Ambulatory Visit (HOSPITAL_COMMUNITY): Payer: Self-pay | Admitting: Urology

## 2022-05-25 DIAGNOSIS — N1330 Unspecified hydronephrosis: Secondary | ICD-10-CM

## 2022-05-25 DIAGNOSIS — N1831 Chronic kidney disease, stage 3a: Secondary | ICD-10-CM

## 2022-06-04 DIAGNOSIS — N130 Hydronephrosis with ureteropelvic junction obstruction: Secondary | ICD-10-CM | POA: Diagnosis not present

## 2022-06-04 DIAGNOSIS — C669 Malignant neoplasm of unspecified ureter: Secondary | ICD-10-CM | POA: Diagnosis not present

## 2022-06-09 ENCOUNTER — Other Ambulatory Visit: Payer: Self-pay | Admitting: Urology

## 2022-06-11 ENCOUNTER — Ambulatory Visit (HOSPITAL_COMMUNITY)
Admission: RE | Admit: 2022-06-11 | Discharge: 2022-06-11 | Disposition: A | Discharge status: Home / Self Care | Payer: Federal, State, Local not specified - PPO | Source: Ambulatory Visit | Attending: Urology | Admitting: Urology

## 2022-06-11 DIAGNOSIS — N261 Atrophy of kidney (terminal): Secondary | ICD-10-CM | POA: Diagnosis not present

## 2022-06-11 DIAGNOSIS — N1330 Unspecified hydronephrosis: Secondary | ICD-10-CM | POA: Diagnosis not present

## 2022-06-11 DIAGNOSIS — N1831 Chronic kidney disease, stage 3a: Secondary | ICD-10-CM | POA: Insufficient documentation

## 2022-06-11 DIAGNOSIS — N184 Chronic kidney disease, stage 4 (severe): Secondary | ICD-10-CM | POA: Diagnosis not present

## 2022-06-11 MED ORDER — FUROSEMIDE 10 MG/ML IJ SOLN
INTRAMUSCULAR | Status: AC
  Administered 2022-06-11: 44 mg via INTRAVENOUS
  Filled 2022-06-11: qty 8

## 2022-06-11 MED ORDER — TECHNETIUM TC 99M MERTIATIDE
4.9100 | Freq: Once | INTRAVENOUS | Status: AC
  Administered 2022-06-11: 4.91 via INTRAVENOUS

## 2022-06-11 MED ORDER — FUROSEMIDE 10 MG/ML IJ SOLN: 40.0000 mg | Freq: Once | INTRAMUSCULAR | Status: AC

## 2022-06-17 IMAGING — US US ABDOMEN COMPLETE
2 series · 13 of 25 positions shown · non-contrast
Comparison: January 18, 2015

CLINICAL DATA: Gallbladder polyp.

EXAM:
ABDOMEN ULTRASOUND COMPLETE

[Series 1: us abdomen complete · 0.26mm/px · 12 of 116 slices shown (1 of 2)]
[im 1/116]
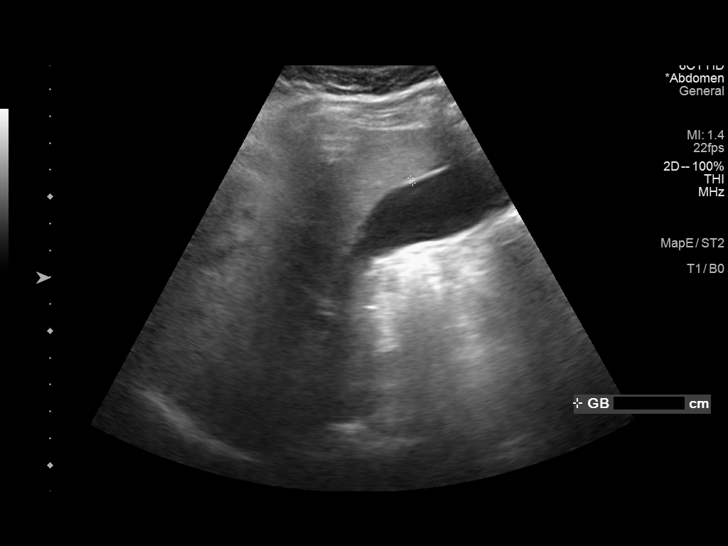
[im 11/116]
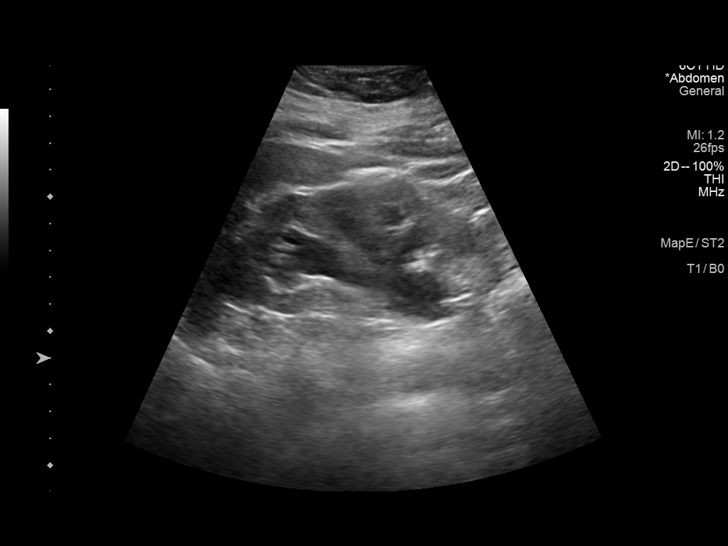
[im 21/116]
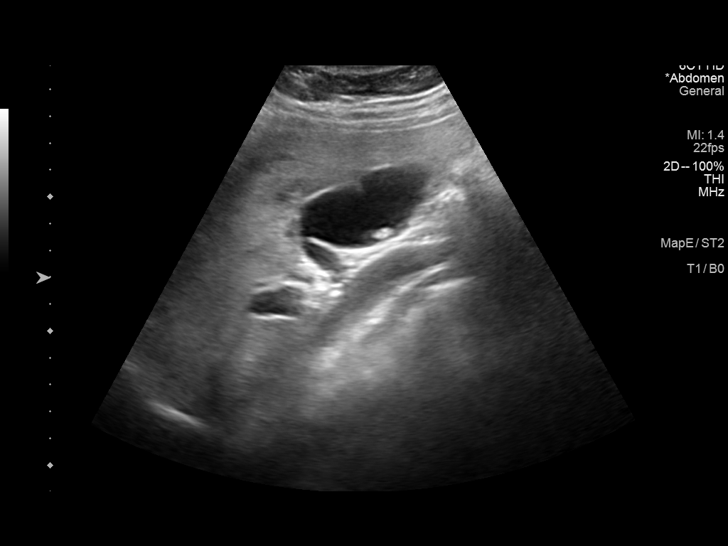
[im 31/116]
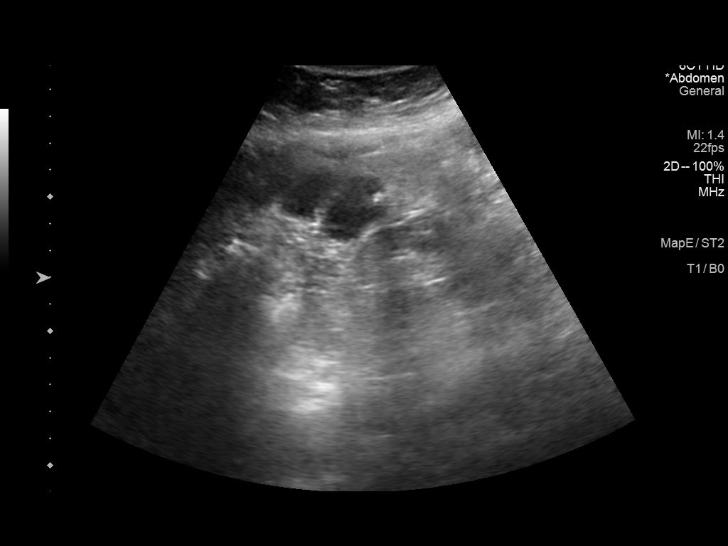
[im 41/116]
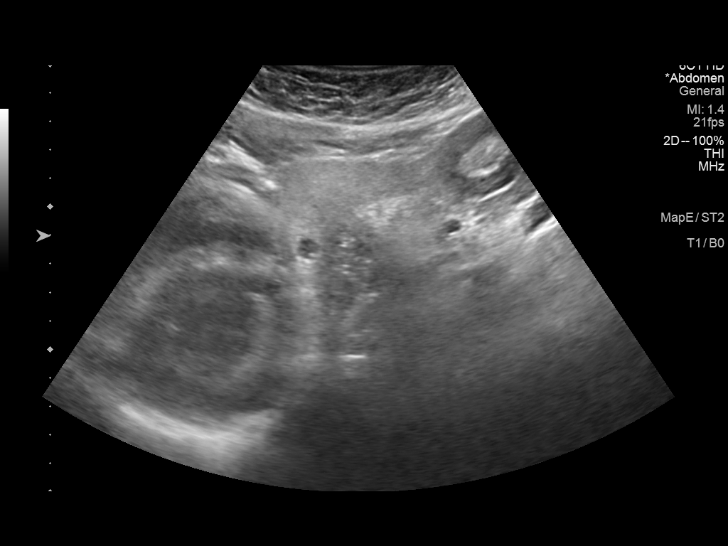
[im 51/116]
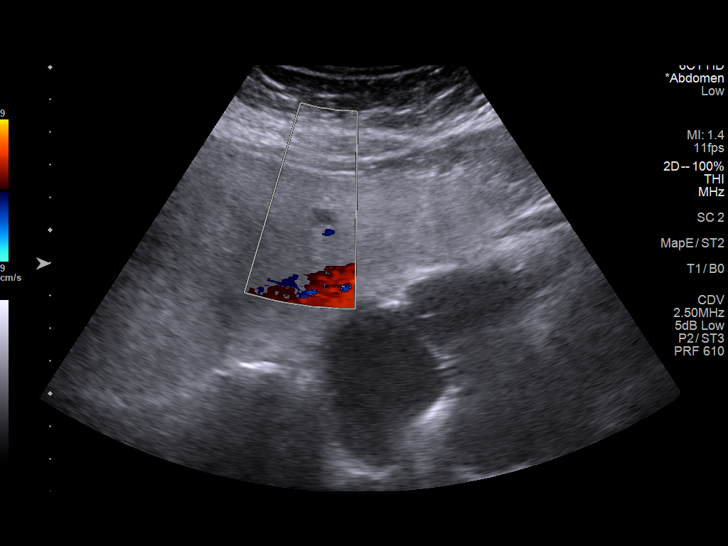
[im 61/116]
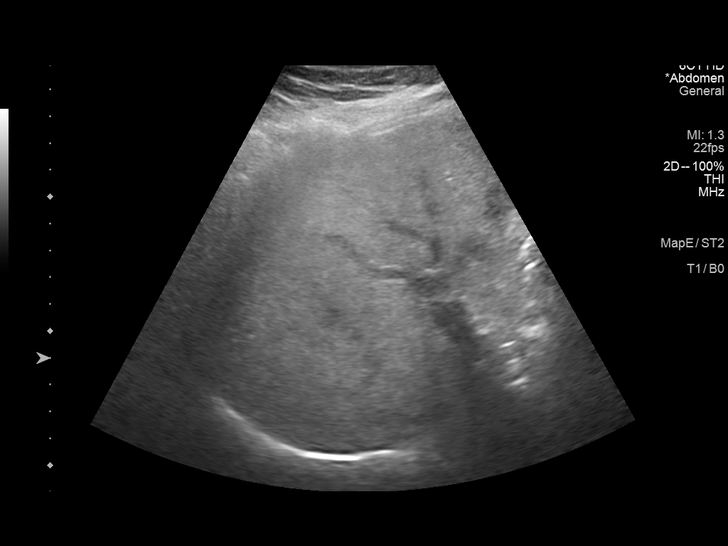
[im 71/116]
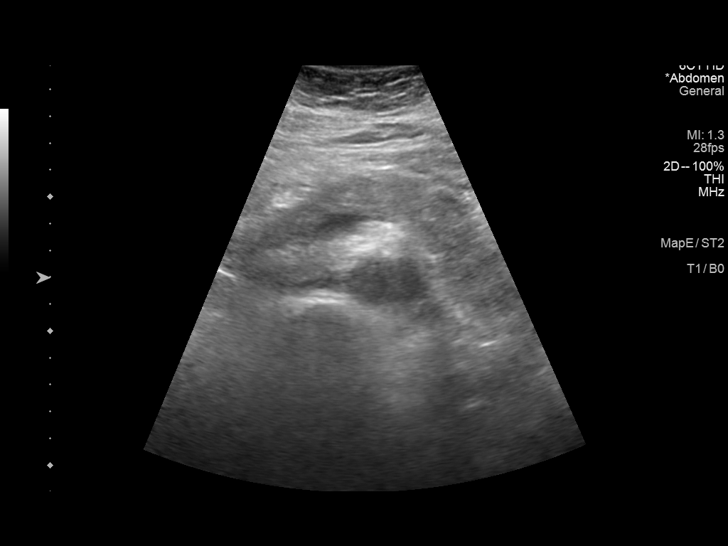
[im 81/116]
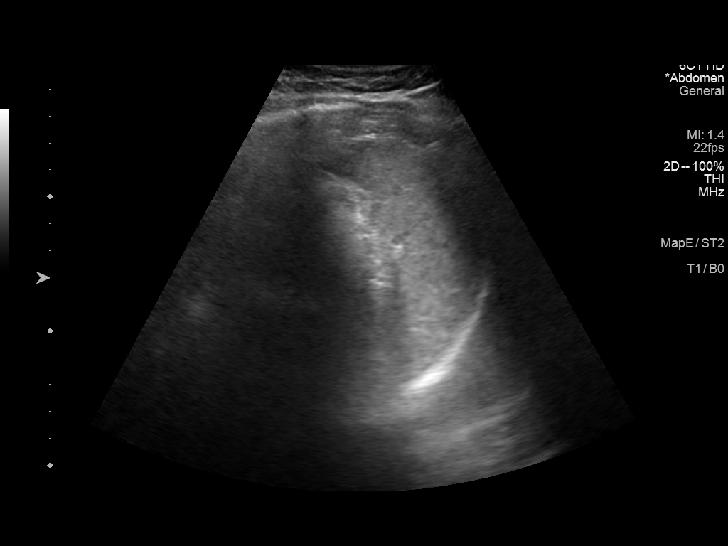
[im 91/116]
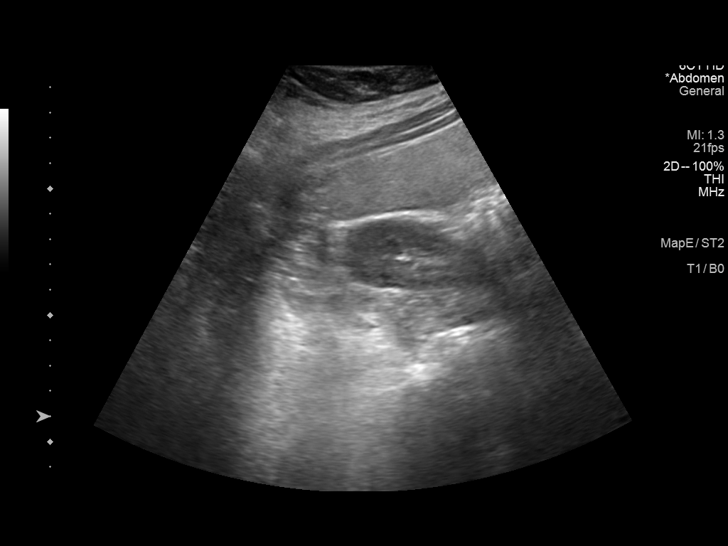
[im 101/116]
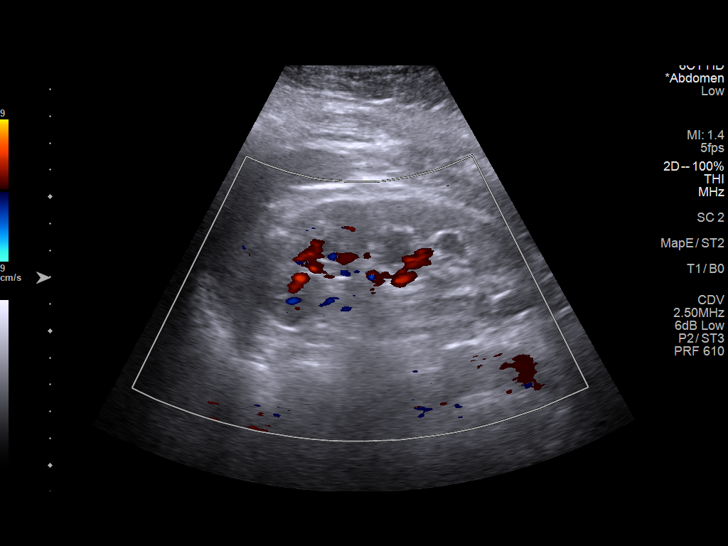
[im 111/116]
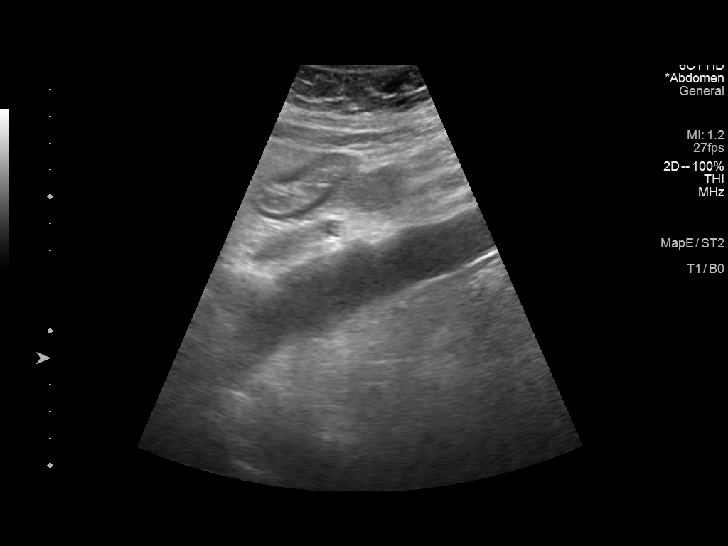

[Series 2001: us abdomen complete · 0.25mm/px · 1 of 3 slices shown (2 of 2)]
[im 1/3]
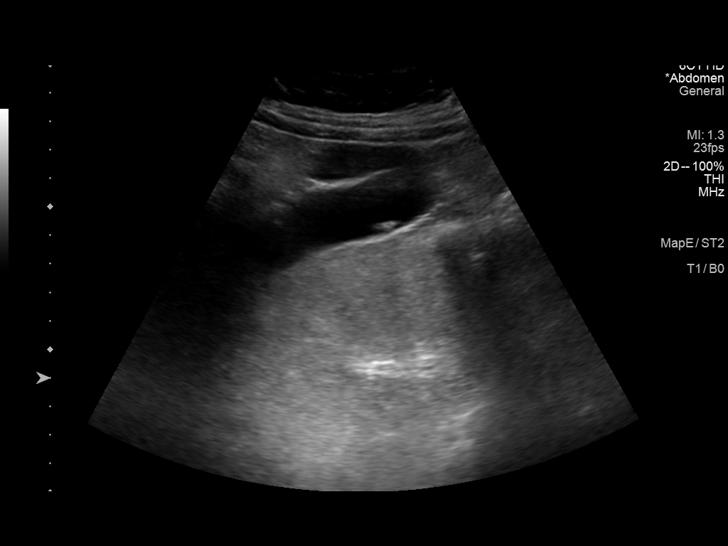

[13 of 25 positions shown; findings below may reference images not displayed]

FINDINGS: Gallbladder: There is a 4 mm polyp in the gallbladder, not requiring
follow-up. The polyp measured 6 mm in Saturday January, 2015. There is
also a single 10 mm stone in the gallbladder. No wall thickening,
pericholecystic fluid, sludge, or Murphy's sign.

Common bile duct: Diameter: 3 mm

Liver: There is diffuse increased echogenicity in the liver. 2
hypoechoic masses are identified. The first in the left hepatic lobe
measures 7 x 5 x 8 mm, likely a complicated cysts but indeterminate.
The second is indeterminate measuring 7 x 7 x 9 mm, not definitely
cystic. These masses were not seen in 6804. Portal vein is patent on
color Doppler imaging with normal direction of blood flow towards
the liver.

IVC: No abnormality visualized.

Pancreas: Visualized portion unremarkable.

Spleen: Size and appearance within normal limits.

Right Kidney: Length: 10.6 cm. Echogenicity within normal limits. No
mass or hydronephrosis visualized.

Left Kidney: Length: 10.7 cm. Moderate hydronephrosis, not changed
on postvoid imaging.

Abdominal aorta: No aneurysm visualized.

Other findings: None.
IMPRESSION: 1. Moderate left hydronephrosis, not seen in 6804, of uncertain
etiology and chronicity.
2. Two hypoechoic masses are seen in the left hepatic lobe. The
largest measures 8 mm and is favored to represent a complicated cyst
but is indeterminate. The second measures 9 mm and is not definitely
cystic. An MRI could more definitively characterize these 2 masses.
3. 4 mm polyp in the gallbladder, not requiring follow-up.
4. Single stone in the gallbladder. No wall thickening,
pericholecystic fluid, or Murphy's sign.

These results will be called to the ordering clinician or
representative by the Radiologist Assistant, and communication
documented in the PACS or [REDACTED].

## 2022-06-30 ENCOUNTER — Encounter: Payer: Self-pay | Admitting: Pharmacist

## 2022-07-03 DIAGNOSIS — N2889 Other specified disorders of kidney and ureter: Secondary | ICD-10-CM

## 2022-07-03 HISTORY — DX: Other specified disorders of kidney and ureter: N28.89

## 2022-07-06 ENCOUNTER — Telehealth: Payer: Self-pay

## 2022-07-06 NOTE — Telephone Encounter (Signed)
[name] - Client Nonclinical Telephone Record  [facility] Client [name] Provider Ria Bush - MD Contact Type Call Who Is Calling Patient / Member / Family / Caregiver Caller Name [place] Phone Number [phone] Patient Name [name] Patient DOB 27-Sep-1965 Call Type Message Only Information Provided Reason for Call Request to Reschedule Office Appointment Initial Comment Caller states that she needs to reschedule her appointment that is for tomorrow. Declined triage. Patient request to speak to RN No Disp. Time Disposition Final User 07/06/2022 7:55:07 AM General Information Provided Yes [name] Call Closed By: [name] Transaction Date/Time: 07/06/2022 7:53:04 AM (ET   Sending note to [facility] support.

## 2022-07-06 NOTE — Telephone Encounter (Signed)
Patient has been rescheduled.

## 2022-07-07 ENCOUNTER — Ambulatory Visit: Payer: Federal, State, Local not specified - PPO | Admitting: Family Medicine

## 2022-07-07 DIAGNOSIS — N1831 Chronic kidney disease, stage 3a: Secondary | ICD-10-CM | POA: Diagnosis not present

## 2022-07-07 DIAGNOSIS — N130 Hydronephrosis with ureteropelvic junction obstruction: Secondary | ICD-10-CM | POA: Diagnosis not present

## 2022-07-07 DIAGNOSIS — C669 Malignant neoplasm of unspecified ureter: Secondary | ICD-10-CM | POA: Diagnosis not present

## 2022-07-07 NOTE — Patient Instructions (Signed)
SURGICAL WAITING ROOM VISITATION Patients having surgery or a procedure may have no more than 2 support people in the waiting area - these visitors may rotate in the visitor waiting room.   Due to an increase in RSV and influenza rates and associated hospitalizations, children ages 13 and under may not visit patients in [place]. If the patient needs to stay at the hospital during part of their recovery, the visitor guidelines for inpatient rooms apply.  PRE-OP VISITATION  Pre-op nurse will coordinate an appropriate time for 1 support person to accompany the patient in pre-op.  This support person may not rotate.  This visitor will be contacted when the time is appropriate for the visitor to come back in the pre-op area.  Please refer to the [facility] website for the visitor guidelines for Inpatients (after your surgery is over and you are in a regular room).  You are not required to quarantine at this time prior to your surgery. However, you must do this: Hand Hygiene often Do NOT share personal items Notify your provider if you are in close contact with someone who has COVID or you develop fever 100.4 or greater, new onset of sneezing, cough, sore throat, shortness of breath or body aches.  If you test positive for Covid or have been in contact with anyone that has tested positive in the last 10 days please notify you surgeon.    Your procedure is scheduled on:  Friday  July 17, 2022  Report to [facility] Main Entrance: [place] entrance where the [employer] is available.   Report to admitting at: 10:15  AM  +++++Call this number if you have any questions or problems the morning of surgery [phone]  DO NOT EAT OR DRINK ANYTHING AFTER MIDNIGHT THE NIGHT PRIOR TO YOUR SURGERY / PROCEDURE.   FOLLOW BOWEL PREP AND ANY ADDITIONAL PRE OP INSTRUCTIONS YOU RECEIVED FROM YOUR SURGEON'S OFFICE!!!   Oral Hygiene is also important to reduce your risk of infection.         Remember - BRUSH YOUR TEETH THE MORNING OF SURGERY WITH YOUR REGULAR TOOTHPASTE  Do NOT smoke after Midnight the night before surgery.  Take ONLY these medicines the morning of surgery with A SIP OF WATER: amlodipine. You may use your Flonase nasal spray if needed.                     You may not have any metal on your body including hair pins, jewelry, and body piercing  Do not wear make-up, lotions, powders, perfumes  or deodorant  Do not wear nail polish including gel and S&S, artificial / acrylic nails, or any other type of covering on natural nails including finger and toenails. If you have artificial nails, gel coating, etc., that needs to be removed by a nail salon, Please have this removed prior to surgery. Not doing so may mean that your surgery could be cancelled or delayed if the Surgeon or anesthesia staff feels like they are unable to monitor you safely.   Do not shave 48 hours prior to surgery to avoid nicks in your skin which may contribute to postoperative infections.   Contacts, Hearing Aids, dentures or bridgework may not be worn into surgery. DENTURES WILL BE REMOVED PRIOR TO SURGERY PLEASE DO NOT APPLY "Poly grip" OR ADHESIVES!!!  You may bring a small overnight bag with you on the day of surgery, only pack items that are not valuable. [facility] IS NOT RESPONSIBLE   FOR VALUABLES THAT ARE LOST OR STOLEN.   Do not bring your home medications to the hospital. The Pharmacy will dispense medications listed on your medication list to you during your admission in the Hospital.  Special Instructions: Bring a copy of your healthcare power of attorney and living will documents the day of surgery, if you wish to have them scanned into your [place] [facility] Records- EPIC  Please read over the following fact sheets you were given: IF YOU HAVE QUESTIONS ABOUT YOUR [place], [place] [phone].   [facility] - Preparing for Surgery Before surgery, you can  play an important role.  Because skin is not sterile, your skin needs to be as free of germs as possible.  You can reduce the number of germs on your skin by washing with CHG (chlorahexidine gluconate) soap before surgery.  CHG is an antiseptic cleaner which kills germs and bonds with the skin to continue killing germs even after washing. Please DO NOT use if you have an allergy to CHG or antibacterial soaps.  If your skin becomes reddened/irritated stop using the CHG and inform your nurse when you arrive at Short Stay. Do not shave (including legs and underarms) for at least 48 hours prior to the first CHG shower.  You may shave your face/neck.  Please follow these instructions carefully:  1.  Shower with CHG Soap the night before surgery and the  morning of surgery.  2.  If you choose to wash your hair, wash your hair first as usual with your normal  shampoo.  3.  After you shampoo, rinse your hair and body thoroughly to remove the shampoo.                             4.  Use CHG as you would any other liquid soap.  You can apply chg directly to the skin and wash.  Gently with a scrungie or clean washcloth.  5.  Apply the CHG Soap to your body ONLY FROM THE NECK DOWN.   Do not use on face/ open                           Wound or open sores. Avoid contact with eyes, ears mouth and genitals (private parts).                       Wash face,  Genitals (private parts) with your normal soap.             6.  Wash thoroughly, paying special attention to the area where your  surgery  will be performed.  7.  Thoroughly rinse your body with warm water from the neck down.  8.  DO NOT shower/wash with your normal soap after using and rinsing off the CHG Soap.            9.  Pat yourself dry with a clean towel.            10.  Wear clean pajamas.            11.  Place clean sheets on your bed the night of your first shower and do not  sleep with pets.  ON THE DAY OF SURGERY : Do not apply any  lotions/deodorants the morning of surgery.  Please wear clean clothes to the hospital/surgery center.  FAILURE TO FOLLOW THESE INSTRUCTIONS MAY RESULT IN THE CANCELLATION OF YOUR SURGERY  PATIENT SIGNATURE_________________________________  NURSE SIGNATURE__________________________________  ________________________________________________________________________

## 2022-07-07 NOTE — Progress Notes (Signed)
COVID Vaccine received:  '[]'$  No '[x]'$  Yes Date of any COVID positive Test in last 90 days:  PCP - Ria Bush, MD Cardiologist - none  Chest x-ray -  EKG - (07-09-2017  epic)  will repeat at PST  Stress Test -  ECHO -  Cardiac Cath -   PCR screen: '[]'$  Ordered & Completed                      '[]'$   No Order but Needs PROFEND                      '[x]'$   N/A for this surgery  Surgery Plan:  '[]'$  Ambulatory                            '[]'$  Outpatient in bed                            '[x]'$  Admit  Anesthesia:    '[x]'$  General  '[]'$  Spinal                           '[]'$   Choice '[]'$   MAC  Bowel Prep - '[]'$  No  '[x]'$   Yes _1 bottle Mag. Citrate   drink by noon the day before surgery  Pacemaker / ICD device '[x]'$  No '[]'$  Yes        Device order form faxed '[x]'$  No    '[]'$   Yes      Faxed to:  Spinal Cord Stimulator:'[x]'$  No '[]'$  Yes      (Remind patient to bring remote DOS) Other Implants:   History of Sleep Apnea? '[x]'$  No '[]'$  Yes   CPAP used?- '[x]'$  No '[]'$  Yes    Does the patient monitor blood sugar? '[]'$  No '[]'$  Yes  '[x]'$  N/A  Patient has: '[]'$  Pre-DM   '[]'$  DM1  '[]'$   DM2 Does patient have a [employer] or Dexacom? '[]'$  No '[]'$  Yes   Fasting Blood Sugar Ranges-  Checks Blood Sugar _____ times a day  Blood Thinner / Instructions:  none Aspirin Instructions:  none  ERAS Protocol Ordered: '[x]'$  No  '[]'$  Yes Patient is to be NPO after:  midnight prior  Comments:   Activity level: Patient is able / unable to climb a flight of stairs without difficulty; '[]'$  No CP  '[]'$  No SOB, but would have ___   Patient can / can not perform ADLs without assistance.   Anesthesia review: HTN, Fatty liver, liver cyst, CKD3  Patient denies shortness of breath, fever, cough and chest pain at PAT appointment.  Patient verbalized understanding and agreement to the Pre-Surgical Instructions that were given to them at this PAT appointment. Patient was also educated of the need to review these PAT instructions again prior to his/her surgery.I  reviewed the appropriate phone numbers to call if they have any and questions or concerns.

## 2022-07-09 ENCOUNTER — Encounter (HOSPITAL_COMMUNITY): Payer: Self-pay

## 2022-07-09 ENCOUNTER — Encounter (HOSPITAL_COMMUNITY)
Admission: RE | Admit: 2022-07-09 | Discharge: 2022-07-09 | Disposition: A | Discharge status: Home / Self Care | Payer: Federal, State, Local not specified - PPO | Source: Ambulatory Visit | Attending: Urology | Admitting: Urology

## 2022-07-09 ENCOUNTER — Other Ambulatory Visit: Payer: Self-pay

## 2022-07-09 VITALS — BP 150/92 | HR 76 | Temp 98.7°F | Resp 18 | Ht 65.0 in | Wt 208.0 lb

## 2022-07-09 DIAGNOSIS — K760 Fatty (change of) liver, not elsewhere classified: Secondary | ICD-10-CM | POA: Diagnosis not present

## 2022-07-09 DIAGNOSIS — K7689 Other specified diseases of liver: Secondary | ICD-10-CM | POA: Diagnosis not present

## 2022-07-09 DIAGNOSIS — I10 Essential (primary) hypertension: Secondary | ICD-10-CM | POA: Diagnosis not present

## 2022-07-09 DIAGNOSIS — Z01818 Encounter for other preprocedural examination: Secondary | ICD-10-CM | POA: Insufficient documentation

## 2022-07-09 LAB — COMPREHENSIVE METABOLIC PANEL
ALT: 22 U/L (ref 0–44)
AST: 27 U/L (ref 15–41)
Albumin: 4.5 g/dL (ref 3.5–5.0)
Alkaline Phosphatase: 56 U/L (ref 38–126)
Anion gap: 9 (ref 5–15)
BUN: 14 mg/dL (ref 6–20)
CO2: 28 mmol/L (ref 22–32)
Calcium: 9.3 mg/dL (ref 8.9–10.3)
Chloride: 101 mmol/L (ref 98–111)
Creatinine, Ser: 1.11 mg/dL — ABNORMAL HIGH (ref 0.44–1.00)
GFR, Estimated: 59 mL/min — ABNORMAL LOW (ref >60)
Glucose, Bld: 92 mg/dL (ref 70–99)
Potassium: 4.3 mmol/L (ref 3.5–5.1)
Sodium: 138 mmol/L (ref 135–145)
Total Bilirubin: 0.6 mg/dL (ref 0.3–1.2)
Total Protein: 7.8 g/dL (ref 6.5–8.1)

## 2022-07-09 LAB — CBC
HCT: 40.5 % (ref 36.0–46.0)
Hemoglobin: 13.1 g/dL (ref 12.0–15.0)
MCH: 29 pg (ref 26.0–34.0)
MCHC: 32.3 g/dL (ref 30.0–36.0)
MCV: 89.6 fL (ref 80.0–100.0)
Platelets: 246 10*3/uL (ref 150–400)
RBC: 4.52 MIL/uL (ref 3.87–5.11)
RDW: 11.8 % (ref 11.5–15.5)
WBC: 4.3 10*3/uL (ref 4.0–10.5)
nRBC: 0 % (ref 0.0–0.2)

## 2022-07-17 ENCOUNTER — Encounter (HOSPITAL_COMMUNITY)
Admission: RE | Disposition: A | Discharge status: Home / Self Care | Payer: Self-pay | Source: Home / Self Care | Attending: Urology

## 2022-07-17 ENCOUNTER — Inpatient Hospital Stay (HOSPITAL_COMMUNITY): Payer: Federal, State, Local not specified - PPO | Admitting: Anesthesiology

## 2022-07-17 ENCOUNTER — Other Ambulatory Visit: Payer: Self-pay

## 2022-07-17 ENCOUNTER — Encounter (HOSPITAL_COMMUNITY): Payer: Self-pay | Admitting: Urology

## 2022-07-17 ENCOUNTER — Inpatient Hospital Stay (HOSPITAL_COMMUNITY): Payer: Federal, State, Local not specified - PPO

## 2022-07-17 ENCOUNTER — Inpatient Hospital Stay (HOSPITAL_COMMUNITY)
Admission: RE | Admit: 2022-07-17 | Discharge: 2022-07-19 | DRG: 357 | Disposition: A | Discharge status: Home / Self Care | Payer: Federal, State, Local not specified - PPO | Attending: Urology | Admitting: Urology

## 2022-07-17 DIAGNOSIS — Z780 Asymptomatic menopausal state: Secondary | ICD-10-CM | POA: Diagnosis not present

## 2022-07-17 DIAGNOSIS — N261 Atrophy of kidney (terminal): Secondary | ICD-10-CM | POA: Diagnosis not present

## 2022-07-17 DIAGNOSIS — Z8249 Family history of ischemic heart disease and other diseases of the circulatory system: Secondary | ICD-10-CM

## 2022-07-17 DIAGNOSIS — D484 Neoplasm of uncertain behavior of peritoneum: Principal | ICD-10-CM | POA: Diagnosis present

## 2022-07-17 DIAGNOSIS — K760 Fatty (change of) liver, not elsewhere classified: Secondary | ICD-10-CM | POA: Diagnosis not present

## 2022-07-17 DIAGNOSIS — Z8616 Personal history of COVID-19: Secondary | ICD-10-CM | POA: Diagnosis not present

## 2022-07-17 DIAGNOSIS — K660 Peritoneal adhesions (postprocedural) (postinfection): Secondary | ICD-10-CM | POA: Diagnosis present

## 2022-07-17 DIAGNOSIS — Z1152 Encounter for screening for COVID-19: Secondary | ICD-10-CM

## 2022-07-17 DIAGNOSIS — E669 Obesity, unspecified: Secondary | ICD-10-CM | POA: Diagnosis present

## 2022-07-17 DIAGNOSIS — N368 Other specified disorders of urethra: Principal | ICD-10-CM | POA: Diagnosis present

## 2022-07-17 DIAGNOSIS — N9489 Other specified conditions associated with female genital organs and menstrual cycle: Secondary | ICD-10-CM | POA: Diagnosis not present

## 2022-07-17 DIAGNOSIS — Z833 Family history of diabetes mellitus: Secondary | ICD-10-CM

## 2022-07-17 DIAGNOSIS — I82890 Acute embolism and thrombosis of other specified veins: Secondary | ICD-10-CM | POA: Diagnosis not present

## 2022-07-17 DIAGNOSIS — N8312 Corpus luteum cyst of left ovary: Secondary | ICD-10-CM | POA: Diagnosis not present

## 2022-07-17 DIAGNOSIS — Z5331 Laparoscopic surgical procedure converted to open procedure: Secondary | ICD-10-CM

## 2022-07-17 DIAGNOSIS — R1909 Other intra-abdominal and pelvic swelling, mass and lump: Secondary | ICD-10-CM | POA: Diagnosis not present

## 2022-07-17 DIAGNOSIS — N80102 Endometriosis of left ovary, unspecified depth: Secondary | ICD-10-CM | POA: Diagnosis present

## 2022-07-17 DIAGNOSIS — Z6834 Body mass index (BMI) 34.0-34.9, adult: Secondary | ICD-10-CM

## 2022-07-17 DIAGNOSIS — N183 Chronic kidney disease, stage 3 unspecified: Secondary | ICD-10-CM | POA: Diagnosis not present

## 2022-07-17 DIAGNOSIS — N131 Hydronephrosis with ureteral stricture, not elsewhere classified: Secondary | ICD-10-CM | POA: Diagnosis present

## 2022-07-17 DIAGNOSIS — N130 Hydronephrosis with ureteropelvic junction obstruction: Secondary | ICD-10-CM | POA: Diagnosis not present

## 2022-07-17 DIAGNOSIS — N837 Hematoma of broad ligament: Secondary | ICD-10-CM | POA: Diagnosis not present

## 2022-07-17 DIAGNOSIS — N83311 Acquired atrophy of right ovary: Secondary | ICD-10-CM | POA: Diagnosis not present

## 2022-07-17 DIAGNOSIS — N2889 Other specified disorders of kidney and ureter: Secondary | ICD-10-CM | POA: Diagnosis not present

## 2022-07-17 HISTORY — PX: XI ROBOTIC ASSISTED OOPHORECTOMY: SHX6823

## 2022-07-17 HISTORY — PX: CYSTOSCOPY W/ URETERAL STENT PLACEMENT: SHX1429

## 2022-07-17 LAB — HEMOGLOBIN AND HEMATOCRIT, BLOOD
HCT: 47 % — ABNORMAL HIGH (ref 36.0–46.0)
Hemoglobin: 15 g/dL (ref 12.0–15.0)

## 2022-07-17 LAB — TYPE AND SCREEN
ABO/RH(D): AB POS
Antibody Screen: NEGATIVE

## 2022-07-17 SURGERY — REIMPLANTATION, URETER, ROBOT-ASSISTED, LAPAROSCOPIC
Anesthesia: General | Laterality: Left

## 2022-07-17 MED ORDER — PHENYLEPHRINE HCL (PRESSORS) 10 MG/ML IV SOLN
INTRAVENOUS | Status: AC
  Filled 2022-07-17: qty 1

## 2022-07-17 MED ORDER — DEXAMETHASONE SODIUM PHOSPHATE 4 MG/ML IJ SOLN
INTRAMUSCULAR | Status: DC | PRN
  Administered 2022-07-17: 10 mg via INTRAVENOUS

## 2022-07-17 MED ORDER — DIPHENHYDRAMINE HCL 50 MG/ML IJ SOLN: 12.5000 mg | Freq: Four times a day (QID) | INTRAMUSCULAR | Status: DC | PRN

## 2022-07-17 MED ORDER — PHENYLEPHRINE HCL (PRESSORS) 10 MG/ML IV SOLN
INTRAVENOUS | Status: DC | PRN
  Administered 2022-07-17 (×3): 80 ug via INTRAVENOUS

## 2022-07-17 MED ORDER — SCOPOLAMINE 1 MG/3DAYS TD PT72
1.0000 | MEDICATED_PATCH | Freq: Once | TRANSDERMAL | Status: DC
  Administered 2022-07-17: 1.5 mg via TRANSDERMAL
  Filled 2022-07-17: qty 1

## 2022-07-17 MED ORDER — AMLODIPINE BESYLATE 10 MG PO TABS
5.0000 mg | ORAL_TABLET | Freq: Two times a day (BID) | ORAL | Status: DC
  Administered 2022-07-17 – 2022-07-18 (×3): 5 mg via ORAL
  Filled 2022-07-17 (×3): qty 1

## 2022-07-17 MED ORDER — PROPOFOL 10 MG/ML IV BOLUS
INTRAVENOUS | Status: DC | PRN
  Administered 2022-07-17: 200 mg via INTRAVENOUS

## 2022-07-17 MED ORDER — MIDAZOLAM HCL 5 MG/5ML IJ SOLN
INTRAMUSCULAR | Status: DC | PRN
  Administered 2022-07-17: 2 mg via INTRAVENOUS

## 2022-07-17 MED ORDER — DIPHENHYDRAMINE HCL 12.5 MG/5ML PO ELIX: 12.5000 mg | ORAL_SOLUTION | Freq: Four times a day (QID) | ORAL | Status: DC | PRN

## 2022-07-17 MED ORDER — MENTHOL 3 MG MT LOZG
1.0000 | LOZENGE | OROMUCOSAL | Status: DC | PRN
  Filled 2022-07-17: qty 9

## 2022-07-17 MED ORDER — FENTANYL CITRATE (PF) 100 MCG/2ML IJ SOLN
INTRAMUSCULAR | Status: DC | PRN
  Administered 2022-07-17 (×3): 50 ug via INTRAVENOUS
  Administered 2022-07-17: 100 ug via INTRAVENOUS

## 2022-07-17 MED ORDER — KETAMINE HCL 10 MG/ML IJ SOLN
INTRAMUSCULAR | Status: AC
  Filled 2022-07-17: qty 1

## 2022-07-17 MED ORDER — FENTANYL CITRATE (PF) 250 MCG/5ML IJ SOLN
INTRAMUSCULAR | Status: AC
  Filled 2022-07-17: qty 5

## 2022-07-17 MED ORDER — ACETAMINOPHEN 500 MG PO TABS
1000.0000 mg | ORAL_TABLET | Freq: Once | ORAL | Status: AC
  Administered 2022-07-17: 1000 mg via ORAL
  Filled 2022-07-17: qty 2

## 2022-07-17 MED ORDER — OXYCODONE HCL 5 MG PO TABS: 5.0000 mg | ORAL_TABLET | Freq: Once | ORAL | Status: DC | PRN

## 2022-07-17 MED ORDER — SODIUM CHLORIDE 0.9 % IR SOLN
Status: DC | PRN
  Administered 2022-07-17: 1000 mL via INTRAVESICAL

## 2022-07-17 MED ORDER — STERILE WATER FOR IRRIGATION IR SOLN
Status: DC | PRN
  Administered 2022-07-17: 1000 mL

## 2022-07-17 MED ORDER — IOHEXOL 300 MG/ML  SOLN
INTRAMUSCULAR | Status: DC | PRN
  Administered 2022-07-17: 20 mL

## 2022-07-17 MED ORDER — FENTANYL CITRATE PF 50 MCG/ML IJ SOSY
PREFILLED_SYRINGE | INTRAMUSCULAR | Status: AC
  Filled 2022-07-17: qty 1

## 2022-07-17 MED ORDER — LACTATED RINGERS IR SOLN
Status: DC | PRN
  Administered 2022-07-17: 1000 mL

## 2022-07-17 MED ORDER — PROPOFOL 500 MG/50ML IV EMUL
INTRAVENOUS | Status: DC | PRN
  Administered 2022-07-17: 25 ug/kg/min via INTRAVENOUS

## 2022-07-17 MED ORDER — CEFAZOLIN SODIUM-DEXTROSE 2-4 GM/100ML-% IV SOLN
2.0000 g | INTRAVENOUS | Status: AC
  Administered 2022-07-17 (×2): 2 g via INTRAVENOUS
  Filled 2022-07-17: qty 100

## 2022-07-17 MED ORDER — SODIUM CHLORIDE 0.45 % IV SOLN: INTRAVENOUS | Status: DC

## 2022-07-17 MED ORDER — CEFAZOLIN SODIUM 1 G IJ SOLR
INTRAMUSCULAR | Status: AC
  Filled 2022-07-17: qty 20

## 2022-07-17 MED ORDER — LACTATED RINGERS IV SOLN: INTRAVENOUS | Status: DC

## 2022-07-17 MED ORDER — BUPIVACAINE LIPOSOME 1.3 % IJ SUSP
INTRAMUSCULAR | Status: AC
  Filled 2022-07-17: qty 20

## 2022-07-17 MED ORDER — HYDROCODONE-ACETAMINOPHEN 5-325 MG PO TABS: 1.0000 | ORAL_TABLET | Freq: Four times a day (QID) | ORAL | 0 refills | Status: DC | PRN

## 2022-07-17 MED ORDER — AMISULPRIDE (ANTIEMETIC) 5 MG/2ML IV SOLN: 10.0000 mg | Freq: Once | INTRAVENOUS | Status: DC | PRN

## 2022-07-17 MED ORDER — DEXAMETHASONE SODIUM PHOSPHATE 10 MG/ML IJ SOLN
INTRAMUSCULAR | Status: AC
  Filled 2022-07-17: qty 1

## 2022-07-17 MED ORDER — ALBUMIN HUMAN 5 % IV SOLN: INTRAVENOUS | Status: DC | PRN

## 2022-07-17 MED ORDER — LACTATED RINGERS IV SOLN: INTRAVENOUS | Status: DC | PRN

## 2022-07-17 MED ORDER — ONDANSETRON HCL 4 MG/2ML IJ SOLN: 4.0000 mg | INTRAMUSCULAR | Status: DC | PRN

## 2022-07-17 MED ORDER — ONDANSETRON HCL 4 MG/2ML IJ SOLN
INTRAMUSCULAR | Status: DC | PRN
  Administered 2022-07-17: 4 mg via INTRAVENOUS

## 2022-07-17 MED ORDER — PHENYLEPHRINE HCL-NACL 20-0.9 MG/250ML-% IV SOLN
INTRAVENOUS | Status: DC | PRN
  Administered 2022-07-17: 40 ug/min via INTRAVENOUS

## 2022-07-17 MED ORDER — ROCURONIUM BROMIDE 100 MG/10ML IV SOLN
INTRAVENOUS | Status: DC | PRN
  Administered 2022-07-17: 60 mg via INTRAVENOUS
  Administered 2022-07-17: 40 mg via INTRAVENOUS

## 2022-07-17 MED ORDER — FENTANYL CITRATE PF 50 MCG/ML IJ SOSY
25.0000 ug | PREFILLED_SYRINGE | INTRAMUSCULAR | Status: DC | PRN
  Administered 2022-07-17 (×3): 50 ug via INTRAVENOUS

## 2022-07-17 MED ORDER — SUGAMMADEX SODIUM 200 MG/2ML IV SOLN
INTRAVENOUS | Status: DC | PRN
  Administered 2022-07-17: 200 mg via INTRAVENOUS

## 2022-07-17 MED ORDER — LIDOCAINE HCL (PF) 2 % IJ SOLN
INTRAMUSCULAR | Status: AC
  Filled 2022-07-17: qty 5

## 2022-07-17 MED ORDER — ONDANSETRON HCL 4 MG/2ML IJ SOLN
INTRAMUSCULAR | Status: AC
  Filled 2022-07-17: qty 2

## 2022-07-17 MED ORDER — SODIUM CHLORIDE 0.9% FLUSH
INTRAVENOUS | Status: DC | PRN
  Administered 2022-07-17: 50 mL

## 2022-07-17 MED ORDER — LIDOCAINE HCL (CARDIAC) PF 100 MG/5ML IV SOSY
PREFILLED_SYRINGE | INTRAVENOUS | Status: DC | PRN
  Administered 2022-07-17: 100 mg via INTRAVENOUS

## 2022-07-17 MED ORDER — MIDAZOLAM HCL 2 MG/2ML IJ SOLN
INTRAMUSCULAR | Status: AC
  Filled 2022-07-17: qty 2

## 2022-07-17 MED ORDER — FENTANYL CITRATE PF 50 MCG/ML IJ SOSY
PREFILLED_SYRINGE | INTRAMUSCULAR | Status: AC
  Filled 2022-07-17: qty 2

## 2022-07-17 MED ORDER — KETAMINE HCL 10 MG/ML IJ SOLN
INTRAMUSCULAR | Status: DC | PRN
  Administered 2022-07-17: 40 mg via INTRAVENOUS
  Administered 2022-07-17: 10 mg via INTRAVENOUS

## 2022-07-17 MED ORDER — BUPIVACAINE LIPOSOME 1.3 % IJ SUSP
INTRAMUSCULAR | Status: DC | PRN
  Administered 2022-07-17: 20 mL

## 2022-07-17 MED ORDER — HYOSCYAMINE SULFATE 0.125 MG SL SUBL: 0.1250 mg | SUBLINGUAL_TABLET | SUBLINGUAL | Status: DC | PRN

## 2022-07-17 MED ORDER — HYDROMORPHONE HCL 1 MG/ML IJ SOLN: 0.5000 mg | INTRAMUSCULAR | Status: DC | PRN

## 2022-07-17 MED ORDER — ACETAMINOPHEN 10 MG/ML IV SOLN
1000.0000 mg | Freq: Four times a day (QID) | INTRAVENOUS | Status: AC
  Administered 2022-07-17 – 2022-07-18 (×4): 1000 mg via INTRAVENOUS
  Filled 2022-07-17 (×4): qty 100

## 2022-07-17 MED ORDER — CHLORHEXIDINE GLUCONATE 0.12 % MT SOLN: 15.0000 mL | Freq: Once | OROMUCOSAL | Status: AC

## 2022-07-17 MED ORDER — DOCUSATE SODIUM 100 MG PO CAPS
100.0000 mg | ORAL_CAPSULE | Freq: Two times a day (BID) | ORAL | Status: DC
  Administered 2022-07-17 – 2022-07-18 (×3): 100 mg via ORAL
  Filled 2022-07-17 (×3): qty 1

## 2022-07-17 MED ORDER — OXYCODONE HCL 5 MG PO TABS: 5.0000 mg | ORAL_TABLET | ORAL | Status: DC | PRN

## 2022-07-17 MED ORDER — 0.9 % SODIUM CHLORIDE (POUR BTL) OPTIME
TOPICAL | Status: DC | PRN
  Administered 2022-07-17: 1000 mL

## 2022-07-17 MED ORDER — ORAL CARE MOUTH RINSE
15.0000 mL | Freq: Once | OROMUCOSAL | Status: AC
  Administered 2022-07-17: 15 mL via OROMUCOSAL

## 2022-07-17 MED ORDER — MAGNESIUM CITRATE PO SOLN: 1.0000 | Freq: Once | ORAL | Status: DC

## 2022-07-17 MED ORDER — OXYCODONE HCL 5 MG/5ML PO SOLN: 5.0000 mg | Freq: Once | ORAL | Status: DC | PRN

## 2022-07-17 MED ORDER — LIDOCAINE 2% (20 MG/ML) 5 ML SYRINGE
INTRAMUSCULAR | Status: DC | PRN
  Administered 2022-07-17: 1.5 mg/kg/h via INTRAVENOUS

## 2022-07-17 SURGICAL SUPPLY — 102 items
ADH SKN CLS APL DERMABOND .7 (GAUZE/BANDAGES/DRESSINGS) ×2
AGENT HMST KT MTR STRL THRMB (HEMOSTASIS)
APL ESCP 34 STRL LF DISP (HEMOSTASIS)
APL PRP STRL LF DISP 70% ISPRP (MISCELLANEOUS) ×2
APPLICATOR SURGIFLO ENDO (HEMOSTASIS) IMPLANT
BAG COUNTER SPONGE SURGICOUNT (BAG) IMPLANT
BAG DRN RND TRDRP ANRFLXCHMBR (UROLOGICAL SUPPLIES) ×2
BAG SPNG CNTER NS LX DISP (BAG)
BAG URINE DRAIN 2000ML AR STRL (UROLOGICAL SUPPLIES) ×2 IMPLANT
BAG URO CATCHER STRL LF (MISCELLANEOUS) ×2 IMPLANT
BASKET ZERO TIP NITINOL 2.4FR (BASKET) IMPLANT
BSKT STON RTRVL ZERO TP 2.4FR (BASKET)
CATH FOLEY 3WAY  5CC 18FR (CATHETERS)
CATH FOLEY 3WAY 30CC 22FR (CATHETERS) ×2 IMPLANT
CATH FOLEY 3WAY 5CC 18FR (CATHETERS) IMPLANT
CATH TIEMANN FOLEY 18FR 5CC (CATHETERS) IMPLANT
CATH URETL OPEN END 6FR 70 (CATHETERS) IMPLANT
CHLORAPREP W/TINT 26 (MISCELLANEOUS) ×2 IMPLANT
CLIP LIGATING HEM O LOK PURPLE (MISCELLANEOUS) ×2 IMPLANT
CLIP LIGATING HEMO LOK XL GOLD (MISCELLANEOUS) IMPLANT
CLOTH BEACON ORANGE TIMEOUT ST (SAFETY) ×2 IMPLANT
COVER SURGICAL LIGHT HANDLE (MISCELLANEOUS) ×2 IMPLANT
COVER TIP SHEARS 8 DVNC (MISCELLANEOUS) ×2 IMPLANT
COVER TIP SHEARS 8MM DA VINCI (MISCELLANEOUS) ×2
DERMABOND ADVANCED .7 DNX12 (GAUZE/BANDAGES/DRESSINGS) ×2 IMPLANT
DRAIN CHANNEL 15F RND FF 3/16 (WOUND CARE) ×2 IMPLANT
DRAIN CHANNEL RND F F (WOUND CARE) IMPLANT
DRAPE ARM DVNC X/XI (DISPOSABLE) ×8 IMPLANT
DRAPE COLUMN DVNC XI (DISPOSABLE) ×2 IMPLANT
DRAPE DA VINCI XI ARM (DISPOSABLE) ×8
DRAPE DA VINCI XI COLUMN (DISPOSABLE) ×2
DRAPE INCISE IOBAN 66X45 STRL (DRAPES) ×2 IMPLANT
DRAPE SHEET LG 3/4 BI-LAMINATE (DRAPES) ×4 IMPLANT
DRAPE SURG IRRIG POUCH 19X23 (DRAPES) ×2 IMPLANT
ELECT REM PT RETURN 15FT ADLT (MISCELLANEOUS) ×2 IMPLANT
EVACUATOR SILICONE 100CC (DRAIN) ×2 IMPLANT
GLOVE BIO SURGEON STRL SZ 6.5 (GLOVE) ×2 IMPLANT
GLOVE SURG LX STRL 7.5 STRW (GLOVE) ×6 IMPLANT
GOWN SRG XL LVL 4 BRTHBL STRL (GOWNS) ×2 IMPLANT
GOWN STRL NON-REIN XL LVL4 (GOWNS) ×2
GOWN STRL REUS W/ TWL XL LVL3 (GOWN DISPOSABLE) ×2 IMPLANT
GOWN STRL REUS W/TWL XL LVL3 (GOWN DISPOSABLE) ×6
GUIDEWIRE ANG ZIPWIRE 038X150 (WIRE) ×2 IMPLANT
GUIDEWIRE STR DUAL SENSOR (WIRE) IMPLANT
HOLDER FOLEY CATH W/STRAP (MISCELLANEOUS) ×2 IMPLANT
IRRIG SUCT STRYKERFLOW 2 WTIP (MISCELLANEOUS) ×2
IRRIGATION SUCT STRKRFLW 2 WTP (MISCELLANEOUS) ×2 IMPLANT
KIT BASIN OR (CUSTOM PROCEDURE TRAY) ×2 IMPLANT
KIT TURNOVER KIT A (KITS) IMPLANT
LEGGING LITHOTOMY PAIR STRL (DRAPES) IMPLANT
LOOP VESSEL MAXI BLUE (MISCELLANEOUS) ×2 IMPLANT
MANIFOLD NEPTUNE II (INSTRUMENTS) ×2 IMPLANT
NDL INSUFFLATION 14GA 120MM (NEEDLE) ×2 IMPLANT
NDL SAFETY ECLIP 18X1.5 (MISCELLANEOUS) ×2 IMPLANT
NEEDLE INSUFFLATION 14GA 120MM (NEEDLE) ×2 IMPLANT
NS IRRIG 1000ML POUR BTL (IV SOLUTION) ×2 IMPLANT
PACK CYSTO (CUSTOM PROCEDURE TRAY) ×2 IMPLANT
PAD POSITIONING PINK XL (MISCELLANEOUS) IMPLANT
PENCIL SMOKE EVACUATOR (MISCELLANEOUS) IMPLANT
PORT ACCESS TROCAR AIRSEAL 12 (TROCAR) ×2 IMPLANT
RETAINER VISCERA MED (MISCELLANEOUS) IMPLANT
SEAL UNIV 5-12 XI (MISCELLANEOUS) ×6 IMPLANT
SEAL XI UNIVERSAL 5-12 (MISCELLANEOUS) ×6
SET IRRIG Y TYPE TUR BLADDER L (SET/KITS/TRAYS/PACK) IMPLANT
SET TRI-LUMEN FLTR TB AIRSEAL (TUBING) ×2 IMPLANT
SHEET LAVH (DRAPES) IMPLANT
SOL ELECTROSURG ANTI STICK (MISCELLANEOUS) ×2
SOLUTION ELECTROSURG ANTI STCK (MISCELLANEOUS) ×2 IMPLANT
SPIKE FLUID TRANSFER (MISCELLANEOUS) ×2 IMPLANT
SPONGE T-LAP 18X18 ~~LOC~~+RFID (SPONGE) IMPLANT
SPONGE T-LAP 4X18 ~~LOC~~+RFID (SPONGE) ×2 IMPLANT
STENT URET 6FRX24 CONTOUR (STENTS) IMPLANT
SURGIFLO W/THROMBIN 8M KIT (HEMOSTASIS) IMPLANT
SUT ETHILON 3 0 FSL (SUTURE) ×2 IMPLANT
SUT MNCRL AB 4-0 PS2 18 (SUTURE) ×4 IMPLANT
SUT PDS AB 0 CT1 36 (SUTURE) IMPLANT
SUT PDS AB 2-0 CT2 27 (SUTURE) ×4 IMPLANT
SUT VIC AB 0 CT1 27 (SUTURE)
SUT VIC AB 0 CT1 27XBRD ANTBC (SUTURE) IMPLANT
SUT VIC AB 2-0 CT2 27 (SUTURE) IMPLANT
SUT VIC AB 2-0 SH 27 (SUTURE)
SUT VIC AB 2-0 SH 27X BRD (SUTURE) IMPLANT
SUT VIC AB 3-0 SH 27 (SUTURE)
SUT VIC AB 3-0 SH 27X BRD (SUTURE) IMPLANT
SUT VIC AB 4-0 RB1 18 (SUTURE) IMPLANT
SUT VIC AB 4-0 RB1 27 (SUTURE)
SUT VIC AB 4-0 RB1 27XBRD (SUTURE) IMPLANT
SUT VIC AB 4-0 SH 18 (SUTURE) IMPLANT
SUT VICRYL 0 UR6 27IN ABS (SUTURE) IMPLANT
SUT VLOC 180 2-0 6IN GS21 (SUTURE) IMPLANT
SUT VLOC BARB 180 ABS3/0GR12 (SUTURE)
SUTURE VLOC BRB 180 ABS3/0GR12 (SUTURE) IMPLANT
SYS BAG RETRIEVAL 10MM (BASKET) ×2
SYSTEM BAG RETRIEVAL 10MM (BASKET) IMPLANT
TOWEL OR 17X26 10 PK STRL BLUE (TOWEL DISPOSABLE) ×2 IMPLANT
TRAY LAPAROSCOPIC (CUSTOM PROCEDURE TRAY) ×2 IMPLANT
TROCAR ADV FIXATION 12X100MM (TROCAR) ×2 IMPLANT
TROCAR Z-THREAD FIOS 5X100MM (TROCAR) ×2 IMPLANT
TUBE PU 8FR 16IN ENFIT (TUBING) IMPLANT
TUBING CONNECTING 10 (TUBING) ×2 IMPLANT
TUBING UROLOGY SET (TUBING) IMPLANT
WATER STERILE IRR 1000ML POUR (IV SOLUTION) ×2 IMPLANT

## 2022-07-17 NOTE — Progress Notes (Signed)
Pt was unable to ambulate in the hallway at this time, she complained of dizziness, and  feeling like she was going to pass out. She was able to sit at the edge of the bed and dangle her feet. We will try again in the am, per the patient. Plan of  care on going at this time.

## 2022-07-17 NOTE — Transfer of Care (Signed)
Immediate Anesthesia Transfer of Care Note  Patient: [name]  Procedure(s) Performed: XI ROBOTICALLY ASSISTED LAPAROSCOPIC BILATERAL URETEROLYSIS, RIGHT PERIURETERAL MASS REMOVAL (Bilateral) CYSTOSCOPY WITH RETROGRADE PYELOGRAM/URETERAL STENT PLACEMENT (Bilateral) XI ROBOTIC ASSISTED LEFT OOPHORECTOMY (Left)  Patient Location: PACU  Anesthesia Type:General  Level of Consciousness: sedated and patient cooperative  Airway & Oxygen Therapy: Patient Spontanous Breathing and Patient connected to face mask oxygen  Post-op Assessment: Report given to RN and Post -op Vital signs reviewed and stable  Post vital signs: Reviewed  Last Vitals:  Vitals Value Taken Time  BP 179/98 07/17/22 1715  Temp    Pulse 79 07/17/22 1719  Resp 16 07/17/22 1719  SpO2 100 % 07/17/22 1719  Vitals shown include unvalidated device data.  Last Pain:  Vitals:   07/17/22 1110  TempSrc:   PainSc: 0-No pain         Complications: No notable events documented.

## 2022-07-17 NOTE — Op Note (Unsigned)
[name], BRAMANTE MEDICAL RECORD NO: [id number] ACCOUNT NO: [id number] DATE OF BIRTH: [birth year] FACILITY: [facility] LOCATION: [id number] PHYSICIAN: Alexis Frock, MD  Operative Report   PREOPERATIVE DIAGNOSIS:  Bilateral periureteral masses with atrophic left kidney.  PROCEDURES PERFORMED: 1.  Cystoscopy with bilateral retrograde pyelograms interpretation. 2.  Insertion of bilateral ureteral stents. 3.  Robotic-assisted laparoscopic bilateral ureterolysis and right periureteral mass excision. 4.  Left oophorectomy.  ESTIMATED BLOOD LOSS:  [id number] mL  COMPLICATIONS:  None.  SPECIMENS:  1.  Right periureteral mass with gonadal vein segment for permanent pathology. 2.  Left ovary with gonadal vein segment for permanent pathology.  FINDINGS:  1.  Relatively unremarkable right retrograde pyelogram.  No obvious filling defects, narrowing or hydronephrosis. 2.  Successful placement of right ureteral stent. 3.  Left retrograde pyelogram with significant hydronephrosis and high-grade obstruction of the mid ureter by retrograde pyelogram. 4.  Successful placement of left ureteral stent, proximal end in renal pelvis and distal end in urinary bladder. 5.  Right periureteral mass appeared contiguous with the right gonadal vein stump. No direct encasement or invasion of ureter.  This was grossly removed completely. 6.  Severe high-grade desmoplastic reaction of left periureteral mass encasing the left ureter completely, also with significant mass effect on sigmoid colon and encasing the left common iliac artery.  This was unresectable. 7.  Grossly unremarkable left ovary, but this structure did appear contiguous with the left gonadal vein which was also contiguous with the left periureteral mass.  Therefore, decision made to resect given perimenopausal status and goal of maximal  histology and to guide future therapeutic guidance.  COSURGEON: Armandina Gemma, MD, for intraoperative consultation and  opinion.  ASSISTANT:  Debbrah Alar, PA  DRAINS:  Foley catheter to straight drain.  INDICATIONS:  The patient is a pleasant, but unfortunate 57 year old lady with remote history of benign hysterectomy.  She was found to have bilateral periureteral masses late last year by one of my colleagues.  She underwent endoscopic exam, which  revealed predominantly extrinsic compression on her left kidney via the ureter but no obvious intraluminal mass.  There was significant left renal atrophy noted and IR biopsy favored very unusual leiomyoma as etiology, renogram showed approximately 11% left renal  function.  She has been temporized with left stent.  She was internally referred for consideration of resection with goal of preservation of right renal function and hopeful removal of bilateral neoplasms for maximal oncologic benefit.  Imaging was  carefully reviewed and counseled the patient towards bilateral robotic approach periureteral mass excision with stenting versus possible left nephroureterectomy pending intraoperative findings and she wished to proceed.  Informed consent was obtained and  placed in medical record.  PROCEDURE IN DETAIL:  The patient being verified, procedure being cystoscopy, bilateral stents, robotic bilateral periureteral neoplasm excision was confirmed.  Procedure timeout was performed.  Intravenous antibiotics were administered.  General  endotracheal anesthesia induced.  The patient was placed into a low lithotomy position.  Arms were tucked to her side with gel roll.  Sequential compression devices placed.  She was further fastened to operating table using 3-inch tape over foam padding  across supraxiphoid chest.  Test of steep Trendelenburg positioning was performed and found to be suitably positioned.  She was then made level again. Sterile field was created, prepped and draped the patient's vagina, introitus, and proximal thighs  using iodine and cystoscopy drapes were applied.   Cystourethroscopy was performed using 21-French rigid cystoscope with offset lens.  Inspection  of urinary bladder revealed the distal end of left ureteral stent in situ.  There was minimal encrustation.   It was grasped, brought to the level of the urethral meatus, exchanged for open-ended catheter via a sensor wire and left retrograde pyelogram was obtained.  Left retrograde pyelogram demonstrated single left ureter, single system left kidney.  There was moderate hydronephrosis to a segment of high-grade extrinsic compression and appeared to be approximately 3 to 4 cm in length close to the mid ureter as  anticipated.  No additional foci of obstruction were noted and a new 6 x 24 contour type stent was carefully placed.  Good proximal and distal planes were noted.  Next, right retrograde pyelogram was obtained.  Right retrograde pyelogram demonstrated single right ureter, single system right kidney.  No obvious filling defects or narrowing noted.  A sensor wire was advanced and a new 6 x 24 contour type stent was placed using cystoscopic and fluoroscopic  guidance.  Good proximal and distal planes were noted.  The cysto drapes were taken down and a new sterile field was created by prepping and draping the patient's entire infra-xiphoid abdomen using chlorhexidine gluconate and her vagina, introitus, and  proximal thighs using iodine.  An LAVH type drape was carefully placed.  Foley catheter was placed per urethra to straight drain.  Next, a high flow, low pressure pneumoperitoneum was obtained using Veress technique in the supraumbilical midline,  approximately 4 cm superior to the umbilicus well away from her prior infraumbilical incision for hysterectomy. Laparoscopic examination of the peritoneal cavity revealed some loose omental adhesions in the infraumbilical midline. Additional ports were  placed as follows:  Right paramedian 8 mm robotic port, right paramedian 5 mm suction port, right far lateral  12 mm AirSeal assist port, left paramedian 8 mm robotic port, left far lateral 8 mm robotic port.  The loose omental adhesions in the midline  were taken down using laparoscopic cautery scissors.  Robot was then docked.  Attention was initially directed at right retroperitoneal dissection.  Incision was made in the right posterior peritoneum in the area of the presumed iliac crossing, distally  coursing just lateral to the right medial umbilical ligament and then proximally coursing lateral to the ascending colon, lateral to the umbilicus and cecum for a distance approximately 18 cm above the iliac crossing.  This created a large  retroperitoneal flap, which was carefully mobilized medially.  The right ureter was encountered as it crossed the iliac vessels, this was marked with vessel loop, dissected distally for a distance of approximately 8 cm below the iliac crossing and then  proximally, at a distance of approximately 4-5 cm above the iliac crossing, the periureteral neoplasm was clearly visualized.  This was somewhat adherent to but not directly invading the left ureter as anticipated by her prior endoscopic exam and  retrograde pyelography and very careful dissection was performed, separating the mass away from the ureter and again the plane was quite favorable as there had not been yet encasement or significant mass effect. This structure did appear to be contiguous  with the right gonadal vein, which was triply clipped and ligated and the right periureteral mass was placed into a small EndoCatch bag for later retrieval.  This completely resolved the area of impending mass effect on the right side.  I was quite  happy with the safety and completeness of the resection of the right sided periureteral mass.  We achieved the principal goal of removing impending obstruction  of her dominant kidney.  Attention was then directed at the left sided retroperitoneal  dissection.  Incision was made in the  posterior peritoneum in the area of the iliac crossing superiorly for a distance of approximately 18 cm and then inferiorly coursing just lateral to the left medial umbilical ligament.  This created a large  retroperitoneal flap, was carefully mobilized medially. On the left side, there was significant more desmoplasia in the area of the iliac vessels. The left ovary was still in situ and mobilized somewhat but at this point still kept on its gonadal  vascular stump. Left ureter was encountered as it coursed the iliac vessels, marked with vessel loop, dissected distally for a distance approximately 6 cm and then dissection proceeded proximally to a position  approximately 2 cm above the iliac crossing, the  ureter entered an incredibly dense and desmoplastic mass, which completely encased it.  This was completely firm and rigid with minimal mobility.  This mass appeared to be causing tethering of the proximal sigmoid without any proximal dilation to suggest  any sigmoid obstruction.  However, the colon was clearly very intimately involved with the process as well.  The psoas musculature was clearly identified.  An attempt was made to identify the ureter proximal to this neoplastic process, however, was not  easily identifiable above it. At this point, it became immediately clear that this neoplasm would not be amenable to safe robotic resection.  I contemplated transitioning the patient to a lateral position and proceeding with robotic nephrectomy. However,  this would have not done anything for the neoplastic process.  This kidney already has minimal function and is now stented. As such I felt the more prudent maneuver would be which is converting to open procedure through open midline incision of significant  length, which would then allow better access and palpation of the area of neoplasia to assess resectability.  To query its true resectability and also allow access to the area of the left kidney,  decision made to proceed with nephrectomy. As such, robot was undocked.  Incision  was made in the midline inferiorly from the camera port, erring to the left side of the umbilicus to position approximately 2 fingerbreadths superior to the pubic ramus directly in the area of her prior infraumbilical incision and then superiorly to a  distance of approximately 3 fingerbreadths inferior to the xiphoid.  An Omni-Tract type retractor was carefully placed.  The patient was placed in approximately 10 degrees of head down position.  Attention was directed at further visualization and  characterization of the left sided retroperitoneum and mass.  Bowel was tucked out of the way very carefully with moist laps and we had excellent visualization of the area of neoplasm, again inspected the ureter and corroborated the open visualization  that entered and was completely encased in this very dense neoplastic process and palpably this process was rock hard and also encased a significant portion of the left common iliac vessels and there was no palpable plane between these vital structures.  Process was also quite large, at least 6 x 8 cm.  The process also seemed to be directly involving area very close to the sigmoid colon, but not directly invading. It was felt that in order to resect this, this would clearly require at least segmental  colonic resection and possibly major vascular reconstruction of the iliac vessels and even then, I felt that the chances of achieving a zero margin resection were essentially zero.  I  requested intraoperative opinion from my general surgery colleague,  Dr. Harlow Asa, who graciously agreed to also examine the area of question. He further inspected, especially the area of the neoplasm and colon interface and felt that indeed it would require at a minimum resection with end colostomy as the patient was not  completely prepped for this and likely result in significant distal ischemia as the area  of question likely encompasses the IMA.  We both agreed that the chances of achieving a zero margin resection were nil but the chances of major vascular injury were  quite high. As we achieved the goals of preservation of her right renal unit, exchanging the stent on the left, I felt that the most prudent means of management would be to abandon the extirpated portion of procedure today, leave the left kidney in situ  as again I find no role or extensive benefit to proceeding with nephrectomy as there will be no way to remove the associated mass.  I discussed options with the patient's husband, Edd Arbour, by phone, including proceeding with heroic resection, again  explaining that this would require at a minimum an end colostomy, possibly vascular reconstruction and I was quite concerned about the risk of major vascular injury versus stopping the extirpated portion of the procedure today and he agreed that the  patient's desires would be most in line with stopping the extirpated portion, as such vessel loops in the ureters were removed.  All laps were removed.  Sponge counts were correct.  Omni-Tract was taken down.  A FISH type bowel protector was placed in  the upper abdomen and the large midline incision was reapproximated using figure-of-eight PDS times approximately 25. Scarpa's reapproximated using running Vicryl.  All incision sites were infiltrated with dilute lipolyzed Marcaine and closed at the  level of skin using subcuticular Monocryl followed by Dermabond.  Procedure was terminated.  The patient tolerated the procedure well, no immediate perioperative complications.  The patient was taken to postanesthesia care unit in stable condition.  Plan  for inpatient admission.  Please note, first assistant, Debbrah Alar, was crucial for all portions of the surgery today.  She provided invaluable retraction, suctioning, vascular clipping, robotic instrument exchange and general first assistance.  Please  note, before closing, given the mobilization of the left ovary and her perimenopausal status, I elected to perform left oophorectomy by triply clipping the left gonadal vessels, removing, as this structure did appear to be contiguous with the  retroperitoneal mass and I was optimistic this will provide further histologic data to guide further therapy.   SHW D: 07/17/2022 5:14:22 pm T: 07/17/2022 10:08:00 pm  JOB: [id number]

## 2022-07-17 NOTE — Anesthesia Preprocedure Evaluation (Addendum)
Anesthesia Evaluation  Patient identified by MRN, date of birth, ID band Patient awake    Reviewed: Allergy & Precautions, NPO status , Patient's Chart, lab work & pertinent test results  History of Anesthesia Complications Negative for: history of anesthetic complications  Airway Mallampati: II  TM Distance: >3 FB Neck ROM: Full    Dental no notable dental hx.    Pulmonary neg pulmonary ROS   Pulmonary exam normal        Cardiovascular hypertension, Pt. on medications Normal cardiovascular exam     Neuro/Psych negative neurological ROS  negative psych ROS   GI/Hepatic negative GI ROS, Neg liver ROS,,,  Endo/Other  negative endocrine ROS    Renal/GU Renal InsufficiencyRenal disease   BILATERAL URETERAL MASSES    Musculoskeletal negative musculoskeletal ROS (+)    Abdominal   Peds  Hematology negative hematology ROS (+)   Anesthesia Other Findings Day of surgery medications reviewed with patient.  Reproductive/Obstetrics negative OB ROS                              Anesthesia Physical Anesthesia Plan  ASA: 2  Anesthesia Plan: General   Post-op Pain Management: Tylenol PO (pre-op)*   Induction:   PONV Risk Score and Plan: 4 or greater and Treatment may vary due to age or medical condition, Midazolam, Dexamethasone, Ondansetron, Propofol infusion and Scopolamine patch - Pre-op  Airway Management Planned: Oral ETT  Additional Equipment: None  Intra-op Plan:   Post-operative Plan: Extubation in OR  Informed Consent: I have reviewed the patients History and Physical, chart, labs and discussed the procedure including the risks, benefits and alternatives for the proposed anesthesia with the patient or authorized representative who has indicated his/her understanding and acceptance.     Dental advisory given  Plan Discussed with: CRNA  Anesthesia Plan Comments:          Anesthesia Quick Evaluation

## 2022-07-17 NOTE — H&P (Signed)
[name] is an 57 y.o. female.    Chief Complaint: Pre-Op BILTERAL uretero-ureterostomy / Peri-Ureteral Tumor Resection, Possible Lt Nephrectomy  HPI:   1 - BIlateral Peri-Ureteral Tumors with Obstruction - Very rare L>R peri-uretreral neoplasms on eval left hydro late 2023. BX proven leimyoma. Some left hydro with parenchymal loss. DX uretreroscopy confirms non intraluminal IR BX 05/2022 path leiyomyoma. Renogram 2024 with Rt 88%, Lt 12% relative function. Cr 1.4 at baseline. Bilateral single artery / single vein renovascular anatomy.   2 - STage 3 REnal Insuficiency - Cr 1.4's, GFR 50 at baseline. Seom L>R obstruction as per above.   PMH sig for obesity, open infraumbilical midline hyst for large fibroids, HTN. No ischemic CV disesae / blood thinners. 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 Her PCP is Ria Bush MD.   Today " [name] " is seen to proceed with cysto, retrogrades, stents, and robotic removal of bilateral peri-ureteral tumors with reconstruction. NO interval fevers. Most recent UA without infectious parameters. Hgb 13.    Past Medical History:  Diagnosis Date   Allergy    Chronic kidney disease    COVID-19 virus infection 06/2019   Dermatofibrosarcoma protuberans 2014   Ectatic abdominal aorta ([place]) 01/2015   rpt [place] 5 yrs   Fatty liver 01/2015   by US   Gallbladder polyp 01/2015   61mm by [place]   Hypertension    Iron deficiency    Seasonal allergies    Uterine fibroid 01/2015   markedly enlarged    Past Surgical History:  Procedure Laterality Date   COLONOSCOPY  10/2019   benign polyp, rpt 10 yrs ([place])   CYSTOSCOPY WITH RETROGRADE PYELOGRAM, URETEROSCOPY AND STENT PLACEMENT Bilateral 04/20/2022   Procedure: CYSTOSCOPY WITH BILATERAL RETROGRADE PYELOGRAM, LEFT URETEROSCOPY AND STENT PLACEMENT;  Surgeon: Lucas Mallow, MD;  Location: [facility];  Service: Urology;  Laterality: Bilateral;   1 HR FOR CASE   ENDOMETRIAL ABLATION  05/04/2008   fibroids   HYSTERECTOMY ABDOMINAL WITH SALPINGECTOMY Bilateral 07/22/2017   Procedure: HYSTERECTOMY ABDOMINAL WITH SALPINGECTOMY;  Surgeon: Molli Posey, MD;  Location: [facility];  Service: Gynecology;  Laterality: Bilateral;   MASS EXCISION     lower abdomen dermatofibrosarcoma ([facility])   OOPHORECTOMY Right 07/22/2017   Procedure: OOPHORECTOMY;  Surgeon: Molli Posey, MD;  Location: [facility];  Service: Gynecology;  Laterality: Right;   TUBAL LIGATION  05/05/1995    Family History  Problem Relation Age of Onset   Diabetes Mother    Hypertension Mother    Diabetes Father    Hypertension Father    Cancer Paternal Aunt 55       breast   Stroke Neg Hx    Coronary artery disease Neg Hx    Colon cancer Neg Hx    Esophageal cancer Neg Hx    Rectal cancer Neg Hx    Stomach cancer Neg Hx    Social History:  reports that she has never smoked. She has never used smokeless tobacco. She reports that she does not drink alcohol and does not use drugs.  Allergies: No Known Allergies  No medications prior to admission.    No results found for this or any previous visit (from the past 48 hour(s)). No results found.  Review of Systems  Constitutional:  Negative for chills and fever.  All other systems reviewed and are negative.   Last menstrual period 06/25/2017. Physical Exam  Vitals reviewed.  HENT:     Head: Normocephalic.     Nose: Nose normal.  Cardiovascular:     Rate and Rhythm: Normal rate.  Pulmonary:     Effort: Pulmonary effort is normal.  Abdominal:     General: Abdomen is flat.     Comments: Stable truncal obesity  Genitourinary:    Comments: No CVAT at present Musculoskeletal:        General: Normal range of motion.     Cervical back: Normal range of motion.  Neurological:     General: No focal deficit present.     Mental Status: She is alert.  Psychiatric:        Mood and Affect: Mood normal.       Assessment/Plan  Proceed as planned with BILATERAL removal of peri-ureteral tumors and reconstruction.Cysto, retrograde, stent exchange first thing, then Plan A is removal of tumors and possible uretero-ureterostomy (start with Rt side as functionally dominant). If left peri-ureteral mass too extensive / reconstruction not possible, then left neph-U with removal of left mass as well as left hilar node dissection as some shoddy nodes in that area. Burtis Junes trying to keep left renal unit if possible given very unusual clinical situation and goal of max renal preservation especially should there ever be recurrence.   Risks, benefits, alternatives (chronic stenting), expected peri-op course discussed previously and reiterated today.  Alexis Frock, MD 07/17/2022, 7:37 AM

## 2022-07-17 NOTE — Anesthesia Postprocedure Evaluation (Signed)
Anesthesia Post Note  Patient: [name]  Procedure(s) Performed: XI ROBOTICALLY ASSISTED LAPAROSCOPIC BILATERAL URETEROLYSIS, RIGHT PERIURETERAL MASS REMOVAL (Bilateral) CYSTOSCOPY WITH RETROGRADE PYELOGRAM/URETERAL STENT PLACEMENT (Bilateral) XI ROBOTIC ASSISTED LEFT OOPHORECTOMY (Left)     Patient location during evaluation: PACU Anesthesia Type: General Level of consciousness: awake and alert Pain management: pain level controlled Vital Signs Assessment: post-procedure vital signs reviewed and stable Respiratory status: spontaneous breathing, nonlabored ventilation and respiratory function stable Cardiovascular status: blood pressure returned to baseline Postop Assessment: no apparent nausea or vomiting Anesthetic complications: no   No notable events documented.  Last Vitals:  Vitals:   07/17/22 1730 07/17/22 1745  BP: (!) 151/93 (!) 162/98  Pulse: 78 80  Resp: 16 13  Temp:    SpO2: 100% 100%    Last Pain:  Vitals:   07/17/22 1753  TempSrc:   PainSc: Asleep                 Marthenia Rolling

## 2022-07-17 NOTE — Discharge Instructions (Signed)

## 2022-07-17 NOTE — Anesthesia Procedure Notes (Signed)
Procedure Name: Intubation Date/Time: 07/17/2022 12:52 PM  Performed by: Timoteo Expose, CRNAPre-anesthesia Checklist: Patient identified, Emergency Drugs available, Suction available and Patient being monitored Patient Re-evaluated:Patient Re-evaluated prior to induction Oxygen Delivery Method: Circle system utilized Preoxygenation: Pre-oxygenation with 100% oxygen Induction Type: IV induction Ventilation: Mask ventilation without difficulty Laryngoscope Size: Mac and 3 Grade View: Grade I Tube type: Oral Tube size: 7.0 mm Number of attempts: 1 Airway Equipment and Method: Stylet and Oral airway Placement Confirmation: ETT inserted through vocal cords under direct vision, positive ETCO2 and breath sounds checked- equal and bilateral Secured at: 21 cm Tube secured with: Tape Dental Injury: Teeth and Oropharynx as per pre-operative assessment

## 2022-07-17 NOTE — Brief Op Note (Signed)
07/17/2022  4:53 PM  PATIENT:  [name]  57 y.o. female  PRE-OPERATIVE DIAGNOSIS:  BILATERAL URETERAL MASSES  POST-OPERATIVE DIAGNOSIS:  BILATERAL URETERAL MASSES  PROCEDURE:  Procedure(s) with comments: XI ROBOTICALLY ASSISTED LAPAROSCOPIC BILATERAL URETEROLYSIS, RIGHT PERIURETERAL MASS REMOVAL (Bilateral) - 4 HRS CYSTOSCOPY WITH RETROGRADE PYELOGRAM/URETERAL STENT PLACEMENT (Bilateral) XI ROBOTIC ASSISTED LEFT OOPHORECTOMY (Left)  SURGEON:  Surgeon(s) and Role:    * Alexis Frock, MD - Primary    * Armandina Gemma, MD  PHYSICIAN ASSISTANT:   ASSISTANTS: Debbrah Alar PA   ANESTHESIA:   local and general  EBL:  130 mL   BLOOD ADMINISTERED:none  DRAINS:  foley to gravity    LOCAL MEDICATIONS USED:  MARCAINE     SPECIMEN:  Source of Specimen:  Rt peri-ureteral mass + gonadal vein; Left ovary  DISPOSITION OF SPECIMEN:  PATHOLOGY  COUNTS:  YES  TOURNIQUET:  * No tourniquets in log *  DICTATION: .Other Dictation: Dictation Number [id number]  PLAN OF CARE: Admit to inpatient   PATIENT DISPOSITION:  PACU - hemodynamically stable.   Delay start of Pharmacological VTE agent (>24hrs) due to surgical blood loss or risk of bleeding: yes

## 2022-07-18 ENCOUNTER — Encounter (HOSPITAL_COMMUNITY): Payer: Self-pay | Admitting: Urology

## 2022-07-18 LAB — BASIC METABOLIC PANEL
Anion gap: 11 (ref 5–15)
BUN: 14 mg/dL (ref 6–20)
CO2: 26 mmol/L (ref 22–32)
Calcium: 9.1 mg/dL (ref 8.9–10.3)
Chloride: 97 mmol/L — ABNORMAL LOW (ref 98–111)
Creatinine, Ser: 1.37 mg/dL — ABNORMAL HIGH (ref 0.44–1.00)
GFR, Estimated: 46 mL/min — ABNORMAL LOW (ref >60)
Glucose, Bld: 130 mg/dL — ABNORMAL HIGH (ref 70–99)
Potassium: 4.5 mmol/L (ref 3.5–5.1)
Sodium: 134 mmol/L — ABNORMAL LOW (ref 135–145)

## 2022-07-18 LAB — HEMOGLOBIN AND HEMATOCRIT, BLOOD
HCT: 37.8 % (ref 36.0–46.0)
Hemoglobin: 12.5 g/dL (ref 12.0–15.0)

## 2022-07-18 MED ORDER — ACETAMINOPHEN 325 MG PO TABS
650.0000 mg | ORAL_TABLET | Freq: Four times a day (QID) | ORAL | Status: DC | PRN
  Administered 2022-07-18: 650 mg via ORAL
  Filled 2022-07-18: qty 2

## 2022-07-18 MED ORDER — ENOXAPARIN SODIUM 40 MG/0.4ML IJ SOSY
40.0000 mg | PREFILLED_SYRINGE | INTRAMUSCULAR | Status: DC
  Administered 2022-07-18: 40 mg via SUBCUTANEOUS
  Filled 2022-07-18: qty 0.4

## 2022-07-18 NOTE — Progress Notes (Signed)
Tolerated ambulating in the hallway with walker for 59ft.

## 2022-07-18 NOTE — Progress Notes (Signed)
Urology Inpatient Progress Report  Periurethral mass [N36.8]  Procedure(s): XI ROBOTICALLY ASSISTED LAPAROSCOPIC BILATERAL URETEROLYSIS, RIGHT PERIURETERAL MASS REMOVAL CYSTOSCOPY WITH RETROGRADE PYELOGRAM/URETERAL STENT PLACEMENT XI ROBOTIC ASSISTED LEFT OOPHORECTOMY  1 Day Post-Op   Intv/Subj: No acute events overnight. Patient is without complaint.  Pain well-controlled.  Ambulating a small amount.  Tolerating clears.  Is hungry.  No flatus  Principal Problem:   Periurethral mass  Current Facility-Administered Medications  Medication Dose Route Frequency Provider Last Rate Last Admin   0.45 % sodium chloride infusion   Intravenous Continuous Marton Redwood III, MD 75 mL/hr at 07/18/22 1004 New Bag at 07/18/22 1004   acetaminophen (OFIRMEV) IV 1,000 mg  1,000 mg Intravenous Q6H Debbrah Alar, PA-C 400 mL/hr at 07/18/22 0918 1,000 mg at 07/18/22 0918   amLODipine (NORVASC) tablet 5 mg  5 mg Oral BID Debbrah Alar, PA-C   5 mg at 07/18/22 0913   diphenhydrAMINE (BENADRYL) injection 12.5-25 mg  12.5-25 mg Intravenous Q6H PRN Debbrah Alar, PA-C       Or   diphenhydrAMINE (BENADRYL) 12.5 MG/5ML elixir 12.5-25 mg  12.5-25 mg Oral Q6H PRN Debbrah Alar, PA-C       docusate sodium (COLACE) capsule 100 mg  100 mg Oral BID Debbrah Alar, PA-C   100 mg at 07/18/22 0913   HYDROmorphone (DILAUDID) injection 0.5-1 mg  0.5-1 mg Intravenous Q2H PRN Debbrah Alar, PA-C       hyoscyamine (LEVSIN SL) SL tablet 0.125 mg  0.125 mg Sublingual Q4H PRN Dancy, Amanda, PA-C       menthol-cetylpyridinium (CEPACOL) lozenge 3 mg  1 lozenge Oral PRN Alexis Frock, MD       ondansetron [facility]) injection 4 mg  4 mg Intravenous Q4H PRN Debbrah Alar, PA-C       oxyCODONE (Oxy IR/ROXICODONE) immediate release tablet 5 mg  5 mg Oral Q4H PRN Debbrah Alar, PA-C         Objective: Vital: Vitals:   07/17/22 2100 07/18/22 0116 07/18/22 0658 07/18/22 0846  BP: (!) 166/92 (!) 157/94 (!) 140/80 (!) 156/79   Pulse: 97 98 100 92  Resp: 17 19 19 20   Temp: 98.1 F (36.7 C) 98.2 F (36.8 C) 98.1 F (36.7 C) 98.4 F (36.9 C)  TempSrc: Oral Oral Oral Oral  SpO2: 100% 98% 97% 98%  Weight:      Height:       I/Os: I/O last 3 completed shifts: In: 3050 [I.V.:2700; IV Piggyback:350] Out: 2430 [Urine:2300; Blood:130]  Physical Exam:  General: Patient is in no apparent distress Lungs: Normal respiratory effort, chest expands symmetrically. GI: Incisions are c/d/i. The abdomen is soft and nontender without mass. Foley: Draining clear yellow urine Ext: lower extremities symmetric  Lab Results: Recent Labs    07/17/22 1750 07/18/22 0358  HGB 15.0 12.5  HCT 47.0* 37.8   Recent Labs    07/18/22 0358  NA 134*  K 4.5  CL 97*  CO2 26  GLUCOSE 130*  BUN 14  CREATININE 1.37*  CALCIUM 9.1   No results for input(s): "LABPT", "INR" in the last 72 hours. No results for input(s): "LABURIN" in the last 72 hours. Results for orders placed or performed in visit on 06/12/19  Novel Coronavirus, NAA ([facility])     Status: Abnormal   Collection Time: 06/12/19 12:00 AM   Specimen: Nasopharyngeal(NP) swabs in vial transport medium   NASOPHARYNGE  TESTING  Result Value Ref Range Status   SARS-CoV-2, NAA Detected (A) Not Detected Final  Comment: This nucleic acid amplification test was developed and its performance characteristics determined by [employer]. Nucleic acid amplification tests include RT-PCR and TMA. This test has not been FDA cleared or approved. This test has been authorized by FDA under an Emergency Use Authorization (EUA). This test is only authorized for the duration of time the declaration that circumstances exist justifying the authorization of the emergency use of in vitro diagnostic tests for detection of SARS-CoV-2 virus and/or diagnosis of COVID-19 infection under section 564(b)(1) of the Act, 21 U.S.C. [id number]) (1), unless the authorization is terminated or  revoked sooner. When diagnostic testing is negative, the possibility of a false negative result should be considered in the context of a patient's recent exposures and the presence of clinical signs and symptoms consistent with COVID-19. An individual without symptoms of COVID-19 and who is not shedding SARS-CoV-2 virus wo uld expect to have a negative (not detected) result in this assay.     Studies/Results: DG C-Arm 1-60 Min-No Report  Result Date: 07/17/2022 Fluoroscopy was utilized by the requesting physician.  No radiographic interpretation.    Assessment: Periureteral masses status post: 1.  Cystoscopy with bilateral retrograde pyelograms interpretation. 2.  Insertion of bilateral ureteral stents. 3.  Robotic-assisted laparoscopic bilateral ureterolysis and right periureteral mass excision with conversion to exploratory laparotomy 4.  Left oophorectomy.  Plan: KVO IV fluids Ambulate halls Discontinue Foley catheter Advance to general diet Lovenox for DVT prophylaxis A.m. labs   Link Snuffer, MD Urology 07/18/2022, 10:56 AM

## 2022-07-18 NOTE — Progress Notes (Cosign Needed)
  Transition of Care [facility]) Screening Note   Patient Details  Name: [name] Date of Birth: 02/17/1966   [facility]) CM/SW Contact:    Henrietta Dine, RN Phone Number: 07/18/2022, 12:51 PM    Transition of Care Department [facility]) has reviewed patient and no TOC needs have been identified at this time. We will continue to monitor patient advancement through interdisciplinary progression rounds. If new patient transition needs arise, please place a TOC consult.

## 2022-07-19 LAB — BASIC METABOLIC PANEL
Anion gap: 9 (ref 5–15)
BUN: 19 mg/dL (ref 6–20)
CO2: 26 mmol/L (ref 22–32)
Calcium: 8.7 mg/dL — ABNORMAL LOW (ref 8.9–10.3)
Chloride: 104 mmol/L (ref 98–111)
Creatinine, Ser: 1.32 mg/dL — ABNORMAL HIGH (ref 0.44–1.00)
GFR, Estimated: 48 mL/min — ABNORMAL LOW (ref >60)
Glucose, Bld: 97 mg/dL (ref 70–99)
Potassium: 3.9 mmol/L (ref 3.5–5.1)
Sodium: 139 mmol/L (ref 135–145)

## 2022-07-19 LAB — HEMOGLOBIN AND HEMATOCRIT, BLOOD
HCT: 36.4 % (ref 36.0–46.0)
Hemoglobin: 11.6 g/dL — ABNORMAL LOW (ref 12.0–15.0)

## 2022-07-19 NOTE — Discharge Summary (Signed)
Physician Discharge Summary  Patient ID: [name] MRN: [id number] DOB/AGE: Sep 30, 1965 57 y.o.  Admit date: 07/17/2022 Discharge date: 07/19/2022  Admission Diagnoses:  Discharge Diagnoses:  Principal Problem:   Periurethral mass   Discharged Condition: good  Hospital Course: Patient underwent cystoscopy with bilateral ureteral stent placement, robotic bilateral ureterolysis with right periureteral mass excision with conversion to exploratory laparotomy.  Unable to excise the left Periureteral mass.  She had a left oophorectomy.  She tolerated the procedure well and stable postoperative.  Advance her diet and by postoperative day 2, she was tolerating general diet, ambulating well, pain well-controlled on Tylenol alone.  She was discharged in stable condition.  Consults: None  Significant Diagnostic Studies: None  Treatments: surgery: As above  Discharge Exam: Blood pressure 137/85, pulse 82, temperature 98.5 F (36.9 C), temperature source Oral, resp. rate 20, height 5\' 5"  (1.651 m), weight 94.4 kg, last menstrual period 06/25/2017, SpO2 95 %. General appearance: alert no acute distress Adequate perfusion of extremities Nonlabored respiration, normal oxygen saturation on room air Abdomen soft, mildly tender, nondistended, incision clean dry and intact  Disposition: Discharge disposition: 01-Home or Self Care        Allergies as of 07/19/2022   No Known Allergies      Medication List     STOP taking these medications    ascorbic acid 500 MG tablet Commonly known as: VITAMIN C   cholecalciferol 25 MCG (1000 UNIT) tablet Commonly known as: VITAMIN D3   Multivitamin Adult Tabs       TAKE these medications    amLODipine 5 MG tablet Commonly known as: NORVASC TAKE 1 TABLET BY MOUTH IN THE MORNING AND AT BEDTIME   fluticasone 50 MCG/ACT nasal spray Commonly known as: FLONASE Use 2 spray(s) in each nostril once daily What changed: See the new  instructions.   HYDROcodone-acetaminophen 5-325 MG tablet Commonly known as: NORCO/VICODIN Take 1-2 tablets by mouth every 6 (six) hours as needed for moderate pain or severe pain. What changed:  how much to take when to take this reasons to take this   levocetirizine 5 MG tablet Commonly known as: XYZAL TAKE 1 TABLET BY MOUTH ONCE DAILY IN THE EVENING What changed:  when to take this reasons to take this        Follow-up Information     Alexis Frock, MD Follow up on 07/30/2022.   Specialty: Urology Why: at 9:15 for MD visit and pathology review. Contact information: [place] [address] [phone]                 Signed: Marton Redwood, III 07/19/2022, 8:26 AM

## 2022-07-20 ENCOUNTER — Telehealth: Payer: Self-pay | Admitting: *Deleted

## 2022-07-20 NOTE — Transitions of Care (Post Inpatient/ED Visit) (Signed)
   07/20/2022  Name: [name] MRN: [id number] DOB: [birth year]  Today's TOC FU Call Status: Today's TOC FU Call Status:: Successful TOC FU Call Competed TOC FU Call Complete Date: 07/20/22  Transition Care Management Follow-up Telephone Call Date of Discharge: 07/19/22 Discharge Facility: [facility]) Type of Discharge: Inpatient Admission Primary Inpatient Discharge Diagnosis:: periuretherral mass How have you been since you were released from the hospital?: Better  Items Reviewed: Did you receive and understand the discharge instructions provided?: Yes Medications obtained and verified?: Yes (Medications Reviewed) Any new allergies since your discharge?: No Dietary orders reviewed?: No Do you have support at home?: Yes People in Home: spouse Name of Support/Comfort Primary Source: [facility] and Equipment/Supplies: [place] Ordered?: No Any new equipment or medical supplies ordered?: No  Functional Questionnaire: Do you need assistance with bathing/showering or dressing?: No Do you need assistance with meal preparation?: No Do you need assistance with eating?: No Do you have difficulty maintaining continence: No Do you need assistance with getting out of bed/getting out of a chair/moving?: No Do you have difficulty managing or taking your medications?: No  Follow up appointments reviewed: PCP Follow-up appointment confirmed?: Yes Date of PCP follow-up appointment?: 07/29/22 Follow-up Provider: Dr Danise Mina [facility] Follow-up appointment confirmed?: Yes Date of Specialist follow-up appointment?: 07/30/22 Follow-Up Specialty Provider:: Dr Tresa Moore 9:15 Do you need transportation to your follow-up appointment?: No Do you understand care options if your condition(s) worsen?: Yes-patient verbalized understanding  SDOH Interventions Today    Flowsheet Row Most Recent Value  SDOH Interventions   Food Insecurity Interventions  Intervention Not Indicated  Housing Interventions Intervention Not Indicated  Transportation Interventions Intervention Not Indicated      Interventions Today    Flowsheet Row Most Recent Value  General Interventions   General Interventions Discussed/Reviewed General Interventions Discussed, General Interventions Reviewed, Doctor Visits  Doctor Visits Discussed/Reviewed Specialist, Doctor Visits Reviewed, Doctor Visits Discussed  Pharmacy Interventions   Pharmacy Dicussed/Reviewed Pharmacy Topics Reviewed, Pharmacy Topics Discussed        [place] [facility] [phone]

## 2022-07-22 LAB — SURGICAL PATHOLOGY

## 2022-07-23 ENCOUNTER — Encounter: Payer: Self-pay | Admitting: Pharmacist

## 2022-07-29 ENCOUNTER — Ambulatory Visit: Payer: Federal, State, Local not specified - PPO | Admitting: Family Medicine

## 2022-07-30 DIAGNOSIS — N130 Hydronephrosis with ureteropelvic junction obstruction: Secondary | ICD-10-CM | POA: Diagnosis not present

## 2022-08-24 ENCOUNTER — Ambulatory Visit: Payer: Federal, State, Local not specified - PPO | Admitting: Family Medicine

## 2022-08-31 ENCOUNTER — Encounter: Payer: Self-pay | Admitting: Family Medicine

## 2022-08-31 NOTE — Telephone Encounter (Signed)
Printed form and placed in Dr. G's box.  

## 2022-09-02 NOTE — Telephone Encounter (Signed)
Patient called to check on the status of the form being completed.

## 2022-09-04 NOTE — Telephone Encounter (Signed)
Tried to call - unable to reach patient at # in chart "call cannot be completed as dialed".

## 2022-09-04 NOTE — Telephone Encounter (Signed)
Tried calling again, no luck getting through on # in chart

## 2022-09-09 ENCOUNTER — Ambulatory Visit: Payer: Federal, State, Local not specified - PPO | Admitting: Family Medicine

## 2022-09-09 ENCOUNTER — Encounter: Payer: Self-pay | Admitting: Family Medicine

## 2022-09-09 VITALS — BP 135/88 | HR 79 | Temp 97.5°F | Ht 65.0 in | Wt 207.1 lb

## 2022-09-09 DIAGNOSIS — M7989 Other specified soft tissue disorders: Secondary | ICD-10-CM

## 2022-09-09 DIAGNOSIS — I10 Essential (primary) hypertension: Secondary | ICD-10-CM | POA: Diagnosis not present

## 2022-09-09 DIAGNOSIS — E041 Nontoxic single thyroid nodule: Secondary | ICD-10-CM

## 2022-09-09 DIAGNOSIS — N2889 Other specified disorders of kidney and ureter: Secondary | ICD-10-CM

## 2022-09-09 DIAGNOSIS — D700 Congenital agranulocytosis: Secondary | ICD-10-CM

## 2022-09-09 DIAGNOSIS — C44599 Other specified malignant neoplasm of skin of other part of trunk: Secondary | ICD-10-CM

## 2022-09-09 DIAGNOSIS — N1832 Chronic kidney disease, stage 3b: Secondary | ICD-10-CM | POA: Diagnosis not present

## 2022-09-09 DIAGNOSIS — N1330 Unspecified hydronephrosis: Secondary | ICD-10-CM

## 2022-09-09 MED ORDER — AMLODIPINE BESYLATE 5 MG PO TABS: 5.0000 mg | ORAL_TABLET | Freq: Two times a day (BID) | ORAL | 1 refills | Status: DC

## 2022-09-09 NOTE — Assessment & Plan Note (Addendum)
2.2cm enhancing right-sided subcutaneous lesion within the mons pubis on recent MRI. Pt notes this is skin cyst that is being followed by dermatology - she will call and schedule f/u.

## 2022-09-09 NOTE — Assessment & Plan Note (Addendum)
Reviewed reasons to biopsy 2.7cm L thyroid nodule - she declines for now, agrees to rpt thyroid ultrasound to monitor nodule.  TFTs normal.

## 2022-09-09 NOTE — Assessment & Plan Note (Addendum)
S/p partial surgical resection of bilateral peri-ureteral masses, L mass remain.  Appreciate urology care - seeing frequently for surveillance.  L ureteral stent remains in place

## 2022-09-09 NOTE — Patient Instructions (Addendum)
We will repeat thyroid ultrasound to monitor left sided lower thyroid nodule.  BP was elevated today - continue amlodipine 5mg  twice daily, continue ot monitor blood pressures at home, let me know if consistently >140/90, but ideally want <130 on top. We may discuss starting second BP medicine.  Schedule physical after 12/31/2022.

## 2022-09-09 NOTE — Assessment & Plan Note (Signed)
Underwent robotic bilateral ureterolysis with R periureteral mass excision, conversion to exploratory laparotomy with L oophorectomy.  ?fibroid vs endometriosis.  Seeing urology regularly now.

## 2022-09-09 NOTE — Assessment & Plan Note (Signed)
Saw hematology 2023 - thought benign ethnic neutropenia

## 2022-09-09 NOTE — Assessment & Plan Note (Signed)
H/o this.  ?

## 2022-09-09 NOTE — Assessment & Plan Note (Addendum)
Chronic, above goal in office however home readings run 130/80s. Discussed ideally want tighter BP control <130/85. Continue amlodipine 5mg  BID, she will continue monitoring BP at home and let us know if consistently >140/90 for additional antihypertensive.  Reassess at CPE late summer.  Continue good water intake, limiting salt/sodium in diet.

## 2022-09-09 NOTE — Progress Notes (Signed)
Ph: [phone]       Fax: [phone]   Patient ID: [name], female    DOB: 1966/03/04, 57 y.o.   MRN: [id number]  This visit was conducted in person.  BP 135/88 Comment: regularly on home BP cuff  Pulse 79   Temp (!) 97.5 F (36.4 C) (Temporal)   Ht 5\' 5"  (1.651 m)   Wt 207 lb 2 oz (94 kg)   LMP 06/25/2017   SpO2 98%   BMI 34.47 kg/m   BP Readings from Last 3 Encounters:  09/09/22 135/88  07/19/22 137/85  07/09/22 (!) 150/92  Bp elevated on repeat 160/90 Home BPs consistently run 130s/80s.   CC: follow up visit  Subjective:   HPI: [name] is a 57 y.o. female presenting on 09/09/2022 for Medical Management of Chronic Issues (Here for 6 mo f/u.)   HTN - continues amlodipine 5mg  daily. Home readings run well controlled. No HA, vision changes, CP/tightness, SOB, leg swelling.   Requests forms for work filled out - planning to participate in [facility] voluntary wellness program. Doesn't plan to participate in annual fitness test this year.   CKD with L hydronephrosis, MR abdomen/pelvis showed L>R periureteral soft tissue masses - saw urology Dr Berneice Heinrich who completed resection of R mass, but unable to resect left side (07/17/2022). Bilateral ureteral stents placed. ?fibroid and/or endometriosis. Renogram 2024 with R kidney function at 88%, L 12%. New Cr baseline 1.4. planned routine surveillance with imaging through urology. To discuss tamoxifen with GYN.  Pathology: A. LEFT OVARY, RESECTION: Organized hematoma with pseudo-decidualization Consistent with hemorrhagic corpus luteum or endometriosis Benign atrophic ovary B. RIGHT PERIURETERAL MASS AND OVARIAN VEIN, RESECTION: Organizing ovarian vein thrombus with secondary vascular muscular hyperplasia Perivascular soft tissue with hemosiderin deposit Negative for malignancy  She's sees GYN Dr Marcelle Overlie in 02/2023.   Chronic lymphocytosis with mild neutropenia - saw hematology Dr Leonides Schanz who thought this was due to  chronic benign neutropenia,. 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   Thyroid [place] 01/2022 - multinodular thyroid gland with 2.7cm L lower thyroid nodule recommended biopsy - still pending. She declines biopsy at this time but agrees to rpt thyroid US.  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 Recent MRI 03/2022 also showed 2.2cm enhancing right-sided subcutaneous lesion within the mons pubis. She states she has known epidermal cyst to that area present for about a year, followed by her dermatologist and not changing. She will call to schedule follow up appointment with dermatologist Dr Carlynn Purl [facility]).  H/o hysterectomy with RSO for fibroids 2019.      Relevant past medical, surgical, family and social history reviewed and updated as indicated. Interim medical history since our last visit reviewed. Allergies and medications reviewed and updated. Outpatient Medications Prior to Visit  Medication Sig Dispense Refill   fluticasone (FLONASE) 50 MCG/ACT nasal spray Use 2 spray(s) in each nostril once daily (Patient taking differently: Place 1 spray into both nostrils daily as needed for allergies.) 48 g 0   HYDROcodone-acetaminophen (NORCO/VICODIN) 5-325 MG tablet Take 1-2 tablets by mouth every 6 (six) hours as needed for moderate pain or severe pain. 20 tablet 0   levocetirizine (XYZAL) 5 MG tablet TAKE 1 TABLET BY MOUTH ONCE DAILY IN THE EVENING (Patient taking differently: Take 5 mg by mouth at bedtime as needed for allergies.) 90 tablet 0   amLODipine (  NORVASC) 5 MG tablet TAKE 1 TABLET BY MOUTH IN THE MORNING AND AT BEDTIME 180 tablet 2   No facility-administered medications prior to visit.     Per HPI unless specifically indicated in ROS section below Review of Systems  Objective:  BP 135/88 Comment: regularly on home BP cuff  Pulse 79   Temp (!) 97.5 F (36.4 C)  (Temporal)   Ht 5\' 5"  (1.651 m)   Wt 207 lb 2 oz (94 kg)   LMP 06/25/2017   SpO2 98%   BMI 34.47 kg/m   Wt Readings from Last 3 Encounters:  09/09/22 207 lb 2 oz (94 kg)  07/17/22 208 lb 0.8 oz (94.4 kg)  07/09/22 208 lb (94.3 kg)      Physical Exam Vitals and nursing note reviewed.  Constitutional:      Appearance: Normal appearance.  HENT:     Head: Normocephalic and atraumatic.     Mouth/Throat:     Mouth: Mucous membranes are moist.     Pharynx: Oropharynx is clear. No oropharyngeal exudate or posterior oropharyngeal erythema.  Eyes:     Extraocular Movements: Extraocular movements intact.     Conjunctiva/sclera: Conjunctivae normal.     Pupils: Pupils are equal, round, and reactive to light.  Neck:     Thyroid: Thyromegaly present. No thyroid mass or thyroid tenderness.  Cardiovascular:     Rate and Rhythm: Normal rate and regular rhythm.     Pulses: Normal pulses.     Heart sounds: Normal heart sounds. No murmur heard. Pulmonary:     Effort: Pulmonary effort is normal. No respiratory distress.     Breath sounds: Normal breath sounds. No wheezing, rhonchi or rales.  Abdominal:       Comments: Tender firm nodule to R pubic region  Musculoskeletal:     Right lower leg: No edema.     Left lower leg: No edema.  Skin:    General: Skin is warm and dry.     Findings: No rash.  Neurological:     Mental Status: She is alert.  Psychiatric:        Mood and Affect: Mood normal.        Behavior: Behavior normal.       Lab Results  Component Value Date   CREATININE 1.32 (H) 07/19/2022   BUN 19 07/19/2022   NA 139 07/19/2022   K 3.9 07/19/2022   CL 104 07/19/2022   CO2 26 07/19/2022   GFR= 48 Lab Results  Component Value Date   WBC 4.3 07/09/2022   HGB 11.6 (L) 07/19/2022   HCT 36.4 07/19/2022   MCV 89.6 07/09/2022   PLT 246 07/09/2022    Lab Results  Component Value Date   TSH 1.45 01/20/2022    Assessment & Plan:  Will forward note attn Dr Marcelle Overlie  GYN.   Problem List Items Addressed This Visit     Dermatofibrosarcoma protuberans of pubic region    H/o this.       Essential hypertension, benign - Primary    Chronic, above goal in office however home readings run 130/80s. Discussed ideally want tighter BP control <130/85. Continue amlodipine 5mg  BID, she will continue monitoring BP at home and let us know if consistently >140/90 for additional antihypertensive.  Reassess at CPE late summer.  Continue good water intake, limiting salt/sodium in diet.       Relevant Medications   amLODipine (NORVASC) 5 MG tablet   Congenital neutropenia (HCC)  Saw hematology 2023 - thought benign ethnic neutropenia      CKD (chronic kidney disease) stage 3, GFR 30-59 ml/min (HCC)    S/p partial surgical resection of bilateral peri-ureteral masses, L mass remain.  Appreciate urology care - seeing frequently for surveillance.  L ureteral stent remains in place      Hydronephrosis of left kidney   Thyroid nodule    Reviewed reasons to biopsy 2.7cm L thyroid nodule - she declines for now, agrees to rpt thyroid ultrasound to monitor nodule.  TFTs normal.       Relevant Orders   US THYROID   Mass of soft tissue of pelvis    2.2cm enhancing right-sided subcutaneous lesion within the mons pubis on recent MRI. Pt notes this is skin cyst that is being followed by dermatology - she will call and schedule f/u.       Ureteral mass    Underwent robotic bilateral ureterolysis with R periureteral mass excision, conversion to exploratory laparotomy with L oophorectomy.  ?fibroid vs endometriosis.  Seeing urology regularly now.         Meds ordered this encounter  Medications   amLODipine (NORVASC) 5 MG tablet    Sig: Take 1 tablet (5 mg total) by mouth in the morning and at bedtime.    Dispense:  180 tablet    Refill:  1    Orders Placed This Encounter  Procedures   US THYROID    Standing Status:   Future    Standing Expiration Date:    09/09/2023    Order Specific Question:   Reason for Exam (SYMPTOM  OR DIAGNOSIS REQUIRED)    Answer:   follow up L thyroid nodule    Order Specific Question:   Preferred imaging location?    Answer:   [facility]    Patient Instructions  We will repeat thyroid ultrasound to monitor left sided lower thyroid nodule.  BP was elevated today - continue amlodipine 5mg  twice daily, continue ot monitor blood pressures at home, let me know if consistently >140/90, but ideally want <130 on top. We may discuss starting second BP medicine.  Schedule physical after 12/31/2022.   Follow up plan: Return in about 4 months (around 01/01/2023) for annual exam, prior fasting for blood work.  Eustaquio Boyden, MD

## 2022-09-16 ENCOUNTER — Ambulatory Visit: Payer: Federal, State, Local not specified - PPO | Admitting: Podiatry

## 2022-09-16 ENCOUNTER — Encounter: Payer: Self-pay | Admitting: Podiatry

## 2022-09-16 ENCOUNTER — Ambulatory Visit (INDEPENDENT_AMBULATORY_CARE_PROVIDER_SITE_OTHER): Payer: Federal, State, Local not specified - PPO

## 2022-09-16 DIAGNOSIS — M2142 Flat foot [pes planus] (acquired), left foot: Secondary | ICD-10-CM | POA: Diagnosis not present

## 2022-09-16 DIAGNOSIS — R52 Pain, unspecified: Secondary | ICD-10-CM

## 2022-09-16 DIAGNOSIS — M7672 Peroneal tendinitis, left leg: Secondary | ICD-10-CM

## 2022-09-16 DIAGNOSIS — M2141 Flat foot [pes planus] (acquired), right foot: Secondary | ICD-10-CM | POA: Diagnosis not present

## 2022-09-16 NOTE — Progress Notes (Signed)
  Subjective:  Patient ID: [name], female    DOB: Sep 30, 1965,   MRN: [id number]  Chief Complaint  Patient presents with   Foot Pain    Pain located in the left foot, lateral side, Patient interested in orthotics     57 y.o. female presents for concern of left lateral foot pain. Relates this has been going on for a while and just now finally taking care of it. Denies any current treatments. Relates mostly pain when standing for long periods of time. History of plantar fasciitis.  . Denies any other pedal complaints. Denies n/v/f/c.   Past Medical History:  Diagnosis Date   Allergy    Chronic kidney disease    COVID-19 virus infection 06/2019   Dermatofibrosarcoma protuberans 2014   Ectatic abdominal aorta (HCC) 01/2015   rpt [place] 5 yrs   Fatty liver 01/2015   by US   Gallbladder polyp 01/2015   6mm by [place]   Hypertension    Iron deficiency    Seasonal allergies    Uterine fibroid 01/2015   markedly enlarged    Objective:  Physical Exam: Vascular: DP/PT pulses 2/4 bilateral. CFT <3 seconds. Normal hair growth on digits. No edema.  Skin. No lacerations or abrasions bilateral feet.  Musculoskeletal: MMT 5/5 bilateral lower extremities in DF, PF, Inversion and Eversion. Deceased ROM in DF of ankle joint. Tender to insertion of peroneal tendon and mild pain with eversion of the foot. No pain proximally along tendon. Mild collapse of medial arch. Pes planus bilateral noted. No pain to medial arch or achilles.  Neurological: Sensation intact to light touch.   Assessment:   1. Peroneal tendonitis of left lower extremity   2. Pes planus of both feet   3. Pain [R52]      Plan:  Patient was evaluated and treated and all questions answered. X-rays reviewed and discussed with patient. No acute fractures or dislocations noted. Some collapse of the medial arch consistent with mild pes planus.  Discussed peroneal tendinitis and treatment options at length with patient Discussed  stretching exercises and provided handout. Dispensed Tri-Lock ankle brace. Discussed that if the symptoms do not improve can consider PT/MRI. -Discussed treatement options; discussed pes planus deformity;conservative and  surgical  -Rx orthotics fitted today.  -Recommend good supportive shoes -Recommend daily stretching and icing -Patient to return to office as needed or sooner if condition worsens.    Louann Sjogren, DPM

## 2022-09-16 NOTE — Patient Instructions (Signed)
Peroneal Tendinopathy Rehab Ask your health care provider which exercises are safe for you. Do exercises exactly as told by your health care provider and adjust them as directed. It is normal to feel mild stretching, pulling, tightness, or discomfort as you do these exercises. Stop right away if you feel sudden pain or your pain gets worse. Do not begin these exercises until told by your health care provider. Stretching and range-of-motion exercises These exercises warm up your muscles and joints. They can help improve the movement and flexibility of your ankle. They may also help to relieve pain and stiffness. Gastrocnemius and soleus stretch, standing This is an exercise in which you stand on a step and use your body weight to stretch your calf muscles. To do this exercise: Stand on the edge of a step on the ball of your left / right foot. The ball of your foot is on the walking surface, right under your toes. Keep your other foot firmly on the same step. Hold on to the wall, a railing, or a chair for balance. Slowly lift your other foot, allowing your body weight to press your left / right heel down over the edge of the step. You should feel a stretch in your left / right calf (gastrocnemius and soleus). Hold this position for __________ seconds. Return both feet to the step. Repeat this exercise with a slight bend in your left / right knee. Repeat __________ times with your left / right knee straight and __________ times with your left / right knee bent. Complete this exercise __________ times a day. Strengthening exercises These exercises build strength and endurance in your foot and ankle. Endurance is the ability to use your muscles for a long time, even after they get tired. Ankle dorsiflexion with band  Secure a rubber exercise band or tube to an object, such as a table leg, that will not move when the band is pulled. Secure the other end of the band around your left / right foot. Sit on  the floor. Face the object with your left / right leg extended. The band or tube should be slightly tense when your foot is relaxed. Slowly flex your left / right ankle and toes to bring your foot toward you (dorsiflexion). Hold this position for __________ seconds. Let the band or tube slowly pull your foot back to the starting position. Repeat __________ times. Complete this exercise __________ times a day. Ankle eversion  Sit on the floor with your legs straight out in front of you. Loop a rubber exercise band or tube around the ball of your left / right foot. The ball of your foot is on the walking surface, right under your toes. Hold the ends of the band in your hands. You can also secure the band to a stable object. The band or tube should be slightly tense when your foot is relaxed. Slowly push your foot outward, away from your other leg (eversion). Hold this position for __________ seconds. Slowly return your foot to the starting position. Repeat __________ times. Complete this exercise __________ times a day. Plantar flexion, standing This exercise is sometimes called a standing heel raise. Stand with your feet shoulder-width apart. Place your hands on a wall or table to steady yourself as needed. Try not to use it for support. Keep your weight spread evenly over the width of your feet while you slowly rise up on your toes (plantar flexion). If told by your health care provider: Shift your weight   toward your left / right leg until you feel challenged. Stand on your left / right leg only. Hold this position for __________ seconds. Repeat __________ times. Complete this exercise __________ times a day. Single leg stand  Without shoes, stand near a railing or in a doorway. You may hold on to the railing or doorframe as needed. Stand on your left / right foot. Keep your big toe down on the floor and try to keep your arch lifted. Do not roll to the outside of your foot. If this  exercise is too easy, you can try it with your eyes closed or while standing on a pillow. Hold this position for __________ seconds. Repeat __________ times. Complete this exercise __________ times a day. This information is not intended to replace advice given to you by your health care provider. Make sure you discuss any questions you have with your health care provider. Document Revised: 08/14/2021 Document Reviewed: 08/14/2021 Elsevier Patient Education  2023 [employer]  

## 2022-10-27 DIAGNOSIS — C669 Malignant neoplasm of unspecified ureter: Secondary | ICD-10-CM | POA: Diagnosis not present

## 2022-10-28 DIAGNOSIS — K769 Liver disease, unspecified: Secondary | ICD-10-CM | POA: Diagnosis not present

## 2022-10-28 DIAGNOSIS — R1900 Intra-abdominal and pelvic swelling, mass and lump, unspecified site: Secondary | ICD-10-CM | POA: Diagnosis not present

## 2022-10-28 DIAGNOSIS — K8020 Calculus of gallbladder without cholecystitis without obstruction: Secondary | ICD-10-CM | POA: Diagnosis not present

## 2022-10-28 DIAGNOSIS — N1330 Unspecified hydronephrosis: Secondary | ICD-10-CM | POA: Diagnosis not present

## 2022-10-28 DIAGNOSIS — D3021 Benign neoplasm of right ureter: Secondary | ICD-10-CM | POA: Diagnosis not present

## 2022-11-03 DIAGNOSIS — N130 Hydronephrosis with ureteropelvic junction obstruction: Secondary | ICD-10-CM | POA: Diagnosis not present

## 2022-11-03 DIAGNOSIS — C669 Malignant neoplasm of unspecified ureter: Secondary | ICD-10-CM | POA: Diagnosis not present

## 2022-11-03 DIAGNOSIS — N1831 Chronic kidney disease, stage 3a: Secondary | ICD-10-CM | POA: Diagnosis not present

## 2022-11-12 ENCOUNTER — Telehealth: Payer: Self-pay | Admitting: Podiatry

## 2022-11-12 NOTE — Telephone Encounter (Signed)
Orthotics in... lvm for pt to call to schedule an appt to pick up orthotics.

## 2022-11-16 ENCOUNTER — Other Ambulatory Visit: Payer: Self-pay | Admitting: Urology

## 2022-11-17 ENCOUNTER — Other Ambulatory Visit: Payer: Federal, State, Local not specified - PPO

## 2022-12-10 ENCOUNTER — Encounter (HOSPITAL_BASED_OUTPATIENT_CLINIC_OR_DEPARTMENT_OTHER): Payer: Self-pay | Admitting: Urology

## 2022-12-11 ENCOUNTER — Encounter (HOSPITAL_BASED_OUTPATIENT_CLINIC_OR_DEPARTMENT_OTHER): Payer: Self-pay | Admitting: Urology

## 2022-12-14 ENCOUNTER — Encounter (HOSPITAL_BASED_OUTPATIENT_CLINIC_OR_DEPARTMENT_OTHER): Payer: Self-pay | Admitting: Urology

## 2022-12-14 NOTE — Progress Notes (Signed)
Spoke w/ via phone for pre-op interview--- pt Lab needs dos----  [facility] results------ current EKG in epic/ chart COVID test -----patient states asymptomatic no test needed Arrive at ------- 0615 on 12-18-2022 NPO after MN NO Solid Food.  Clear liquids from MN until--- 0515 Med rec completed Medications to take morning of surgery ----- norvasc Diabetic medication ----- n/a Patient instructed no nail polish to be worn day of surgery Patient instructed to bring photo id and insurance card day of surgery Patient aware to have Driver (ride ) / caregiver    for 24 hours after surgery -- husband, ronnie Patient Special Instructions ----- n/a Pre-Op special Instructions ----- n/a Patient verbalized understanding of instructions that were given at this phone interview. Patient denies shortness of breath, chest pain, fever, cough at this phone interview.

## 2022-12-17 NOTE — Anesthesia Preprocedure Evaluation (Addendum)
Anesthesia Evaluation  Patient identified by MRN, date of birth, ID band Patient awake    Reviewed: Allergy & Precautions, NPO status , Patient's Chart, lab work & pertinent test results  History of Anesthesia Complications Negative for: history of anesthetic complications  Airway Mallampati: I  TM Distance: >3 FB Neck ROM: Full   Comment: Previous grade I view with MAC 3, easy mask Dental  (+) Dental Advisory Given   Pulmonary neg pulmonary ROS   Pulmonary exam normal breath sounds clear to auscultation       Cardiovascular hypertension (amlodipine), Pt. on medications (-) angina (-) Past MI, (-) Cardiac Stents and (-) CABG (-) dysrhythmias  Rhythm:Regular Rate:Normal  Ectatic abdominal aorta   Neuro/Psych negative neurological ROS     GI/Hepatic negative GI ROS,,,Hepatic steatosis   Endo/Other  neg diabetes  Left thyroid nodule  Renal/GU CRFRenal disease     Musculoskeletal   Abdominal  (+) + obese  Peds  Hematology  (+) Blood dyscrasia (congenital neutropenia)   Anesthesia Other Findings   Reproductive/Obstetrics                             Anesthesia Physical Anesthesia Plan  ASA: 3  Anesthesia Plan: General   Post-op Pain Management: Tylenol PO (pre-op)*   Induction: Intravenous  PONV Risk Score and Plan: 3 and Ondansetron, Dexamethasone and Treatment may vary due to age or medical condition  Airway Management Planned: LMA  Additional Equipment:   Intra-op Plan:   Post-operative Plan: Extubation in OR  Informed Consent: I have reviewed the patients History and Physical, chart, labs and discussed the procedure including the risks, benefits and alternatives for the proposed anesthesia with the patient or authorized representative who has indicated his/her understanding and acceptance.     Dental advisory given  Plan Discussed with: CRNA and  Anesthesiologist  Anesthesia Plan Comments: (Risks of general anesthesia discussed including, but not limited to, sore throat, hoarse voice, chipped/damaged teeth, injury to vocal cords, nausea and vomiting, allergic reactions, lung infection, heart attack, stroke, and death. All questions answered. )       Anesthesia Quick Evaluation

## 2022-12-18 ENCOUNTER — Encounter (HOSPITAL_BASED_OUTPATIENT_CLINIC_OR_DEPARTMENT_OTHER)
Admission: RE | Disposition: A | Discharge status: Home / Self Care | Payer: Self-pay | Source: Home / Self Care | Attending: Urology

## 2022-12-18 ENCOUNTER — Encounter (HOSPITAL_BASED_OUTPATIENT_CLINIC_OR_DEPARTMENT_OTHER): Payer: Self-pay | Admitting: Urology

## 2022-12-18 ENCOUNTER — Ambulatory Visit (HOSPITAL_BASED_OUTPATIENT_CLINIC_OR_DEPARTMENT_OTHER): Payer: Federal, State, Local not specified - PPO | Admitting: Anesthesiology

## 2022-12-18 ENCOUNTER — Other Ambulatory Visit: Payer: Self-pay

## 2022-12-18 ENCOUNTER — Ambulatory Visit (HOSPITAL_BASED_OUTPATIENT_CLINIC_OR_DEPARTMENT_OTHER)
Admission: RE | Admit: 2022-12-18 | Discharge: 2022-12-18 | Disposition: A | Discharge status: Home / Self Care | Payer: Federal, State, Local not specified - PPO | Attending: Urology | Admitting: Urology

## 2022-12-18 DIAGNOSIS — N1330 Unspecified hydronephrosis: Secondary | ICD-10-CM | POA: Diagnosis not present

## 2022-12-18 DIAGNOSIS — E669 Obesity, unspecified: Secondary | ICD-10-CM | POA: Insufficient documentation

## 2022-12-18 DIAGNOSIS — E785 Hyperlipidemia, unspecified: Secondary | ICD-10-CM | POA: Diagnosis not present

## 2022-12-18 DIAGNOSIS — Z8554 Personal history of malignant neoplasm of ureter: Secondary | ICD-10-CM | POA: Diagnosis not present

## 2022-12-18 DIAGNOSIS — I129 Hypertensive chronic kidney disease with stage 1 through stage 4 chronic kidney disease, or unspecified chronic kidney disease: Secondary | ICD-10-CM | POA: Insufficient documentation

## 2022-12-18 DIAGNOSIS — N289 Disorder of kidney and ureter, unspecified: Secondary | ICD-10-CM | POA: Insufficient documentation

## 2022-12-18 DIAGNOSIS — Z6833 Body mass index (BMI) 33.0-33.9, adult: Secondary | ICD-10-CM | POA: Insufficient documentation

## 2022-12-18 DIAGNOSIS — N183 Chronic kidney disease, stage 3 unspecified: Secondary | ICD-10-CM | POA: Insufficient documentation

## 2022-12-18 DIAGNOSIS — Z01818 Encounter for other preprocedural examination: Secondary | ICD-10-CM

## 2022-12-18 DIAGNOSIS — K760 Fatty (change of) liver, not elsewhere classified: Secondary | ICD-10-CM | POA: Insufficient documentation

## 2022-12-18 HISTORY — DX: Nontoxic single thyroid nodule: E04.1

## 2022-12-18 HISTORY — DX: Unspecified hydronephrosis: N13.30

## 2022-12-18 HISTORY — DX: Chronic kidney disease, stage 3 unspecified: N18.30

## 2022-12-18 HISTORY — DX: Presence of spectacles and contact lenses: Z97.3

## 2022-12-18 HISTORY — PX: CYSTOSCOPY W/ URETERAL STENT PLACEMENT: SHX1429

## 2022-12-18 HISTORY — DX: Personal history of other benign neoplasm: Z86.018

## 2022-12-18 HISTORY — DX: Congenital agranulocytosis: D70.0

## 2022-12-18 LAB — POCT I-STAT, CHEM 8
BUN: 22 mg/dL — ABNORMAL HIGH (ref 6–20)
Calcium, Ion: 1.23 mmol/L (ref 1.15–1.40)
Chloride: 104 mmol/L (ref 98–111)
Creatinine, Ser: 1.3 mg/dL — ABNORMAL HIGH (ref 0.44–1.00)
Glucose, Bld: 100 mg/dL — ABNORMAL HIGH (ref 70–99)
HCT: 42 % (ref 36.0–46.0)
Hemoglobin: 14.3 g/dL (ref 12.0–15.0)
Potassium: 4 mmol/L (ref 3.5–5.1)
Sodium: 142 mmol/L (ref 135–145)
TCO2: 27 mmol/L (ref 22–32)

## 2022-12-18 SURGERY — CYSTOSCOPY, WITH RETROGRADE PYELOGRAM AND URETERAL STENT INSERTION
Anesthesia: General | Site: Ureter | Laterality: Bilateral

## 2022-12-18 MED ORDER — ACETAMINOPHEN 500 MG PO TABS
1000.0000 mg | ORAL_TABLET | Freq: Once | ORAL | Status: AC
  Administered 2022-12-18: 1000 mg via ORAL

## 2022-12-18 MED ORDER — MIDAZOLAM HCL 5 MG/5ML IJ SOLN
INTRAMUSCULAR | Status: DC | PRN
  Administered 2022-12-18: 2 mg via INTRAVENOUS

## 2022-12-18 MED ORDER — TRAMADOL HCL 50 MG PO TABS
50.0000 mg | ORAL_TABLET | Freq: Four times a day (QID) | ORAL | 0 refills | Status: DC | PRN
Start: 2022-12-18 — End: 2024-02-15

## 2022-12-18 MED ORDER — ONDANSETRON HCL 4 MG/2ML IJ SOLN
INTRAMUSCULAR | Status: DC | PRN
Start: 2022-12-18 — End: 2022-12-18
  Administered 2022-12-18: 4 mg via INTRAVENOUS

## 2022-12-18 MED ORDER — PROPOFOL 10 MG/ML IV BOLUS
INTRAVENOUS | Status: AC
  Filled 2022-12-18: qty 20

## 2022-12-18 MED ORDER — FENTANYL CITRATE (PF) 100 MCG/2ML IJ SOLN
INTRAMUSCULAR | Status: DC | PRN
  Administered 2022-12-18 (×4): 25 ug via INTRAVENOUS

## 2022-12-18 MED ORDER — DEXAMETHASONE SODIUM PHOSPHATE 10 MG/ML IJ SOLN
INTRAMUSCULAR | Status: AC
  Filled 2022-12-18: qty 1

## 2022-12-18 MED ORDER — OXYCODONE HCL 5 MG PO TABS: 5.0000 mg | ORAL_TABLET | Freq: Once | ORAL | Status: DC | PRN

## 2022-12-18 MED ORDER — FENTANYL CITRATE (PF) 100 MCG/2ML IJ SOLN: 25.0000 ug | INTRAMUSCULAR | Status: DC | PRN

## 2022-12-18 MED ORDER — PROPOFOL 10 MG/ML IV BOLUS
INTRAVENOUS | Status: DC | PRN
  Administered 2022-12-18: 200 mg via INTRAVENOUS

## 2022-12-18 MED ORDER — ACETAMINOPHEN 500 MG PO TABS
ORAL_TABLET | ORAL | Status: AC
  Filled 2022-12-18: qty 2

## 2022-12-18 MED ORDER — LIDOCAINE 2% (20 MG/ML) 5 ML SYRINGE
INTRAMUSCULAR | Status: DC | PRN
  Administered 2022-12-18: 100 mg via INTRAVENOUS

## 2022-12-18 MED ORDER — IOHEXOL 300 MG/ML  SOLN
INTRAMUSCULAR | Status: DC | PRN
  Administered 2022-12-18: 30 mL via URETHRAL

## 2022-12-18 MED ORDER — ONDANSETRON HCL 4 MG/2ML IJ SOLN
INTRAMUSCULAR | Status: AC
  Filled 2022-12-18: qty 2

## 2022-12-18 MED ORDER — OXYCODONE HCL 5 MG/5ML PO SOLN: 5.0000 mg | Freq: Once | ORAL | Status: DC | PRN

## 2022-12-18 MED ORDER — AMISULPRIDE (ANTIEMETIC) 5 MG/2ML IV SOLN: 10.0000 mg | Freq: Once | INTRAVENOUS | Status: DC | PRN

## 2022-12-18 MED ORDER — DEXAMETHASONE SODIUM PHOSPHATE 4 MG/ML IJ SOLN
INTRAMUSCULAR | Status: DC | PRN
  Administered 2022-12-18: 5 mg via INTRAVENOUS

## 2022-12-18 MED ORDER — CEFAZOLIN SODIUM-DEXTROSE 2-4 GM/100ML-% IV SOLN
2.0000 g | INTRAVENOUS | Status: AC
  Administered 2022-12-18: 2 g via INTRAVENOUS

## 2022-12-18 MED ORDER — CEFAZOLIN SODIUM-DEXTROSE 2-4 GM/100ML-% IV SOLN
INTRAVENOUS | Status: AC
  Filled 2022-12-18: qty 100

## 2022-12-18 MED ORDER — SODIUM CHLORIDE 0.9 % IR SOLN
Status: DC | PRN
  Administered 2022-12-18: 3000 mL

## 2022-12-18 MED ORDER — FENTANYL CITRATE (PF) 100 MCG/2ML IJ SOLN
INTRAMUSCULAR | Status: AC
  Filled 2022-12-18: qty 2

## 2022-12-18 MED ORDER — MIDAZOLAM HCL 2 MG/2ML IJ SOLN
INTRAMUSCULAR | Status: AC
  Filled 2022-12-18: qty 2

## 2022-12-18 MED ORDER — SODIUM CHLORIDE 0.9 % IV SOLN: INTRAVENOUS | Status: DC

## 2022-12-18 MED ORDER — LIDOCAINE HCL (PF) 2 % IJ SOLN
INTRAMUSCULAR | Status: AC
  Filled 2022-12-18: qty 5

## 2022-12-18 SURGICAL SUPPLY — 26 items
BAG DRAIN URO-CYSTO SKYTR STRL (DRAIN) ×1 IMPLANT
BAG DRN UROCATH (DRAIN) ×1
BASKET LASER NITINOL 1.9FR (BASKET) IMPLANT
BSKT STON RTRVL 120 1.9FR (BASKET)
CATH URETL OPEN END 6FR 70 (CATHETERS) IMPLANT
CLOTH BEACON ORANGE TIMEOUT ST (SAFETY) ×1 IMPLANT
GLOVE BIO SURGEON STRL SZ7.5 (GLOVE) ×1 IMPLANT
GOWN STRL REUS W/TWL LRG LVL3 (GOWN DISPOSABLE) ×1 IMPLANT
GUIDEWIRE ANG ZIPWIRE 038X150 (WIRE) ×1 IMPLANT
GUIDEWIRE STR DUAL SENSOR (WIRE) ×1 IMPLANT
IV NS 1000ML (IV SOLUTION)
IV NS 1000ML BAXH (IV SOLUTION) ×1 IMPLANT
IV NS IRRIG 3000ML ARTHROMATIC (IV SOLUTION) ×1 IMPLANT
KIT TURNOVER CYSTO (KITS) ×1 IMPLANT
LASER FIB FLEXIVA PULSE ID 365 (Laser) IMPLANT
MANIFOLD NEPTUNE II (INSTRUMENTS) ×1 IMPLANT
NS IRRIG 500ML POUR BTL (IV SOLUTION) ×1 IMPLANT
PACK CYSTO (CUSTOM PROCEDURE TRAY) ×1 IMPLANT
SLEEVE SCD COMPRESS KNEE MED (STOCKING) ×1 IMPLANT
STENT URET 6FRX24 CONTOUR (STENTS) IMPLANT
SYR 10ML LL (SYRINGE) ×1 IMPLANT
TRACTIP FLEXIVA PULS ID 200XHI (Laser) IMPLANT
TRACTIP FLEXIVA PULSE ID 200 (Laser)
TUBE CONNECTING 12X1/4 (SUCTIONS) ×1 IMPLANT
TUBE PU 8FR 16IN ENFIT (TUBING) IMPLANT
TUBING UROLOGY SET (TUBING) ×1 IMPLANT

## 2022-12-18 NOTE — Brief Op Note (Signed)
12/18/2022  8:08 AM  PATIENT:  Fayette Pho  57 y.o. female  PRE-OPERATIVE DIAGNOSIS:  BILATERAL URETERAL MASSES  POST-OPERATIVE DIAGNOSIS:  BILATERAL URETERAL MASSES  PROCEDURE:  Procedure(s): CYSTOSCOPY WITH RETROGRADE PYELOGRAM/URETERAL STENT REMOVAL VERSUS EXCHANGE (Bilateral)  SURGEON:  Surgeons and Role:    * Prescious Hurless, Delbert Phenix., MD - Primary  PHYSICIAN ASSISTANT:   ASSISTANTS: none   ANESTHESIA:   general  EBL:  minimal   BLOOD ADMINISTERED:none  DRAINS: none   LOCAL MEDICATIONS USED:  NONE  SPECIMEN:  Source of Specimen:  bilateral stents for discard  DISPOSITION OF SPECIMEN:   discard  COUNTS:  YES  TOURNIQUET:  * No tourniquets in log *  DICTATION: .Other Dictation: Dictation Number [id number]  PLAN OF CARE: Discharge to home after PACU  PATIENT DISPOSITION:  PACU - hemodynamically stable.   Delay start of Pharmacological VTE agent (>24hrs) due to surgical blood loss or risk of bleeding: yes

## 2022-12-18 NOTE — Transfer of Care (Signed)
Immediate Anesthesia Transfer of Care Note  Patient: [name]  Procedure(s) Performed: Procedure(s) (LRB): CYSTOSCOPY WITH BILATERAL RETROGRADE PYELOGRAM/RIGHT URETERAL STENT REMOVAL AND LEFT URETERAL STENT EXCHANGE (Bilateral)  Patient Location: PACU  Anesthesia Type: General  Level of Consciousness: awake, sedated, patient cooperative and responds to stimulation  Airway & Oxygen Therapy: Patient Spontanous Breathing and Patient connected to [place] oxygen  Post-op Assessment: Report given to PACU RN, Post -op Vital signs reviewed and stable and Patient moving all extremities  Post vital signs: Reviewed and stable  Complications: No apparent anesthesia complications

## 2022-12-18 NOTE — Discharge Instructions (Addendum)
1 - You may have urinary urgency (bladder spasms) and bloody urine on / off with stent in place. This is normal.  2 - Call MD or go to ER for fever >102, severe pain / nausea / vomiting not relieved by medications, or acute change in medical status   Post Anesthesia Home Care Instructions  Activity: Get plenty of rest for the remainder of the day. A responsible individual must stay with you for 24 hours following the procedure.  For the next 24 hours, DO NOT: -Drive a car -Advertising copywriter -Drink alcoholic beverages -Take any medication unless instructed by your physician -Make any legal decisions or sign important papers.  Meals: Start with liquid foods such as gelatin or soup. Progress to regular foods as tolerated. Avoid greasy, spicy, heavy foods. If nausea and/or vomiting occur, drink only clear liquids until the nausea and/or vomiting subsides. Call your physician if vomiting continues.  Special Instructions/Symptoms: Your throat may feel dry or sore from the anesthesia or the breathing tube placed in your throat during surgery. If this causes discomfort, gargle with warm salt water. The discomfort should disappear within 24 hours.     No acetaminophen/Tylenol until after 1241 pm today if needed.

## 2022-12-18 NOTE — Anesthesia Postprocedure Evaluation (Signed)
Anesthesia Post Note  Patient: GENESHA COLANGELO  Procedure(s) Performed: CYSTOSCOPY WITH BILATERAL RETROGRADE PYELOGRAM/RIGHT URETERAL STENT REMOVAL AND LEFT URETERAL STENT EXCHANGE (Bilateral: Ureter)     Patient location during evaluation: PACU Anesthesia Type: General Level of consciousness: awake Pain management: pain level controlled Vital Signs Assessment: post-procedure vital signs reviewed and stable Respiratory status: spontaneous breathing, nonlabored ventilation and respiratory function stable Cardiovascular status: blood pressure returned to baseline and stable Postop Assessment: no apparent nausea or vomiting Anesthetic complications: no   No notable events documented.  Last Vitals:  Vitals:   12/18/22 0830 12/18/22 0845  BP: (!) 137/90 (!) 149/92  Pulse: 76 69  Resp: 15 14  Temp:  36.6 C  SpO2: 100% 97%    Last Pain:  Vitals:   12/18/22 0845  TempSrc:   PainSc: 0-No pain                 Linton Rump

## 2022-12-18 NOTE — Anesthesia Procedure Notes (Signed)
Procedure Name: LMA Insertion Date/Time: 12/18/2022 7:54 AM  Performed by: Jessica Priest, CRNAPre-anesthesia Checklist: Patient identified, Emergency Drugs available, Suction available, Patient being monitored and Timeout performed Patient Re-evaluated:Patient Re-evaluated prior to induction Oxygen Delivery Method: Circle system utilized Preoxygenation: Pre-oxygenation with 100% oxygen Induction Type: IV induction Ventilation: Mask ventilation without difficulty LMA: LMA inserted LMA Size: 4.0 Number of attempts: 1 Airway Equipment and Method: Bite block Placement Confirmation: positive ETCO2, breath sounds checked- equal and bilateral and CO2 detector Tube secured with: Tape Dental Injury: Teeth and Oropharynx as per pre-operative assessment

## 2022-12-18 NOTE — H&P (Signed)
[name] is an 57 y.o. female.    Chief Complaint: Pre-OP Cysto, BILATERAL retrotrades / ureteral stent exchange v. removal  HPI:   1 - BIlateral Peri-Ureteral Masses with Obstruction - s/p robotic/open resection of RIGHT peri-ureteral mass and attempt left mass (unresectable due to encasement of iliacs and IMA) for Very rare L>R peri-uretreral neoplasms on eval left hydro late 2023. Pre-op BX favors [name]. Some left hydro with parenchymal loss. 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 Now s/p removal both ovaries. Renogram 2024 with Rt 88%, Lt 12% relative function. Cr 1.4 ag baseline.   Post-OP Surveillance:  11/2022 - CMP, CT - No recurrence Rt side, Stable Lt side, ? mons area mass   2 - STage 3 REnal Insuficiency - Cr 1.4's, GFR 50 at baseline. Seom L>R obstruction as per above.   PMH sig for obesity, open infraumbilical midline hyst for large fibroids, HTN. No ischemic CV disesae / blood thinners. 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 Her PCP is Victoria Boyden MD.   Today " [name] " is seen to proceed with cysto, retrogrades, bilateral ureteral stent removal v. Exchange. No interval fevers. Most recent UA without infectious parameters.   Past Medical History:  Diagnosis Date   CKD (chronic kidney disease), stage III (HCC)    Congenital neutropenia (HCC)    followed by pcp;  evaluated by hematology--- dr Leonides Schanz (note in epic 01-22-2022 benign , ethnic   Ectatic abdominal aorta (HCC) 01/2015   rpt [place] 5 yrs   Gallbladder polyp 01/2015   6mm by US   Hepatic steatosis 01/2015   by [place]   History of excision of mass 06/2012   followedd by general surgeon--- dr gerkin;  02/ 2024  s/p  excision soft tissue mass from pubis and lower abdominal wall area--- dermatofibrosarcoma protuberrans   History of uterine fibroid    Hydronephrosis,  left    urologist--- dr Berneice Heinrich;   treated w/ ureteral stent   Hypertension    Left thyroid nodule    followed by pcp;   last ultrasound in epic 01-15-2022,  per pcp note pt refuses bx   Seasonal allergies    Ureteral mass 07/2022   bilateral;   followed by urologist-- dr Berneice Heinrich;   s/p  resection partial resection periureteral mass right side, attempted left side,  left ureteral mass remains   Wears glasses     Past Surgical History:  Procedure Laterality Date   COLONOSCOPY WITH PROPOFOL  10/13/2019   dr beavers   CYSTOSCOPY W/ URETERAL STENT PLACEMENT Bilateral 07/17/2022   Procedure: CYSTOSCOPY WITH RETROGRADE PYELOGRAM/URETERAL STENT PLACEMENT;  Surgeon: Sebastian Ache, MD;  Location: [facility];  Service: Urology;  Laterality: Bilateral;   CYSTOSCOPY WITH RETROGRADE PYELOGRAM, URETEROSCOPY AND STENT PLACEMENT Bilateral 04/20/2022   Procedure: CYSTOSCOPY WITH BILATERAL RETROGRADE PYELOGRAM, LEFT URETEROSCOPY AND STENT PLACEMENT;  Surgeon: Crista Elliot, MD;  Location: [facility];  Service: Urology;  Laterality: Bilateral;  1 HR FOR CASE   HYSTERECTOMY ABDOMINAL WITH SALPINGECTOMY Bilateral 07/22/2017   Procedure: HYSTERECTOMY ABDOMINAL WITH SALPINGECTOMY;  Surgeon: Richarda Overlie, MD;  Location: [facility];  Service: Gynecology;  Laterality: Bilateral;   HYSTEROSCOPY WITH THERMACHOICE  01/28/2007   @[facility]  by dr Marcelle Overlie;  D&C Hysteroscopy w/ polypectomy/ Thermachoice endometrial ablation   OOPHORECTOMY Right 07/22/2017   Procedure: OOPHORECTOMY;  Surgeon: Marcelle Overlie,  Richard, MD;  Location: [facility];  Service: Gynecology;  Laterality: Right;   SOFT TISSUE MASS EXCISION  06/10/2012   @[facility]  by dr gerkin;  pubis and  lower abdomen wall area  (dermatofibrosarcoma)   TUBAL LIGATION Bilateral 1997   XI ROBOTIC ASSISTED OOPHORECTOMY Left 07/17/2022   Procedure: XI ROBOTIC ASSISTED LEFT OOPHORECTOMY;  Surgeon: Sebastian Ache, MD;  Location: [facility];  Service: Urology;  Laterality: Left;     Family History  Problem Relation Age of Onset   Diabetes Mother    Hypertension Mother    Diabetes Father    Hypertension Father    Cancer Paternal Aunt 40       breast   Stroke Neg Hx    Coronary artery disease Neg Hx    Colon cancer Neg Hx    Esophageal cancer Neg Hx    Rectal cancer Neg Hx    Stomach cancer Neg Hx    Social History:  reports that she has never smoked. She has never used smokeless tobacco. She reports that she does not drink alcohol and does not use drugs.  Allergies: No Known Allergies  Medications Prior to Admission  Medication Sig Dispense Refill   amLODipine (NORVASC) 5 MG tablet Take 1 tablet (5 mg total) by mouth in the morning and at bedtime. (Patient taking differently: Take 5 mg by mouth in the morning and at bedtime.) 180 tablet 1   Cholecalciferol (VITAMIN D-3) 25 MCG (1000 UT) CAPS Take 1 capsule by mouth daily.     fluticasone (FLONASE) 50 MCG/ACT nasal spray Use 2 spray(s) in each nostril once daily (Patient taking differently: Place 1 spray into both nostrils daily as needed for allergies.) 48 g 0   levocetirizine (XYZAL) 5 MG tablet TAKE 1 TABLET BY MOUTH ONCE DAILY IN THE EVENING (Patient taking differently: Take 5 mg by mouth at bedtime as needed for allergies.) 90 tablet 0   Multiple Vitamin (MULTIVITAMIN) capsule Take 1 capsule by mouth daily.      Results for orders placed or performed during the hospital encounter of 12/18/22 (from the past 48 hour(s))  I-STAT, chem 8     Status: Abnormal   Collection Time: 12/18/22  6:49 AM  Result Value Ref Range   Sodium 142 135 - 145 mmol/L   Potassium 4.0 3.5 - 5.1 mmol/L   Chloride 104 98 - 111 mmol/L   BUN 22 (H) 6 - 20 mg/dL   Creatinine, Ser 1.61 (H) 0.44 - 1.00 mg/dL   Glucose, Bld 096 (H) 70 - 99 mg/dL    Comment: Glucose reference range applies only to samples taken after fasting for at least 8 hours.   Calcium, Ion 1.23 1.15 - 1.40 mmol/L   TCO2 27 22 - 32 mmol/L   Hemoglobin 14.3  12.0 - 15.0 g/dL   HCT 04.5 40.9 - 81.1 %   DG Foot Complete Left  Result Date: 12/17/2022 Please see detailed radiograph report in office note.   Review of Systems  Constitutional:  Negative for chills and fever.  Genitourinary:  Positive for urgency.  All other systems reviewed and are negative.   Height 5\' 6"  (1.676 m), weight 89.8 kg, last menstrual period 06/25/2017. Physical Exam   Assessment/Plan  Proceed as planned with cysto, retrogrades, bilateral ureteral stent excahnge v. Removal. Goals are renal functional preservation. Risks, benefits, alternatives, expected peri-op course discussed previously and reiterated today.   Loletta Parish., MD 12/18/2022, 6:51 AM

## 2022-12-21 ENCOUNTER — Encounter (HOSPITAL_BASED_OUTPATIENT_CLINIC_OR_DEPARTMENT_OTHER): Payer: Self-pay | Admitting: Urology

## 2022-12-21 NOTE — Op Note (Unsigned)
[name], [facility] MEDICAL RECORD NO: [id number] ACCOUNT NO: [id number] DATE OF BIRTH: Jun 29, 1965 FACILITY: [facility] LOCATION: [facility] PHYSICIAN: Sebastian Ache, MD  Operative Report   DATE OF PROCEDURE: 12/18/2022  PREOPERATIVE DIAGNOSIS:  History of bilateral periureteral masses, suspect severe endometriosis.  PROCEDURE PERFORMED:  1.  Cystoscopy with bilateral retrograde pyelograms interpretation. 2.  Right ureteral stent removal. 3.  Left ureteral stent exchange.  ESTIMATED BLOOD LOSS:  Nil.  COMPLICATIONS:  None.  SPECIMEN:  Bilateral ureteral stents for discard, inspected and intact.  FINDINGS:  1.  Resolution of prior right sided hydronephrosis and extrinsic compression. 2.  Persistence but suggestion of improvement in the left sided extrinsic ureteral compression at the level of mid ureter. 3.  Successful replacement of left ureteral stent.  INDICATIONS:  The patient is a pleasant 57 year old lady with an unusual history of bilateral hydronephrosis, partially atrophic left kidney and periureteral neoplasm.  She initially underwent biopsy of these areas which suggested leiomyoma as etiology  and underwent actually robotic ureterolysis bilaterally several months ago. The right periureteral neoplasm was successfully removed.  The left ureteral neoplasm was completely encompassing her ureter as well as the iliac vessels, making removal  essentially impossible.  Final pathology from this actually suggested severe endometriosis as the etiology. Her remaining ovary has since been removed, and we are optimistic that without estrogen, her endometriosis will improve, this improving her  obstruction.  She presents today for repeat endoscopic evaluation with cystoscopy, retrograde and ureteral stent exchange versus removal.  Informed consent was obtained and placed in medical record.  PROCEDURE IN DETAIL:  The patient being verified, procedure being cystoscopy, bilateral retrograde,  bilateral ureteral stent exchange versus removal was confirmed.  Procedure timeout was performed.  Intravenous antibiotics were administered.  General  anesthesia was induced.  The patient was placed into a low lithotomy position.  Sterile field was created, prepped and draped the patient's vagina, introitus, and proximal thighs using iodine.  Cystourethroscopy performed using 21-French rigid cystoscope  with offset lens.  Inspection of the urinary bladder revealed distal end of bilateral stents in situ.  They were grasped and brought out in their entirety and set aside for discard.  They were inspected and intact.  The right ureteral orifice was  cannulated with 6-French end-hole catheter, and right retrograde pyelogram was obtained.  Right retrograde pyelogram demonstrated a single right ureter, single system right kidney.  No filling defects or narrowing noted, whatsoever.  No hydronephrosis, whatsoever.  There was as expected resolution of her previous right sided extrinsic  compression following her previous removal of the right ureteral mass.  It was therefore felt that the right ureteral stent could remain out.  Next, left retrograde pyelogram was obtained.  Left retrograde pyelogram demonstrated single left ureter, single system left kidney.  There was an area in the mid ureter of narrowing for a distance of approximately 2-3 cm consistent with some persistence of extrinsic compression on the left side,  therefore felt that exchange of stent in the left would be most prudent and a sensor wire was advanced to level of the upper pole and a new 6 x 24 contour type stent was carefully placed using fluoroscopic guidance.  Good proximal and distal planes were  noted.  Procedure was terminated.  The patient tolerated procedure well, no immediate perioperative complications.  The patient taken to postanesthesia care unit in stable condition.  Plan for discharge home after void.   [facility] D: 12/18/2022  8:13:15 am T: 12/18/2022 8:56:00  am  JOB: [id number]/ [id number]

## 2023-01-12 ENCOUNTER — Other Ambulatory Visit: Payer: Federal, State, Local not specified - PPO

## 2023-03-17 ENCOUNTER — Other Ambulatory Visit: Payer: Federal, State, Local not specified - PPO

## 2023-03-17 ENCOUNTER — Ambulatory Visit
Admission: RE | Admit: 2023-03-17 | Discharge: 2023-03-17 | Disposition: A | Discharge status: Home / Self Care | Payer: Federal, State, Local not specified - PPO | Source: Ambulatory Visit | Attending: Family Medicine | Admitting: Family Medicine

## 2023-03-17 DIAGNOSIS — E042 Nontoxic multinodular goiter: Secondary | ICD-10-CM | POA: Diagnosis not present

## 2023-03-17 DIAGNOSIS — E041 Nontoxic single thyroid nodule: Secondary | ICD-10-CM

## 2023-04-14 DIAGNOSIS — Z01419 Encounter for gynecological examination (general) (routine) without abnormal findings: Secondary | ICD-10-CM | POA: Diagnosis not present

## 2023-04-26 DIAGNOSIS — N1831 Chronic kidney disease, stage 3a: Secondary | ICD-10-CM | POA: Diagnosis not present

## 2023-04-26 DIAGNOSIS — N130 Hydronephrosis with ureteropelvic junction obstruction: Secondary | ICD-10-CM | POA: Diagnosis not present

## 2023-05-28 ENCOUNTER — Other Ambulatory Visit: Payer: Self-pay | Admitting: Urology

## 2023-06-03 DIAGNOSIS — Z1231 Encounter for screening mammogram for malignant neoplasm of breast: Secondary | ICD-10-CM | POA: Diagnosis not present

## 2023-06-15 DIAGNOSIS — N130 Hydronephrosis with ureteropelvic junction obstruction: Secondary | ICD-10-CM | POA: Diagnosis not present

## 2023-06-15 DIAGNOSIS — C669 Malignant neoplasm of unspecified ureter: Secondary | ICD-10-CM | POA: Diagnosis not present

## 2023-06-28 DIAGNOSIS — L720 Epidermal cyst: Secondary | ICD-10-CM | POA: Diagnosis not present

## 2023-06-29 NOTE — Progress Notes (Addendum)
 COVID Vaccine Completed:  Yes  Date of COVID positive in last 90 days:  PCP - Dion Saucier, MD Cardiologist -   Chest x-ray -  EKG -  Stress Test -  ECHO -  Cardiac Cath -  Pacemaker/ICD device last checked: Spinal Cord Stimulator:  Bowel Prep -   Sleep Study -  CPAP -   Fasting Blood Sugar -  Checks Blood Sugar _____ times a day  Last dose of GLP1 agonist-  N/A GLP1 instructions:  Hold 7 days before surgery    Last dose of SGLT-2 inhibitors-  N/A SGLT-2 instructions:  Hold 3 days before surgery    Blood Thinner Instructions:  Last dose:   Time: Aspirin Instructions: Last Dose:  Activity level:  Can go up a flight of stairs and perform activities of daily living without stopping and without symptoms of chest pain or shortness of breath.  Able to exercise without symptoms  Unable to go up a flight of stairs without symptoms of     Anesthesia review:    Patient denies shortness of breath, fever, cough and chest pain at PAT appointment  Patient verbalized understanding of instructions that were given to them at the PAT appointment. Patient was also instructed that they will need to review over the PAT instructions again at home before surgery.

## 2023-06-29 NOTE — Patient Instructions (Signed)
 SURGICAL WAITING ROOM VISITATION Patients having surgery or a procedure may have no more than 2 support people in the waiting area - these visitors may rotate.    Children under the age of 22 must have an adult with them who is not the patient.  Due to an increase in RSV and influenza rates and associated hospitalizations, children ages 15 and under may not visit patients in [facility] hospitals.   If the patient needs to stay at the hospital during part of their recovery, the visitor guidelines for inpatient rooms apply. Pre-op nurse will coordinate an appropriate time for 1 support person to accompany patient in pre-op.  This support person may not rotate.    Please refer to the [facility] website for the visitor guidelines for Inpatients (after your surgery is over and you are in a regular room).       Your procedure is scheduled on: 07-07-23   Report to [facility] Main Entrance    Report to admitting at 12:45 PM   Call this number if you have problems the morning of surgery [phone]   Do not eat food or drink liquids :After Midnight.           If you have questions, please contact your surgeon's office.   FOLLOW ANY ADDITIONAL PRE OP INSTRUCTIONS YOU RECEIVED FROM YOUR SURGEON'S OFFICE!!!     Oral Hygiene is also important to reduce your risk of infection.                                    Remember - BRUSH YOUR TEETH THE MORNING OF SURGERY WITH YOUR REGULAR TOOTHPASTE   Do NOT smoke after Midnight   Take these medicines the morning of surgery with A SIP OF WATER:    Amlodipine   Flonase nasal spray  Stop all vitamins and herbal supplements 7 days before surgery  Bring CPAP mask and tubing day of surgery.                              You may not have any metal on your body including hair pins, jewelry, and body piercing             Do not wear make-up, lotions, powders, perfumes, or deodorant  Do not wear nail polish including gel and S&S,  artificial/acrylic nails, or any other type of covering on natural nails including finger and toenails. If you have artificial nails, gel coating, etc. that needs to be removed by a nail salon please have this removed prior to surgery or surgery may need to be canceled/ delayed if the surgeon/ anesthesia feels like they are unable to be safely monitored.   Do not shave  48 hours prior to surgery.          Do not bring valuables to the hospital.  IS NOT RESPONSIBLE   FOR VALUABLES.   Contacts, dentures or bridgework may not be worn into surgery.  DO NOT BRING YOUR HOME MEDICATIONS TO THE HOSPITAL. PHARMACY WILL DISPENSE MEDICATIONS LISTED ON YOUR MEDICATION LIST TO YOU DURING YOUR ADMISSION IN THE HOSPITAL!    Patients discharged on the day of surgery will not be allowed to drive home.  Someone NEEDS to stay with you for the first 24 hours after anesthesia.   Special Instructions: Bring a copy of your healthcare  power of attorney and living will documents the day of surgery if you haven't scanned them before.              Please read over the following fact sheets you were given: IF YOU HAVE QUESTIONS ABOUT YOUR PRE-OP INSTRUCTIONS PLEASE CALL [phone] Gwen  If you received a COVID test during your pre-op visit  it is requested that you wear a mask when out in public, stay away from anyone that may not be feeling well and notify your surgeon if you develop symptoms. If you test positive for Covid or have been in contact with anyone that has tested positive in the last 10 days please notify you surgeon.  [facility] - Preparing for Surgery Before surgery, you can play an important role.  Because skin is not sterile, your skin needs to be as free of germs as possible.  You can reduce the number of germs on your skin by washing with CHG (chlorahexidine gluconate) soap before surgery.  CHG is an antiseptic cleaner which kills germs and bonds with the skin to continue killing germs even  after washing. Please DO NOT use if you have an allergy to CHG or antibacterial soaps.  If your skin becomes reddened/irritated stop using the CHG and inform your nurse when you arrive at Short Stay. Do not shave (including legs and underarms) for at least 48 hours prior to the first CHG shower.  You may shave your face/neck.  Please follow these instructions carefully:  1.  Shower with CHG Soap the night before surgery and the  morning of surgery.  2.  If you choose to wash your hair, wash your hair first as usual with your normal  shampoo.  3.  After you shampoo, rinse your hair and body thoroughly to remove the shampoo.                             4.  Use CHG as you would any other liquid soap.  You can apply chg directly to the skin and wash.  Gently with a scrungie or clean washcloth.  5.  Apply the CHG Soap to your body ONLY FROM THE NECK DOWN.   Do   not use on face/ open                           Wound or open sores. Avoid contact with eyes, ears mouth and   genitals (private parts).                       Wash face,  Genitals (private parts) with your normal soap.             6.  Wash thoroughly, paying special attention to the area where your    surgery  will be performed.  7.  Thoroughly rinse your body with warm water from the neck down.  8.  DO NOT shower/wash with your normal soap after using and rinsing off the CHG Soap.                9.  Pat yourself dry with a clean towel.            10.  Wear clean pajamas.            11.  Place clean sheets on your bed the night of your first shower and do  not  sleep with pets. Day of Surgery : Do not apply any lotions/deodorants the morning of surgery.  Please wear clean clothes to the hospital/surgery center.  FAILURE TO FOLLOW THESE INSTRUCTIONS MAY RESULT IN THE CANCELLATION OF YOUR SURGERY  PATIENT SIGNATURE_________________________________  NURSE  SIGNATURE__________________________________  ________________________________________________________________________

## 2023-07-01 ENCOUNTER — Encounter (HOSPITAL_COMMUNITY)
Admission: RE | Admit: 2023-07-01 | Discharge: 2023-07-01 | Disposition: A | Discharge status: Home / Self Care | Payer: Federal, State, Local not specified - PPO | Source: Ambulatory Visit | Attending: Urology | Admitting: Urology

## 2023-07-01 ENCOUNTER — Other Ambulatory Visit: Payer: Self-pay

## 2023-07-01 ENCOUNTER — Encounter (HOSPITAL_COMMUNITY): Payer: Self-pay

## 2023-07-01 VITALS — BP 151/94 | HR 78 | Temp 98.9°F | Resp 16 | Ht 66.0 in | Wt 211.0 lb

## 2023-07-01 DIAGNOSIS — Z01818 Encounter for other preprocedural examination: Secondary | ICD-10-CM | POA: Diagnosis not present

## 2023-07-01 DIAGNOSIS — I10 Essential (primary) hypertension: Secondary | ICD-10-CM | POA: Insufficient documentation

## 2023-07-01 LAB — BASIC METABOLIC PANEL
Anion gap: 12 (ref 5–15)
BUN: 17 mg/dL (ref 6–20)
CO2: 25 mmol/L (ref 22–32)
Calcium: 9.1 mg/dL (ref 8.9–10.3)
Chloride: 98 mmol/L (ref 98–111)
Creatinine, Ser: 1.19 mg/dL — ABNORMAL HIGH (ref 0.44–1.00)
GFR, Estimated: 54 mL/min — ABNORMAL LOW (ref >60)
Glucose, Bld: 99 mg/dL (ref 70–99)
Potassium: 4.2 mmol/L (ref 3.5–5.1)
Sodium: 135 mmol/L (ref 135–145)

## 2023-07-07 ENCOUNTER — Ambulatory Visit (HOSPITAL_COMMUNITY)

## 2023-07-07 ENCOUNTER — Encounter (HOSPITAL_COMMUNITY)
Admission: RE | Disposition: A | Discharge status: Home / Self Care | Payer: Self-pay | Source: Home / Self Care | Attending: Urology

## 2023-07-07 ENCOUNTER — Ambulatory Visit (HOSPITAL_COMMUNITY): Admitting: Anesthesiology

## 2023-07-07 ENCOUNTER — Ambulatory Visit (HOSPITAL_COMMUNITY)
Admission: RE | Admit: 2023-07-07 | Discharge: 2023-07-07 | Disposition: A | Discharge status: Home / Self Care | Payer: Federal, State, Local not specified - PPO | Attending: Urology | Admitting: Urology

## 2023-07-07 ENCOUNTER — Encounter (HOSPITAL_COMMUNITY): Payer: Self-pay | Admitting: Urology

## 2023-07-07 ENCOUNTER — Other Ambulatory Visit: Payer: Self-pay

## 2023-07-07 DIAGNOSIS — N1330 Unspecified hydronephrosis: Secondary | ICD-10-CM | POA: Diagnosis not present

## 2023-07-07 DIAGNOSIS — N135 Crossing vessel and stricture of ureter without hydronephrosis: Secondary | ICD-10-CM | POA: Diagnosis not present

## 2023-07-07 DIAGNOSIS — N183 Chronic kidney disease, stage 3 unspecified: Secondary | ICD-10-CM | POA: Diagnosis not present

## 2023-07-07 DIAGNOSIS — I129 Hypertensive chronic kidney disease with stage 1 through stage 4 chronic kidney disease, or unspecified chronic kidney disease: Secondary | ICD-10-CM | POA: Insufficient documentation

## 2023-07-07 HISTORY — PX: CYSTOSCOPY/URETEROSCOPY/HOLMIUM LASER: SHX6545

## 2023-07-07 SURGERY — CYSTOURETEROSCOPY, USING HOLMIUM LASER
Anesthesia: General | Laterality: Left

## 2023-07-07 MED ORDER — ONDANSETRON HCL 4 MG/2ML IJ SOLN
INTRAMUSCULAR | Status: AC
  Filled 2023-07-07: qty 2

## 2023-07-07 MED ORDER — GENTAMICIN SULFATE 40 MG/ML IJ SOLN
360.0000 mg | INTRAVENOUS | Status: AC
  Administered 2023-07-07: 360 mg via INTRAVENOUS
  Filled 2023-07-07: qty 9

## 2023-07-07 MED ORDER — DROPERIDOL 2.5 MG/ML IJ SOLN: 0.6250 mg | Freq: Once | INTRAMUSCULAR | Status: DC | PRN

## 2023-07-07 MED ORDER — ONDANSETRON HCL 4 MG/2ML IJ SOLN: 4.0000 mg | Freq: Once | INTRAMUSCULAR | Status: DC | PRN

## 2023-07-07 MED ORDER — DEXAMETHASONE SODIUM PHOSPHATE 10 MG/ML IJ SOLN
INTRAMUSCULAR | Status: DC | PRN
  Administered 2023-07-07: 10 mg via INTRAVENOUS

## 2023-07-07 MED ORDER — PROPOFOL 10 MG/ML IV BOLUS
INTRAVENOUS | Status: AC
  Filled 2023-07-07: qty 20

## 2023-07-07 MED ORDER — OXYCODONE HCL 5 MG PO TABS: 5.0000 mg | ORAL_TABLET | Freq: Once | ORAL | Status: DC | PRN

## 2023-07-07 MED ORDER — LACTATED RINGERS IV SOLN: INTRAVENOUS | Status: DC

## 2023-07-07 MED ORDER — FENTANYL CITRATE (PF) 100 MCG/2ML IJ SOLN
INTRAMUSCULAR | Status: AC
  Filled 2023-07-07: qty 2

## 2023-07-07 MED ORDER — CHLORHEXIDINE GLUCONATE 0.12 % MT SOLN
15.0000 mL | Freq: Once | OROMUCOSAL | Status: AC
  Administered 2023-07-07: 15 mL via OROMUCOSAL

## 2023-07-07 MED ORDER — PROPOFOL 10 MG/ML IV BOLUS
INTRAVENOUS | Status: DC | PRN
  Administered 2023-07-07: 160 mg via INTRAVENOUS

## 2023-07-07 MED ORDER — SODIUM CHLORIDE 0.9 % IR SOLN
Status: DC | PRN
  Administered 2023-07-07: 3000 mL via INTRAVESICAL

## 2023-07-07 MED ORDER — LIDOCAINE HCL (PF) 2 % IJ SOLN
INTRAMUSCULAR | Status: AC
  Filled 2023-07-07: qty 5

## 2023-07-07 MED ORDER — HYDROMORPHONE HCL 1 MG/ML IJ SOLN: 0.2500 mg | INTRAMUSCULAR | Status: DC | PRN

## 2023-07-07 MED ORDER — LIDOCAINE 2% (20 MG/ML) 5 ML SYRINGE
INTRAMUSCULAR | Status: DC | PRN
  Administered 2023-07-07: 100 mg via INTRAVENOUS

## 2023-07-07 MED ORDER — FENTANYL CITRATE (PF) 100 MCG/2ML IJ SOLN
INTRAMUSCULAR | Status: DC | PRN
Start: 2023-07-07 — End: 2023-07-07
  Administered 2023-07-07: 100 ug via INTRAVENOUS

## 2023-07-07 MED ORDER — OXYCODONE HCL 5 MG/5ML PO SOLN: 5.0000 mg | Freq: Once | ORAL | Status: DC | PRN

## 2023-07-07 MED ORDER — MIDAZOLAM HCL 5 MG/5ML IJ SOLN
INTRAMUSCULAR | Status: DC | PRN
  Administered 2023-07-07: 2 mg via INTRAVENOUS

## 2023-07-07 MED ORDER — DEXAMETHASONE SODIUM PHOSPHATE 10 MG/ML IJ SOLN
INTRAMUSCULAR | Status: AC
  Filled 2023-07-07: qty 1

## 2023-07-07 MED ORDER — ONDANSETRON HCL 4 MG/2ML IJ SOLN
INTRAMUSCULAR | Status: DC | PRN
  Administered 2023-07-07: 4 mg via INTRAVENOUS

## 2023-07-07 MED ORDER — IOHEXOL 300 MG/ML  SOLN
INTRAMUSCULAR | Status: DC | PRN
  Administered 2023-07-07: 30 mL via URETHRAL

## 2023-07-07 MED ORDER — MIDAZOLAM HCL 2 MG/2ML IJ SOLN
INTRAMUSCULAR | Status: AC
  Filled 2023-07-07: qty 2

## 2023-07-07 MED ORDER — ORAL CARE MOUTH RINSE: 15.0000 mL | Freq: Once | OROMUCOSAL | Status: AC

## 2023-07-07 SURGICAL SUPPLY — 20 items
BAG URO CATCHER STRL LF (MISCELLANEOUS) ×1 IMPLANT
BASKET LASER NITINOL 1.9FR (BASKET) IMPLANT
CATH URETL OPEN END 6FR 70 (CATHETERS) ×1 IMPLANT
CLOTH BEACON ORANGE TIMEOUT ST (SAFETY) ×1 IMPLANT
EXTRACTOR STONE 1.7FRX115CM (UROLOGICAL SUPPLIES) IMPLANT
GLOVE SURG LX STRL 7.5 STRW (GLOVE) ×1 IMPLANT
GOWN STRL REUS W/ TWL XL LVL3 (GOWN DISPOSABLE) ×1 IMPLANT
GUIDEWIRE ANG ZIPWIRE 038X150 (WIRE) ×1 IMPLANT
GUIDEWIRE STR DUAL SENSOR (WIRE) ×1 IMPLANT
KIT TURNOVER KIT A (KITS) IMPLANT
LASER FIB FLEXIVA PULSE ID 365 (Laser) IMPLANT
MANIFOLD NEPTUNE II (INSTRUMENTS) ×1 IMPLANT
PACK CYSTO (CUSTOM PROCEDURE TRAY) ×1 IMPLANT
SHEATH NAVIGATOR HD 11/13X28 (SHEATH) IMPLANT
SHEATH NAVIGATOR HD 11/13X36 (SHEATH) IMPLANT
STENT URET 6FRX24 CONTOUR (STENTS) IMPLANT
TRACTIP FLEXIVA PULS ID 200XHI (Laser) IMPLANT
TUBE PU 8FR 16IN ENFIT (TUBING) ×1 IMPLANT
TUBING CONNECTING 10 (TUBING) ×1 IMPLANT
TUBING UROLOGY SET (TUBING) ×1 IMPLANT

## 2023-07-07 NOTE — Discharge Instructions (Signed)
 1 - You may have urinary urgency (bladder spasms) and bloody urine on / off with stent in place. This is normal.  2 - Call MD or go to ER for fever >102, severe pain / nausea / vomiting not relieved by medications, or acute change in medical status

## 2023-07-07 NOTE — Transfer of Care (Signed)
 Immediate Anesthesia Transfer of Care Note  Patient: [name]  Procedure(s) Performed: CYSTOSCOPY/BILATERAL RETROGRADE PYELOGRAM/LEFT STENT EXCHANGE (Left)  Patient Location: PACU  Anesthesia Type:General  Level of Consciousness: sedated  Airway & Oxygen Therapy: Patient Spontanous Breathing and Patient connected to face mask oxygen  Post-op Assessment: Report given to RN and Post -op Vital signs reviewed and stable  Post vital signs: Reviewed and stable  Last Vitals:  Vitals Value Taken Time  BP 132/79 07/07/23 1450  Temp    Pulse 78 07/07/23 1451  Resp 16 07/07/23 1451  SpO2 100 % 07/07/23 1451  Vitals shown include unfiled device data.  Last Pain:  Vitals:   07/07/23 1321  TempSrc: Oral  PainSc: 0-No pain         Complications: No notable events documented.

## 2023-07-07 NOTE — Anesthesia Postprocedure Evaluation (Signed)
 Anesthesia Post Note  Patient: [name]  Procedure(s) Performed: CYSTOSCOPY/BILATERAL RETROGRADE PYELOGRAM/LEFT STENT EXCHANGE (Left)     Patient location during evaluation: PACU Anesthesia Type: General Level of consciousness: awake and alert and oriented Pain management: pain level controlled Vital Signs Assessment: post-procedure vital signs reviewed and stable Respiratory status: spontaneous breathing, nonlabored ventilation and respiratory function stable Cardiovascular status: blood pressure returned to baseline and stable Postop Assessment: no apparent nausea or vomiting Anesthetic complications: no   No notable events documented.  Last Vitals:  Vitals:   07/07/23 1500 07/07/23 1515  BP: (!) 141/88 (!) 144/78  Pulse: 80 72  Resp: 12 14  Temp:    SpO2: 94% 94%    Last Pain:  Vitals:   07/07/23 1515  TempSrc:   PainSc: 0-No pain                 Reita Shindler A.

## 2023-07-07 NOTE — Anesthesia Preprocedure Evaluation (Addendum)
 Anesthesia Evaluation  Patient identified by MRN, date of birth, ID band Patient awake    Reviewed: Allergy & Precautions, NPO status , Patient's Chart, lab work & pertinent test results, reviewed documented beta blocker date and time   History of Anesthesia Complications Negative for: history of anesthetic complications  Airway Mallampati: I  TM Distance: >3 FB Neck ROM: Full   Comment: Previous grade I view with MAC 3, easy mask Dental  (+) Dental Advisory Given   Pulmonary neg pulmonary ROS   Pulmonary exam normal breath sounds clear to auscultation       Cardiovascular hypertension, Pt. on medications (-) angina (-) Past MI, (-) Cardiac Stents and (-) CABG (-) dysrhythmias  Rhythm:Regular Rate:Normal  Ectatic abdominal aorta   Neuro/Psych negative neurological ROS     GI/Hepatic negative GI ROS,,,Hepatic steatosis   Endo/Other  neg diabetes  Left thyroid nodule  Renal/GU Renal InsufficiencyRenal diseaseHydronephrosis left kidney   Hx/o bilateral ureteral masses S/P excisiton    Musculoskeletal Dermatofibrosarcoma pubic area    Abdominal  (+) + obese  Peds  Hematology  (+) Blood dyscrasia (congenital neutropenia) Hx/o congenital neutropenia   Anesthesia Other Findings   Reproductive/Obstetrics                             Anesthesia Physical Anesthesia Plan  ASA: 3  Anesthesia Plan: General   Post-op Pain Management: Tylenol PO (pre-op)*   Induction: Intravenous  PONV Risk Score and Plan: 3 and Ondansetron, Dexamethasone and Treatment may vary due to age or medical condition  Airway Management Planned: LMA  Additional Equipment: None  Intra-op Plan:   Post-operative Plan: Extubation in OR  Informed Consent: I have reviewed the patients History and Physical, chart, labs and discussed the procedure including the risks, benefits and alternatives for the proposed anesthesia  with the patient or authorized representative who has indicated his/her understanding and acceptance.     Dental advisory given  Plan Discussed with: CRNA and Anesthesiologist  Anesthesia Plan Comments: ( )       Anesthesia Quick Evaluation

## 2023-07-07 NOTE — Brief Op Note (Signed)
 07/07/2023  2:47 PM  PATIENT:  [name]  58 y.o. female  PRE-OPERATIVE DIAGNOSIS:  LEFT HYDRONEPHROSIS  POST-OPERATIVE DIAGNOSIS:  LEFT HYDRONEPHROSIS  PROCEDURE:  Procedure(s) with comments: CYSTOSCOPY/BILATERAL RETROGRADE PYELOGRAM/LEFT STENT EXCHANGE (Left) - 45 MINUTES NEEDED FOR CASE  SURGEON:  Surgeons and Role:    * Manny, Delbert Phenix., MD - Primary  PHYSICIAN ASSISTANT:   ASSISTANTS: none   ANESTHESIA:   general  EBL:  minimal   BLOOD ADMINISTERED:none  DRAINS: none   LOCAL MEDICATIONS USED:  NONE  SPECIMEN:  Source of Specimen:  left ureteral stent  DISPOSITION OF SPECIMEN:   discard  COUNTS:  YES  TOURNIQUET:  * No tourniquets in log *  DICTATION: .Other Dictation: Dictation Number [id number]  PLAN OF CARE: Discharge to home after PACU  PATIENT DISPOSITION:  PACU - hemodynamically stable.   Delay start of Pharmacological VTE agent (>24hrs) due to surgical blood loss or risk of bleeding: yes

## 2023-07-07 NOTE — Anesthesia Procedure Notes (Signed)
 Procedure Name: LMA Insertion Date/Time: 07/07/2023 2:25 PM  Performed by: Doran Clay, CRNAPre-anesthesia Checklist: Patient identified, Emergency Drugs available, Suction available, Patient being monitored and Timeout performed Patient Re-evaluated:Patient Re-evaluated prior to induction Oxygen Delivery Method: Circle system utilized Preoxygenation: Pre-oxygenation with 100% oxygen Induction Type: IV induction LMA: LMA inserted LMA Size: 4.0 Tube type: Oral Number of attempts: 1 Placement Confirmation: positive ETCO2 and breath sounds checked- equal and bilateral Tube secured with: Tape Dental Injury: Teeth and Oropharynx as per pre-operative assessment

## 2023-07-08 ENCOUNTER — Encounter (HOSPITAL_COMMUNITY): Payer: Self-pay | Admitting: Urology

## 2023-07-08 NOTE — Op Note (Signed)
 NAME: [name], [name] MEDICAL RECORD NO: [id number] ACCOUNT NO: [id number] DATE OF BIRTH: August 01, 1965 FACILITY: [facility] LOCATION: [facility] PHYSICIAN: Sebastian Ache, MD  Operative Report   SURGEON:  Sebastian Ache, MD  PREOPERATIVE DIAGNOSIS:  Left hydronephrosis due to severe endometriosis implants.  POSTOPERATIVE DIAGNOSES:  Left hydronephrosis due to severe endometriosis implants.  PROCEDURE PERFORMED: 1.  Cystoscopy with bilateral retrograde pyelograms and interpretation. 2.  Exchange of left ureteral stent.  ESTIMATED BLOOD LOSS: Nil.  COMPLICATIONS: None.  SPECIMENS:  Left ureteral stent for discard.  FINDINGS: 1.  Unremarkable right retrograde pyelogram. 2.  Left retrograde pyelogram with persistent left mid ureteral narrowing, estimated 4-5 cm segment. 3.  Successful replacement of left ureteral stent, proximal end in renal pelvis and distal end in urinary bladder.  INDICATION FOR PROCEDURE:  The patient is a pleasant 58 year old lady with a history of bilateral periureteral masses. She underwent resection of her right mass previously that was quite successful at relieving her obstruction.  The pathology was most  consistent with severe endometrial implants.  She was unfortunately unable to have her left ureter completely lysed of the endometrial implants as it was completely encased and also encasing her major vessels including her aorta. As such, she has been  managed with chronic stenting on her left side with exchanges approximately every 6 months to date.  It has been approximately 6 months since her most recent stent change.  She presents for this today.  Informed consent was obtained and placed in the  medical record.  PROCEDURE IN DETAIL:  The patient being, [name] verified and procedure being left ureteral stent exchange versus removal, and bilateral retrograde was confirmed.  Procedure timeout was performed.  Intravenous antibiotics were administered.    General LMA anesthesia induced.  The patient was placed into a low lithotomy position.  Sterile field was created, prepped and draped the patient's vagina, introitus, and proximal thighs using iodine.  Cystourethroscopy was performed using 21-French  rigid cystoscope with offset lens.  Inspection of urinary bladder revealed no diverticula, calcifications, or papillary lesions.  Distal end of the left ureteral stent was in situ.  The right ureteral orifice was cannulated with a 6-French open-ended  catheter and right retrograde pyelogram was obtained.  Right retrograde pyelogram demonstrated a single right ureter, single system right kidney.  No filling defects were noted.  The distal end of the left stent was grasped and brought out in its entirety, set aside for discard.  There was minimal encrustation suggesting that her stent change intervals be pushed out even to yearly.  The left ureteral orifice was cannulated with 6-French  end-hole catheter and a left retrograde pyelogram was obtained.  Left retrograde pyelogram demonstrated single left ureter, single system left kidney.  There was significant narrowing and shotty filling effects in the left mid ureter consistent with known extrinsic obstruction.  Given the persistence of this, it was  clearly felt that exchange of left stent was warranted.  A 0.038 Zip wire was advanced to the upper pole and a new 6 x 24 contour type stent was carefully placed using fluoroscopic guidance.  Good proximal and distal plane were noted.  The procedure was  terminated.  The patient tolerated the procedure well no immediate periprocedural complications.  The patient taken to postanesthesia care unit in stable condition.   NIK D: 07/07/2023 2:52:20 pm T: 07/08/2023 12:23:00 am  JOB: [id number]/ [id number]

## 2023-08-30 ENCOUNTER — Other Ambulatory Visit: Payer: Self-pay | Admitting: Family Medicine

## 2023-08-30 DIAGNOSIS — I10 Essential (primary) hypertension: Secondary | ICD-10-CM

## 2023-08-31 NOTE — Telephone Encounter (Signed)
 Needs appt. Per 09/09/22 OV notes, pt was to return around 01/01/23 for CPE and fasting labs.   Request denied.

## 2023-09-07 ENCOUNTER — Other Ambulatory Visit: Payer: Self-pay | Admitting: Family Medicine

## 2023-09-07 DIAGNOSIS — I10 Essential (primary) hypertension: Secondary | ICD-10-CM

## 2023-11-22 DIAGNOSIS — L720 Epidermal cyst: Secondary | ICD-10-CM | POA: Diagnosis not present

## 2023-11-22 DIAGNOSIS — L732 Hidradenitis suppurativa: Secondary | ICD-10-CM | POA: Diagnosis not present

## 2023-11-29 ENCOUNTER — Encounter: Admitting: Family Medicine

## 2023-12-27 DIAGNOSIS — N130 Hydronephrosis with ureteropelvic junction obstruction: Secondary | ICD-10-CM | POA: Diagnosis not present

## 2023-12-27 DIAGNOSIS — C669 Malignant neoplasm of unspecified ureter: Secondary | ICD-10-CM | POA: Diagnosis not present

## 2023-12-28 ENCOUNTER — Other Ambulatory Visit: Payer: Self-pay | Admitting: Urology

## 2024-01-06 ENCOUNTER — Other Ambulatory Visit: Payer: Self-pay | Admitting: Family Medicine

## 2024-01-06 DIAGNOSIS — I10 Essential (primary) hypertension: Secondary | ICD-10-CM

## 2024-01-06 MED ORDER — AMLODIPINE BESYLATE 5 MG PO TABS: ORAL_TABLET | ORAL | 0 refills | Status: DC

## 2024-01-06 NOTE — Telephone Encounter (Signed)
 Copied from CRM [id number]. Topic: Clinical - Medication Refill >> Jan 06, 2024 11:11 AM [place] J wrote: Medication: amLODipine  (NORVASC ) 5 MG tablet  Has the patient contacted their pharmacy? No (Agent: If no, request that the patient contact the pharmacy for the refill. If patient does not wish to contact the pharmacy document the reason why and proceed with request.) (Agent: If yes, when and what did the pharmacy advise?) Patient and I were scheduling an appointment and she remembered that she needed a refill as well.  This is the patient's preferred pharmacy:  [facility] [address], [place] - [address] [place] [address] [address] Phone: [phone] Fax: [phone]  Is this the correct pharmacy for this prescription? Yes If no, delete pharmacy and type the correct one.   Has the prescription been filled recently? No  Is the patient out of the medication? No  Has the patient been seen for an appointment in the last year OR does the patient have an upcoming appointment? Yes  Can we respond through MyChart? Yes  Agent: Please be advised that Rx refills may take up to 3 business days. We ask that you follow-up with your pharmacy.

## 2024-01-13 DIAGNOSIS — R229 Localized swelling, mass and lump, unspecified: Secondary | ICD-10-CM | POA: Diagnosis not present

## 2024-01-20 NOTE — Patient Instructions (Signed)
 SURGICAL WAITING ROOM VISITATION  Patients having surgery or a procedure may have no more than 2 support people in the waiting area - these visitors may rotate.    Children under the age of 35 must have an adult with them who is not the patient.  Visitors with respiratory illnesses are discouraged from visiting and should remain at home.  If the patient needs to stay at the hospital during part of their recovery, the visitor guidelines for inpatient rooms apply. Pre-op nurse will coordinate an appropriate time for 1 support person to accompany patient in pre-op.  This support person may not rotate.    Please refer to the [facility] website for the visitor guidelines for Inpatients (after your surgery is over and you are in a regular room).       Your procedure is scheduled on: 01-26-24    Report to [facility] Main Entrance    Report to admitting at       0700  AM   Call this number if you have problems the morning of surgery [phone]   Do not eat food :After Midnight.                          If you have questions, please contact your surgeon's office.   FOLLOW ANY ADDITIONAL PRE OP INSTRUCTIONS YOU RECEIVED FROM YOUR SURGEON'S OFFICE!!!     Oral Hygiene is also important to reduce your risk of infection.                                    Remember - BRUSH YOUR TEETH THE MORNING OF SURGERY WITH YOUR REGULAR TOOTHPASTE  DENTURES WILL BE REMOVED PRIOR TO SURGERY PLEASE DO NOT APPLY Poly grip OR ADHESIVES!!!   Do NOT smoke after Midnight   Stop all vitamins and herbal supplements 7 days before surgery.   Take these medicines the morning of surgery with A SIP OF WATER : amlodipine   DO NOT TAKE ANY ORAL DIABETIC MEDICATIONS DAY OF YOUR SURGERY  Bring CPAP mask and tubing day of surgery.                              You may not have any metal on your body including hair pins, jewelry, and body piercing             Do not wear make-up, lotions, powders,  perfumes/cologne, or deodorant  Do not wear nail polish including gel and S&S, artificial/acrylic nails, or any other type of covering on natural nails including finger and toenails. If you have artificial nails, gel coating, etc. that needs to be removed by a nail salon please have this removed prior to surgery or surgery may need to be canceled/ delayed if the surgeon/ anesthesia feels like they are unable to be safely monitored.   Do not shave  48 hours prior to surgery.              Do not bring valuables to the hospital. [facility] IS NOT             RESPONSIBLE   FOR VALUABLES.   Contacts, glasses, dentures or bridgework may not be worn into surgery.   Bring small overnight bag day of surgery.   DO NOT BRING YOUR HOME MEDICATIONS TO THE HOSPITAL. PHARMACY WILL  DISPENSE MEDICATIONS LISTED ON YOUR MEDICATION LIST TO YOU DURING YOUR ADMISSION IN THE HOSPITAL!    Patients discharged on the day of surgery will not be allowed to drive home.  Someone NEEDS to stay with you for the first 24 hours after anesthesia.   Special Instructions: Bring a copy of your healthcare power of attorney and living will documents the day of surgery if you haven't scanned them before.              Please read over the following fact sheets you were given: IF YOU HAVE QUESTIONS ABOUT YOUR PRE-OP INSTRUCTIONS PLEASE CALL [phone].    If you test positive for Covid or have been in contact with anyone that has tested positive in the last 10 days please notify you surgeon.    [facility] - Preparing for Surgery Before surgery, you can play an important role.  Because skin is not sterile, your skin needs to be as free of germs as possible.  You can reduce the number of germs on your skin by washing with CHG (chlorahexidine gluconate) soap before surgery.  CHG is an antiseptic cleaner which kills germs and bonds with the skin to continue killing germs even after washing. Please DO NOT use if you have an allergy to  CHG or antibacterial soaps.  If your skin becomes reddened/irritated stop using the CHG and inform your nurse when you arrive at Short Stay. Do not shave (including legs and underarms) for at least 48 hours prior to the first CHG shower.  You may shave your face/neck. Please follow these instructions carefully:  1.  Shower with CHG Soap the night before surgery and the  morning of Surgery.  2.  If you choose to wash your hair, wash your hair first as usual with your  normal  shampoo.  3.  After you shampoo, rinse your hair and body thoroughly to remove the  shampoo.                            4.  Use CHG as you would any other liquid soap.  You can apply chg directly  to the skin and wash                       Gently with a scrungie or clean washcloth.  5.  Apply the CHG Soap to your body ONLY FROM THE NECK DOWN.   Do not use on face/ open                           Wound or open sores. Avoid contact with eyes, ears mouth and genitals (private parts).                       Wash face,  Genitals (private parts) with your normal soap.             6.  Wash thoroughly, paying special attention to the area where your surgery  will be performed.  7.  Thoroughly rinse your body with warm water  from the neck down.  8.  DO NOT shower/wash with your normal soap after using and rinsing off  the CHG Soap.                9.  Pat yourself dry with a clean towel.  10.  Wear clean pajamas.            11.  Place clean sheets on your bed the night of your first shower and do not  sleep with pets. Day of Surgery : Do not apply any lotions/deodorants the morning of surgery.  Please wear clean clothes to the hospital/surgery center.  FAILURE TO FOLLOW THESE INSTRUCTIONS MAY RESULT IN THE CANCELLATION OF YOUR SURGERY PATIENT SIGNATURE_________________________________  NURSE SIGNATURE__________________________________  ________________________________________________________________________

## 2024-01-20 NOTE — Telephone Encounter (Signed)
 Name and DOB were by the patient. She wants Dr. Mollie to be aware that she has an appointment with Dr. Lang.

## 2024-01-20 NOTE — Progress Notes (Addendum)
 PCP -  Cardiologist -   PPM/ICD -  Device Orders -  Rep Notified -   Chest x-ray -  EKG - 07-08-23 epic Stress Test -  ECHO -  Cardiac Cath -   Sleep Study -  CPAP -   Fasting Blood Sugar -  Checks Blood Sugar _____ times a day  Blood Thinner Instructions: Aspirin Instructions:  ERAS Protcol - PRE-SURGERY n/a   COVID vaccine -  Activity-- Anesthesia review: HTN, CKD   Patient denies shortness of breath, fever, cough and chest pain at PAT appointment   All instructions explained to the patient, with a verbal understanding of the material. Patient agrees to go over the instructions while at home for a better understanding. Patient also instructed to self quarantine after being tested for COVID-19. The opportunity to ask questions was provided.

## 2024-01-21 ENCOUNTER — Encounter (HOSPITAL_COMMUNITY)
Admission: RE | Admit: 2024-01-21 | Discharge: 2024-01-21 | Disposition: A | Discharge status: Home / Self Care | Source: Ambulatory Visit | Attending: Anesthesiology | Admitting: Anesthesiology

## 2024-01-21 DIAGNOSIS — I10 Essential (primary) hypertension: Secondary | ICD-10-CM

## 2024-01-26 ENCOUNTER — Ambulatory Visit (HOSPITAL_COMMUNITY): Admission: RE | Admit: 2024-01-26 | Source: Home / Self Care | Admitting: Urology

## 2024-01-26 ENCOUNTER — Encounter (HOSPITAL_COMMUNITY): Admission: RE | Payer: Self-pay | Source: Home / Self Care

## 2024-01-26 SURGERY — CYSTOSCOPY, WITH RETROGRADE PYELOGRAM
Anesthesia: General | Laterality: Left

## 2024-02-11 DIAGNOSIS — R229 Localized swelling, mass and lump, unspecified: Secondary | ICD-10-CM | POA: Diagnosis not present

## 2024-02-11 DIAGNOSIS — C44599 Other specified malignant neoplasm of skin of other part of trunk: Secondary | ICD-10-CM | POA: Diagnosis not present

## 2024-02-11 DIAGNOSIS — I10 Essential (primary) hypertension: Secondary | ICD-10-CM | POA: Diagnosis not present

## 2024-02-15 ENCOUNTER — Ambulatory Visit (INDEPENDENT_AMBULATORY_CARE_PROVIDER_SITE_OTHER): Admitting: Family Medicine

## 2024-02-15 ENCOUNTER — Encounter: Payer: Self-pay | Admitting: Family Medicine

## 2024-02-15 VITALS — BP 148/82 | HR 76 | Temp 98.5°F | Ht 66.0 in | Wt 212.1 lb

## 2024-02-15 DIAGNOSIS — Z90710 Acquired absence of both cervix and uterus: Secondary | ICD-10-CM

## 2024-02-15 DIAGNOSIS — E785 Hyperlipidemia, unspecified: Secondary | ICD-10-CM | POA: Diagnosis not present

## 2024-02-15 DIAGNOSIS — N139 Obstructive and reflux uropathy, unspecified: Secondary | ICD-10-CM

## 2024-02-15 DIAGNOSIS — E559 Vitamin D deficiency, unspecified: Secondary | ICD-10-CM | POA: Diagnosis not present

## 2024-02-15 DIAGNOSIS — D700 Congenital agranulocytosis: Secondary | ICD-10-CM

## 2024-02-15 DIAGNOSIS — N1832 Chronic kidney disease, stage 3b: Secondary | ICD-10-CM | POA: Diagnosis not present

## 2024-02-15 DIAGNOSIS — E041 Nontoxic single thyroid nodule: Secondary | ICD-10-CM | POA: Diagnosis not present

## 2024-02-15 DIAGNOSIS — N261 Atrophy of kidney (terminal): Secondary | ICD-10-CM

## 2024-02-15 DIAGNOSIS — K5900 Constipation, unspecified: Secondary | ICD-10-CM

## 2024-02-15 DIAGNOSIS — Z0001 Encounter for general adult medical examination with abnormal findings: Secondary | ICD-10-CM

## 2024-02-15 DIAGNOSIS — I10 Essential (primary) hypertension: Secondary | ICD-10-CM | POA: Diagnosis not present

## 2024-02-15 DIAGNOSIS — E66811 Obesity, class 1: Secondary | ICD-10-CM

## 2024-02-15 LAB — CBC WITH DIFFERENTIAL/PLATELET
Basophils Absolute: 0 K/uL (ref 0.0–0.1)
Basophils Relative: 0.8 % (ref 0.0–3.0)
Eosinophils Absolute: 0.1 K/uL (ref 0.0–0.7)
Eosinophils Relative: 1.7 % (ref 0.0–5.0)
HCT: 41.6 % (ref 36.0–46.0)
Hemoglobin: 13.9 g/dL (ref 12.0–15.0)
Lymphocytes Relative: 61.3 % — ABNORMAL HIGH (ref 12.0–46.0)
Lymphs Abs: 3 K/uL (ref 0.7–4.0)
MCHC: 33.5 g/dL (ref 30.0–36.0)
MCV: 88 fl (ref 78.0–100.0)
Monocytes Absolute: 0.3 K/uL (ref 0.1–1.0)
Monocytes Relative: 6.6 % (ref 3.0–12.0)
Neutro Abs: 1.5 K/uL (ref 1.4–7.7)
Neutrophils Relative %: 29.6 % — ABNORMAL LOW (ref 43.0–77.0)
Platelets: 252 K/uL (ref 150.0–400.0)
RBC: 4.73 Mil/uL (ref 3.87–5.11)
RDW: 12.6 % (ref 11.5–15.5)
WBC: 4.9 K/uL (ref 4.0–10.5)

## 2024-02-15 LAB — TSH: TSH: 1.34 u[IU]/mL (ref 0.35–5.50)

## 2024-02-15 LAB — LIPID PANEL
Cholesterol: 181 mg/dL (ref 0–200)
HDL: 75.4 mg/dL (ref >39.00)
LDL Cholesterol: 62 mg/dL (ref 0–99)
NonHDL: 106.04
Total CHOL/HDL Ratio: 2
Triglycerides: 221 mg/dL — ABNORMAL HIGH (ref 0.0–149.0)
VLDL: 44.2 mg/dL — ABNORMAL HIGH (ref 0.0–40.0)

## 2024-02-15 LAB — COMPREHENSIVE METABOLIC PANEL WITH GFR
ALT: 18 U/L (ref 0–35)
AST: 18 U/L (ref 0–37)
Albumin: 4.9 g/dL (ref 3.5–5.2)
Alkaline Phosphatase: 68 U/L (ref 39–117)
BUN: 12 mg/dL (ref 6–23)
CO2: 32 meq/L (ref 19–32)
Calcium: 9.7 mg/dL (ref 8.4–10.5)
Chloride: 98 meq/L (ref 96–112)
Creatinine, Ser: 1.08 mg/dL (ref 0.40–1.20)
GFR: 57.59 mL/min — ABNORMAL LOW (ref >60.00)
Glucose, Bld: 98 mg/dL (ref 70–99)
Potassium: 3.7 meq/L (ref 3.5–5.1)
Sodium: 139 meq/L (ref 135–145)
Total Bilirubin: 0.4 mg/dL (ref 0.2–1.2)
Total Protein: 7.9 g/dL (ref 6.0–8.3)

## 2024-02-15 LAB — PHOSPHORUS: Phosphorus: 4.3 mg/dL (ref 2.3–4.6)

## 2024-02-15 LAB — MICROALBUMIN / CREATININE URINE RATIO
Creatinine,U: 20.9 mg/dL
Microalb Creat Ratio: 43 mg/g — ABNORMAL HIGH (ref 0.0–30.0)
Microalb, Ur: 0.9 mg/dL (ref 0.0–1.9)

## 2024-02-15 LAB — VITAMIN D 25 HYDROXY (VIT D DEFICIENCY, FRACTURES): VITD: 25.75 ng/mL — ABNORMAL LOW (ref 30.00–100.00)

## 2024-02-15 MED ORDER — LOSARTAN POTASSIUM 25 MG PO TABS: 25.0000 mg | ORAL_TABLET | Freq: Every day | ORAL | 3 refills | Status: AC

## 2024-02-15 MED ORDER — AMLODIPINE BESYLATE 10 MG PO TABS: 10.0000 mg | ORAL_TABLET | Freq: Every day | ORAL | 3 refills | Status: AC

## 2024-02-15 NOTE — Patient Instructions (Addendum)
 Labs today  Take amlodipine  10mg  daily in the morning.  Add losartan 25mg  daily in the morning. Schedule lab visit in 10 days to recheck kidneys.  We will request latest mamm[facility] for [facility]) Consider tetanus, shingles, pneumonia shots.  I will order updated thyroid  ultrasound after 03/04/2024 to monitor left nodule. You may call [facility] next month to schedule ultrasound after 03/16/2024. [phone]  Good to see you today  Return as needed or in 1 year for next physical

## 2024-02-15 NOTE — Assessment & Plan Note (Signed)
 Preventative protocols reviewed and updated unless pt declined. Discussed healthy diet and lifestyle.

## 2024-02-15 NOTE — Progress Notes (Unsigned)
 Ph: [phone] Fax: [phone]   Patient ID: [name], female    DOB: June 01, 1965, 58 y.o.   MRN: [id number]  This visit was conducted in person.  BP (!) 148/82   Pulse 76   Temp 98.5 F (36.9 C) (Oral)   Ht 5' 6 (1.676 m)   Wt 212 lb 2 oz (96.2 kg)   LMP 06/25/2017   SpO2 97%   BMI 34.24 kg/m   156/84 on repeat testing   CC: CPE Subjective:   HPI: [name] is a 58 y.o. female presenting on 02/15/2024 for Annual Exam   Epidermoid cyst to mons pubis - sees derm. Treated with clindamycin topical, ILK to area, doxycycline without benefit. Underwent excision of perineal tumor measuring 10x6.5cm by plastic surgery Dr Lang 02/11/2024 at [facility]. Pathology pending.   HTN - continues amlodipine  5mg  daily. Home BP readings running 130-140s/80s   CKD with L hydronephrosis, MR abdomen/pelvis showed L>R periureteral soft tissue masses - saw urology Dr Alvaro who completed resection of R mass, but unable to resect left side (07/17/2022). Bilateral ureteral stents placed. ?fibroid and/or endometriosis.  Stent exchange at OR q6-9 months.  R ovary removed by urology - hopeful for involution of tissue.   Chronic lymphocytosis with mild neutropenia - saw hematology Dr Federico who thought this was due to chronic benign neutropenia,. 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   Thyroid  US  01/2022 - multinodular thyroid  gland with 2.7cm L lower thyroid  nodule recommended biopsy - still pending. She declines biopsy at this time but agrees to rpt thyroid  US .  Thyroid  US  03/2023 - similar appearance, continued recommendation for FNA biopsy of mildly suspicious L lower pole thyroid  nodule (2.5cm).   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   [facility] and [facility].   Preventative: COLONOSCOPY 10/2019 - benign polyp, rpt 10 yrs Haematologist) Well woman with OBGYN (Dr Johnnye at [facility])  yearly, last seen 2024 - normal paps in the past, h/o endometrial ablation. S/p TAH and RSO 07/2017 for fibroids. R ovary removed by urology 2024.  Mammogram - through OBGYN office - 2024. Requested today  LMP - 07/2017.  Lung cancer screening - not eligible  Flu - declines COVID vaccine - pfizer x2 07/2019, booster 04/2020 Tdap - declines  Prevnar-20 - discussed, declines  Shingrix - discussed, declines Seat belt use discussed Sunscreen use discussed. No changing moles on skin. Saw dermatologist.  Sleep - averaging 7 hours/night  Non smoker Alcohol - rare Dentist q36mo  Eye exam yearly  Constipation - ?amlodipine  related, managing with fiber and metamucil with benefit.   Caffeine: 1 cup coffee/day Lives with husband, 3 sons Occupation: works for [employer] Activity: going to gym 3x/wk - cardio , playing pickleball Diet: good water , fruits and vegetables daily      Relevant past medical, surgical, family and social history reviewed and updated as indicated. Interim medical history since our last visit reviewed. Allergies and medications reviewed and updated. Outpatient Medications Prior to Visit  Medication Sig Dispense Refill   fluticasone  (FLONASE ) 50 MCG/ACT nasal spray Use 2 spray(s) in each nostril once daily (Patient taking differently: Place 1 spray into both nostrils daily as needed for allergies.) 48 g 0   levocetirizine (XYZAL ) 5 MG tablet TAKE 1 TABLET BY MOUTH ONCE DAILY IN THE EVENING (Patient taking differently: Take 5 mg by mouth at bedtime as needed for allergies.) 90 tablet 0  Multiple Vitamin (MULTIVITAMIN) capsule Take 1 capsule by mouth daily.     amLODipine  (NORVASC ) 5 MG tablet TAKE 1 TABLET BY MOUTH IN THE MORNING AND AT BEDTIME 180 tablet 0   traMADol  (ULTRAM ) 50 MG tablet Take 1 tablet (50 mg total) by mouth every 6 (six) hours as needed for moderate pain (post-operaively). (Patient not taking: Reported on 02/15/2024) 10 tablet 0   No facility-administered  medications prior to visit.     Per HPI unless specifically indicated in ROS section below Review of Systems  Constitutional:  Negative for activity change, appetite change, chills, fatigue, fever and unexpected weight change.  HENT:  Negative for hearing loss.   Eyes:  Negative for visual disturbance.  Respiratory:  Negative for cough, chest tightness, shortness of breath and wheezing.   Cardiovascular:  Negative for chest pain, palpitations and leg swelling.  Gastrointestinal:  Negative for abdominal distention, abdominal pain, blood in stool, constipation, diarrhea, nausea and vomiting.  Genitourinary:  Negative for difficulty urinating and hematuria.  Musculoskeletal:  Negative for arthralgias, myalgias and neck pain.  Skin:  Negative for rash.  Neurological:  Negative for dizziness, seizures, syncope and headaches.  Hematological:  Negative for adenopathy. Does not bruise/bleed easily.  Psychiatric/Behavioral:  Negative for dysphoric mood. The patient is not nervous/anxious.     Objective:  BP (!) 148/82   Pulse 76   Temp 98.5 F (36.9 C) (Oral)   Ht 5' 6 (1.676 m)   Wt 212 lb 2 oz (96.2 kg)   LMP 06/25/2017   SpO2 97%   BMI 34.24 kg/m   Wt Readings from Last 3 Encounters:  02/15/24 212 lb 2 oz (96.2 kg)  07/07/23 210 lb 15.7 oz (95.7 kg)  07/01/23 211 lb (95.7 kg)      Physical Exam Vitals and nursing note reviewed.  Constitutional:      Appearance: Normal appearance. She is not ill-appearing.  HENT:     Head: Normocephalic and atraumatic.     Right Ear: Tympanic membrane, ear canal and external ear normal. There is no impacted cerumen.     Left Ear: Tympanic membrane, ear canal and external ear normal. There is no impacted cerumen.     Mouth/Throat:     Mouth: Mucous membranes are moist.     Pharynx: Oropharynx is clear. No oropharyngeal exudate or posterior oropharyngeal erythema.  Eyes:     General:        Right eye: No discharge.        Left eye: No  discharge.     Extraocular Movements: Extraocular movements intact.     Conjunctiva/sclera: Conjunctivae normal.     Pupils: Pupils are equal, round, and reactive to light.  Neck:     Thyroid : No thyroid  mass or thyromegaly.  Cardiovascular:     Rate and Rhythm: Normal rate and regular rhythm.     Pulses: Normal pulses.     Heart sounds: Normal heart sounds. No murmur heard. Pulmonary:     Effort: Pulmonary effort is normal. No respiratory distress.     Breath sounds: Normal breath sounds. No wheezing, rhonchi or rales.  Abdominal:     General: Bowel sounds are normal. There is no distension.     Palpations: Abdomen is soft. There is no mass.     Tenderness: There is no abdominal tenderness. There is no guarding or rebound.     Hernia: No hernia is present.  Musculoskeletal:     Cervical back: Normal range of motion  and neck supple. No rigidity.     Right lower leg: No edema.     Left lower leg: No edema.  Lymphadenopathy:     Cervical: No cervical adenopathy.  Skin:    General: Skin is warm and dry.     Findings: No rash.  Neurological:     General: No focal deficit present.     Mental Status: She is alert. Mental status is at baseline.  Psychiatric:        Mood and Affect: Mood normal.        Behavior: Behavior normal.       Results for orders placed or performed during the hospital encounter of 07/01/23  Basic metabolic panel per protocol   Collection Time: 07/01/23  9:20 AM  Result Value Ref Range   Sodium 135 135 - 145 mmol/L   Potassium 4.2 3.5 - 5.1 mmol/L   Chloride 98 98 - 111 mmol/L   CO2 25 22 - 32 mmol/L   Glucose, Bld 99 70 - 99 mg/dL   BUN 17 6 - 20 mg/dL   Creatinine, Ser 8.80 (H) 0.44 - 1.00 mg/dL   Calcium 9.1 8.9 - 89.6 mg/dL   GFR, Estimated 54 (L) >60 mL/min   Anion gap 12 5 - 15    Assessment & Plan:   Problem List Items Addressed This Visit     Encounter for general adult medical examination with abnormal findings - Primary (Chronic)    Preventative protocols reviewed and updated unless pt declined. Discussed healthy diet and lifestyle.       Vitamin D  deficiency   Relevant Orders   VITAMIN D  25 Hydroxy (Vit-D Deficiency, Fractures)   Essential hypertension, benign   Relevant Medications   amLODipine  (NORVASC ) 10 MG tablet   losartan (COZAAR) 25 MG tablet   Other Relevant Orders   Microalbumin / creatinine urine ratio   Basic metabolic panel with GFR   Dyslipidemia   Relevant Orders   Lipid panel   Comprehensive metabolic panel with GFR   CKD (chronic kidney disease) stage 3, GFR 30-59 ml/min (HCC)   Relevant Orders   Comprehensive metabolic panel with GFR   Phosphorus   VITAMIN D  25 Hydroxy (Vit-D Deficiency, Fractures)   Microalbumin / creatinine urine ratio   Parathyroid  hormone, intact (no Ca)   CBC with Differential/Platelet   Basic metabolic panel with GFR   Thyroid  nodule   Relevant Orders   TSH     Meds ordered this encounter  Medications   amLODipine  (NORVASC ) 10 MG tablet    Sig: Take 1 tablet (10 mg total) by mouth daily.    Dispense:  90 tablet    Refill:  3    Note new dose   losartan (COZAAR) 25 MG tablet    Sig: Take 1 tablet (25 mg total) by mouth daily.    Dispense:  90 tablet    Refill:  3    In addition to amlodipine     Orders Placed This Encounter  Procedures   Lipid panel   Comprehensive metabolic panel with GFR   Phosphorus   VITAMIN D  25 Hydroxy (Vit-D Deficiency, Fractures)   Microalbumin / creatinine urine ratio   Parathyroid  hormone, intact (no Ca)   CBC with Differential/Platelet   TSH   Basic metabolic panel with GFR    Standing Status:   Future    Expiration Date:   02/14/2025    Patient Instructions  Labs today  Take amlodipine  10mg  daily in  the morning.  Add losartan 25mg  daily in the morning. Schedule lab visit in 10 days to recheck kidneys.  We will request latest mammogram for [facility]) Consider tetanus, shingles,  pneumonia shots.  I will order updated thyroid  ultrasound after 03/04/2024 to monitor left nodule. You may call [facility] next month to schedule ultrasound after 03/16/2024. [phone]  Good to see you today  Return as needed or in 1 year for next physical   Follow up plan: Return in about 1 year (around 02/14/2025) for annual exam, prior fasting for blood work.  Anton Blas, MD

## 2024-02-16 LAB — PARATHYROID HORMONE, INTACT (NO CA): PTH: 28 pg/mL (ref 16–77)

## 2024-02-17 NOTE — Assessment & Plan Note (Signed)
 Now both ovaries removed since 07/2022

## 2024-02-17 NOTE — Assessment & Plan Note (Signed)
 Chronic off meds. Update FLP. The 10-year ASCVD risk score (Arnett DK, et al., 2019) is: 5.9%   Values used to calculate the score:     Age: 58 years     Clincally relevant sex: Female     Is Non-Hispanic African American: Yes     Diabetic: No     Tobacco smoker: No     Systolic Blood Pressure: 148 mmHg     Is BP treated: Yes     HDL Cholesterol: 75.4 mg/dL     Total Cholesterol: 181 mg/dL

## 2024-02-17 NOTE — Assessment & Plan Note (Signed)
 Encouraged continue regular physical activity and healthy diet choices.

## 2024-02-17 NOTE — Assessment & Plan Note (Signed)
 Update CBC.

## 2024-02-17 NOTE — Assessment & Plan Note (Signed)
?  amlodipine  related - well managed with fiber

## 2024-02-17 NOTE — Assessment & Plan Note (Signed)
 Update levels rec 1000 international units  daily.

## 2024-02-17 NOTE — Assessment & Plan Note (Addendum)
 Chronic, BP remains elevated in office and at home despite amlodipine  5mg  bid.  Change amlodipine  to 10mg  in am. Start low dose losartan 25mg  daily. RTC 10d BMP Continued monitoring at home and update if staying above goal

## 2024-02-17 NOTE — Assessment & Plan Note (Addendum)
 H/o multinodular thyroid  gland with 2.5cm L lower nodule - recommend biopsy. Pt has declined.  She agrees to repeat thyroid  US  which I will order around 03/16/2024

## 2024-02-17 NOTE — Assessment & Plan Note (Addendum)
 Update renal panel in setting of obstructive L renal atrophy s/p R>L periureteral mass resection 07/2022.  Discussed likely goal BP <130/80.  Start low dose losartan 25mg  daily. RTC 10 d BMP.

## 2024-02-18 ENCOUNTER — Telehealth: Payer: Self-pay

## 2024-02-18 NOTE — Telephone Encounter (Signed)
 Left message to return call to our office. Sending my chart message to patient as well.  Please provide message from provider/office when call is returned from patient.

## 2024-02-18 NOTE — Telephone Encounter (Signed)
 Patient return call and made aware of chart notations. Patient states she will give them a call to reschedule.

## 2024-02-18 NOTE — Telephone Encounter (Signed)
-----   Message from Anton Blas sent at 02/17/2024  4:19 PM EDT ----- Regarding: thyroid  US  Hi!  I ordered a thyroid  ultrasound on this patient and in scheduling section requested: to schedule this for after 03/16/2024 (1 year after last US ). However referral team has scheduled pt for 02/21/2024. How can we go about getting this rescheduled for after 03/16/2024 for patient? Thanks! Javier

## 2024-02-20 ENCOUNTER — Ambulatory Visit: Payer: Self-pay | Admitting: Family Medicine

## 2024-02-20 MED ORDER — VITAMIN D3 25 MCG (1000 UT) PO CAPS: 1.0000 | ORAL_CAPSULE | Freq: Every day | ORAL | Status: AC

## 2024-02-21 ENCOUNTER — Other Ambulatory Visit

## 2024-02-22 DIAGNOSIS — C4499 Other specified malignant neoplasm of skin, unspecified: Secondary | ICD-10-CM | POA: Diagnosis not present

## 2024-02-25 ENCOUNTER — Other Ambulatory Visit (INDEPENDENT_AMBULATORY_CARE_PROVIDER_SITE_OTHER)

## 2024-02-25 DIAGNOSIS — I10 Essential (primary) hypertension: Secondary | ICD-10-CM | POA: Diagnosis not present

## 2024-02-25 DIAGNOSIS — N1832 Chronic kidney disease, stage 3b: Secondary | ICD-10-CM

## 2024-02-25 LAB — BASIC METABOLIC PANEL WITH GFR
BUN: 17 mg/dL (ref 6–23)
CO2: 29 meq/L (ref 19–32)
Calcium: 9.1 mg/dL (ref 8.4–10.5)
Chloride: 102 meq/L (ref 96–112)
Creatinine, Ser: 1.22 mg/dL — ABNORMAL HIGH (ref 0.40–1.20)
GFR: 49.74 mL/min — ABNORMAL LOW (ref >60.00)
Glucose, Bld: 131 mg/dL — ABNORMAL HIGH (ref 70–99)
Potassium: 4 meq/L (ref 3.5–5.1)
Sodium: 140 meq/L (ref 135–145)

## 2024-03-06 ENCOUNTER — Other Ambulatory Visit: Payer: Self-pay | Admitting: Urology

## 2024-03-06 DIAGNOSIS — C4499 Other specified malignant neoplasm of skin, unspecified: Secondary | ICD-10-CM | POA: Diagnosis not present

## 2024-03-08 DIAGNOSIS — C4499 Other specified malignant neoplasm of skin, unspecified: Secondary | ICD-10-CM | POA: Diagnosis not present

## 2024-03-16 DIAGNOSIS — C4499 Other specified malignant neoplasm of skin, unspecified: Secondary | ICD-10-CM | POA: Diagnosis not present

## 2024-04-02 NOTE — Patient Instructions (Addendum)
 SURGICAL WAITING ROOM VISITATION Patients having surgery or a procedure may have no more than 2 support people in the waiting area - these visitors may rotate in the visitor waiting room.   If the patient needs to stay at the hospital during part of their recovery, the visitor guidelines for inpatient rooms apply.  PRE-OP VISITATION  Pre-op nurse will coordinate an appropriate time for 1 support person to accompany the patient in pre-op.  This support person may not rotate.  This visitor will be contacted when the time is appropriate for the visitor to come back in the pre-op area.  Please refer to the [facility] website for the visitor guidelines for Inpatients (after your surgery is over and you are in a regular room).  You are not required to quarantine at this time prior to your surgery. However, you must do this: Hand Hygiene often Do NOT share personal items Notify your provider if you are in close contact with someone who has COVID or you develop fever 100.4 or greater, new onset of sneezing, cough, sore throat, shortness of breath or body aches.  If you test positive for Covid or have been in contact with anyone that has tested positive in the last 10 days please notify you surgeon.    Your procedure is scheduled on:  Wednesday 04-12-24  Report to [facility] Main Entrance: [address] entrance where the [employer] is available.   Report to admitting at: 11:45    AM  Call this number if you have any questions or problems the morning of surgery [phone]  DO NOT EAT OR DRINK ANYTHING AFTER MIDNIGHT THE NIGHT PRIOR TO YOUR SURGERY / PROCEDURE.   FOLLOW  ANY ADDITIONAL PRE OP INSTRUCTIONS YOU RECEIVED FROM YOUR SURGEON'S OFFICE!!!   Oral Hygiene is also important to reduce your risk of infection.        Remember - BRUSH YOUR TEETH THE MORNING OF SURGERY WITH YOUR REGULAR TOOTHPASTE  Do NOT smoke after Midnight the night before surgery.  STOP TAKING all Vitamins,  Herbs and supplements 1 week before your surgery.   Take ONLY these medicines the morning of surgery with A SIP OF WATER :   Amlodipine , and Flonase  Nasal Spray if needed.   DO NOT TAKE losartan  the morning of your surgery.   You may not have any metal on your body including hair pins, jewelry, and body piercing  Do not wear make-up, lotions, powders, perfumes, or deodorant  Do not wear nail polish including gel and S&S, artificial / acrylic nails, or any other type of covering on natural nails including finger and toenails. If you have artificial nails, gel coating, etc., that needs to be removed by a nail salon, Please have this removed prior to surgery. Not doing so may mean that your surgery could be cancelled or delayed if the Surgeon or anesthesia staff feels like they are unable to monitor you safely.   Do not shave 48 hours prior to surgery to avoid nicks in your skin which may contribute to postoperative infections.   Contacts, Hearing Aids, dentures or bridgework may not be worn into surgery. DENTURES WILL BE REMOVED PRIOR TO SURGERY PLEASE DO NOT APPLY Poly grip OR ADHESIVES!!!  Patients discharged on the day of surgery will not be allowed to drive home.  Someone NEEDS to stay with you for the first 24 hours after anesthesia.  Do not bring your home medications to the hospital. The Pharmacy will dispense medications listed on  your medication list to you during your admission in the Hospital.  Please read over the following fact sheets you were given: IF YOU HAVE QUESTIONS ABOUT YOUR PRE-OP INSTRUCTIONS, PLEASE CALL [phone].   [place] - Preparing for Surgery      Before surgery, you can play an important role.  Because skin is not sterile, your skin needs to be as free of germs as possible.  You can reduce the number of germs on your skin by washing with CHG (chlorahexidine gluconate) soap before surgery.  CHG is an antiseptic cleaner which kills germs and bonds with the  skin to continue killing germs even after washing. Please DO NOT use if you have an allergy to CHG or antibacterial soaps.  If your skin becomes reddened/irritated stop using the CHG and inform your nurse when you arrive at Short Stay. Do not shave (including legs and underarms) for at least 48 hours prior to the first CHG shower.  You may shave your face/neck.  Please follow these instructions carefully:  1.  Shower with CHG Soap the night before surgery ONLY (DO NOT USE THE CHG SOAP THE MORNING OF SURGERY).  2.  If you choose to wash your hair, wash your hair first as usual with your normal  shampoo.  3.  After you shampoo, rinse your hair and body thoroughly to remove the shampoo.                             4.  Use CHG as you would any other liquid soap.  You can apply chg directly to the skin and wash.  Gently with a scrungie or clean washcloth.  5.  Apply the CHG Soap to your body ONLY FROM THE NECK DOWN.   Do not use on face/ open                           Wound or open sores. Avoid contact with eyes, ears mouth and genitals (private parts).                       Wash face,  Genitals (private parts) with your normal soap.             6.  Wash thoroughly, paying special attention to the area where your  surgery  will be performed.  7.  Thoroughly rinse your body with warm water  from the neck down.  8.  DO NOT shower/wash with your normal soap after using and rinsing off the CHG Soap.                9.  Pat yourself dry with a clean towel.            10.  Wear clean pajamas.            11.  Place clean sheets on your bed the night of your first shower and do not  sleep with pets.  Day of Surgery : Do not apply any CHG, lotions/deodorants the morning of surgery.  Please wear clean clothes to the hospital/surgery center.   FAILURE TO FOLLOW THESE INSTRUCTIONS MAY RESULT IN THE CANCELLATION OF YOUR SURGERY  PATIENT SIGNATURE_________________________________  NURSE  SIGNATURE__________________________________  ________________________________________________________________________

## 2024-04-02 NOTE — Progress Notes (Signed)
 COVID Vaccine received:  []  No [x]  Yes Date of any COVID positive Test in last 90 days:  PCP - Anton Blas, MD  Cardiologist -   Chest x-ray -  EKG -  07-08-23  Epic Stress Test -  ECHO -  Cardiac Cath -  CT Coronary Calcium score:   Pacemaker / ICD device [x]  No []  Yes   Spinal Cord Stimulator:[x]  No []  Yes       History of Sleep Apnea? [x]  No []  Yes   CPAP used?- [x]  No []  Yes    Medication on DOS:  Amlodipine , Flonase  Nasal Spray   Hold DOS:  losartan ,   Patient has: [x]  NO Hx DM   []  Pre-DM   []  DM1  []   DM2 Does the patient monitor blood sugar?   [x]  N/A   []  No []  Yes   Blood Thinner / Instructions:  none Aspirin Instructions:   none  Activity level: Able to walk up 2 flights of stairs without becoming significantly short of breath or having chest pain?  []  No   []    Yes  Patient can perform ADLs without assistance. 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  Patient verbalized understanding and agreement to the Pre-Surgical Instructions that were given to them at this PAT appointment. Patient was also educated of the need to review these PAT instructions again prior to her surgery.I reviewed the appropriate phone numbers to call if they have any and questions or concerns.

## 2024-04-04 ENCOUNTER — Encounter (HOSPITAL_COMMUNITY): Payer: Self-pay

## 2024-04-04 ENCOUNTER — Other Ambulatory Visit: Payer: Self-pay

## 2024-04-04 ENCOUNTER — Encounter (HOSPITAL_COMMUNITY)
Admission: RE | Admit: 2024-04-04 | Discharge: 2024-04-04 | Disposition: A | Source: Ambulatory Visit | Attending: Urology

## 2024-04-04 DIAGNOSIS — Z01812 Encounter for preprocedural laboratory examination: Secondary | ICD-10-CM | POA: Insufficient documentation

## 2024-04-04 DIAGNOSIS — I10 Essential (primary) hypertension: Secondary | ICD-10-CM | POA: Diagnosis not present

## 2024-04-04 HISTORY — DX: Malignant (primary) neoplasm, unspecified: C80.1

## 2024-04-04 LAB — BASIC METABOLIC PANEL WITH GFR
Anion gap: 11 (ref 5–15)
BUN: 14 mg/dL (ref 6–20)
CO2: 26 mmol/L (ref 22–32)
Calcium: 9.7 mg/dL (ref 8.9–10.3)
Chloride: 102 mmol/L (ref 98–111)
Creatinine, Ser: 1.03 mg/dL — ABNORMAL HIGH (ref 0.44–1.00)
GFR, Estimated: >60 mL/min (ref >60)
Glucose, Bld: 96 mg/dL (ref 70–99)
Potassium: 3.6 mmol/L (ref 3.5–5.1)
Sodium: 139 mmol/L (ref 135–145)

## 2024-04-04 LAB — CBC
HCT: 40.5 % (ref 36.0–46.0)
Hemoglobin: 13.2 g/dL (ref 12.0–15.0)
MCH: 29.4 pg (ref 26.0–34.0)
MCHC: 32.6 g/dL (ref 30.0–36.0)
MCV: 90.2 fL (ref 80.0–100.0)
Platelets: 243 K/uL (ref 150–400)
RBC: 4.49 MIL/uL (ref 3.87–5.11)
RDW: 12.3 % (ref 11.5–15.5)
WBC: 3.9 K/uL — ABNORMAL LOW (ref 4.0–10.5)
nRBC: 0 % (ref 0.0–0.2)

## 2024-04-11 NOTE — Anesthesia Preprocedure Evaluation (Signed)
 Anesthesia Evaluation    Reviewed: Allergy & Precautions, Patient's Chart, lab work & pertinent test results, reviewed documented beta blocker date and time   History of Anesthesia Complications Negative for: history of anesthetic complications  Airway Mallampati: I  TM Distance: >3 FB Neck ROM: Full   Comment: Previous grade I view with MAC 3, easy mask Dental  (+) Dental Advisory Given   Pulmonary neg pulmonary ROS          Cardiovascular hypertension, Pt. on medications (-) angina (-) Past MI, (-) Cardiac Stents and (-) CABG (-) dysrhythmias   Ectatic abdominal aorta   Neuro/Psych negative neurological ROS     GI/Hepatic negative GI ROS,,,Hepatic steatosis   Endo/Other  neg diabetes  Left thyroid  nodule  Renal/GU Renal InsufficiencyRenal diseaseHydronephrosis left kidney   Hx/o bilateral ureteral masses S/P excisiton    Musculoskeletal Dermatofibrosarcoma pubic area    Abdominal   Peds  Hematology  (+) Blood dyscrasia (congenital neutropenia) Hx/o congenital neutropenia   Anesthesia Other Findings   Reproductive/Obstetrics                              Anesthesia Physical Anesthesia Plan  ASA: 2  Anesthesia Plan: General   Post-op Pain Management: Ofirmev  IV (intra-op)*   Induction: Intravenous  PONV Risk Score and Plan: 3 and Treatment may vary due to age or medical condition, Midazolam , Ondansetron  and Dexamethasone   Airway Management Planned: LMA  Additional Equipment: None  Intra-op Plan:   Post-operative Plan: Extubation in OR  Informed Consent:      Dental advisory given  Plan Discussed with:   Anesthesia Plan Comments: ( )        Anesthesia Quick Evaluation

## 2024-04-12 ENCOUNTER — Other Ambulatory Visit: Payer: Self-pay

## 2024-04-12 ENCOUNTER — Encounter (HOSPITAL_COMMUNITY): Payer: Self-pay | Admitting: Urology

## 2024-04-12 ENCOUNTER — Ambulatory Visit (HOSPITAL_COMMUNITY)

## 2024-04-12 ENCOUNTER — Encounter (HOSPITAL_COMMUNITY): Admission: RE | Disposition: A | Payer: Self-pay | Source: Ambulatory Visit | Attending: Urology

## 2024-04-12 ENCOUNTER — Ambulatory Visit (HOSPITAL_COMMUNITY): Payer: Self-pay | Admitting: Anesthesiology

## 2024-04-12 ENCOUNTER — Encounter (HOSPITAL_COMMUNITY): Payer: Self-pay | Admitting: Medical

## 2024-04-12 ENCOUNTER — Ambulatory Visit (HOSPITAL_COMMUNITY)
Admission: RE | Admit: 2024-04-12 | Discharge: 2024-04-12 | Disposition: A | Source: Ambulatory Visit | Attending: Urology | Admitting: Urology

## 2024-04-12 DIAGNOSIS — Z466 Encounter for fitting and adjustment of urinary device: Secondary | ICD-10-CM | POA: Insufficient documentation

## 2024-04-12 DIAGNOSIS — N135 Crossing vessel and stricture of ureter without hydronephrosis: Secondary | ICD-10-CM | POA: Diagnosis not present

## 2024-04-12 DIAGNOSIS — Z8249 Family history of ischemic heart disease and other diseases of the circulatory system: Secondary | ICD-10-CM | POA: Diagnosis not present

## 2024-04-12 DIAGNOSIS — N130 Hydronephrosis with ureteropelvic junction obstruction: Secondary | ICD-10-CM | POA: Diagnosis present

## 2024-04-12 DIAGNOSIS — N809 Endometriosis, unspecified: Secondary | ICD-10-CM | POA: Insufficient documentation

## 2024-04-12 DIAGNOSIS — K760 Fatty (change of) liver, not elsewhere classified: Secondary | ICD-10-CM | POA: Diagnosis not present

## 2024-04-12 DIAGNOSIS — N183 Chronic kidney disease, stage 3 unspecified: Secondary | ICD-10-CM | POA: Insufficient documentation

## 2024-04-12 DIAGNOSIS — Z85828 Personal history of other malignant neoplasm of skin: Secondary | ICD-10-CM | POA: Insufficient documentation

## 2024-04-12 DIAGNOSIS — I129 Hypertensive chronic kidney disease with stage 1 through stage 4 chronic kidney disease, or unspecified chronic kidney disease: Secondary | ICD-10-CM | POA: Insufficient documentation

## 2024-04-12 DIAGNOSIS — Z780 Asymptomatic menopausal state: Secondary | ICD-10-CM | POA: Insufficient documentation

## 2024-04-12 DIAGNOSIS — Z90721 Acquired absence of ovaries, unilateral: Secondary | ICD-10-CM | POA: Diagnosis not present

## 2024-04-12 HISTORY — PX: CYSTOSCOPY/URETEROSCOPY/HOLMIUM LASER/STENT PLACEMENT: SHX6546

## 2024-04-12 HISTORY — PX: CYSTOSCOPY W/ RETROGRADES: SHX1426

## 2024-04-12 SURGERY — CYSTOSCOPY, WITH RETROGRADE PYELOGRAM
Anesthesia: General | Laterality: Left

## 2024-04-12 MED ORDER — PROPOFOL 10 MG/ML IV BOLUS
INTRAVENOUS | Status: AC
  Filled 2024-04-12: qty 20

## 2024-04-12 MED ORDER — FENTANYL CITRATE (PF) 100 MCG/2ML IJ SOLN
INTRAMUSCULAR | Status: AC
  Filled 2024-04-12: qty 2

## 2024-04-12 MED ORDER — ONDANSETRON HCL 4 MG/2ML IJ SOLN
INTRAMUSCULAR | Status: DC | PRN
  Administered 2024-04-12: 4 mg via INTRAVENOUS

## 2024-04-12 MED ORDER — MIDAZOLAM HCL 2 MG/2ML IJ SOLN
INTRAMUSCULAR | Status: AC
  Filled 2024-04-12: qty 2

## 2024-04-12 MED ORDER — ACETAMINOPHEN 10 MG/ML IV SOLN: 1000.0000 mg | Freq: Once | INTRAVENOUS | Status: DC | PRN

## 2024-04-12 MED ORDER — OXYCODONE HCL 5 MG PO TABS: 5.0000 mg | ORAL_TABLET | Freq: Once | ORAL | Status: DC | PRN

## 2024-04-12 MED ORDER — LACTATED RINGERS IV SOLN: INTRAVENOUS | Status: DC

## 2024-04-12 MED ORDER — OXYCODONE HCL 5 MG/5ML PO SOLN: 5.0000 mg | Freq: Once | ORAL | Status: DC | PRN

## 2024-04-12 MED ORDER — IOHEXOL 300 MG/ML  SOLN
INTRAMUSCULAR | Status: DC | PRN
  Administered 2024-04-12: 20 mL

## 2024-04-12 MED ORDER — HYDROMORPHONE HCL 1 MG/ML IJ SOLN: 0.2500 mg | INTRAMUSCULAR | Status: DC | PRN

## 2024-04-12 MED ORDER — ORAL CARE MOUTH RINSE: 15.0000 mL | Freq: Once | OROMUCOSAL | Status: AC

## 2024-04-12 MED ORDER — GENTAMICIN SULFATE 40 MG/ML IJ SOLN
5.0000 mg/kg | INTRAVENOUS | Status: AC
  Administered 2024-04-12: 360 mg via INTRAVENOUS
  Filled 2024-04-12: qty 9

## 2024-04-12 MED ORDER — AMISULPRIDE (ANTIEMETIC) 5 MG/2ML IV SOLN: 10.0000 mg | Freq: Once | INTRAVENOUS | Status: DC | PRN

## 2024-04-12 MED ORDER — MIDAZOLAM HCL (PF) 2 MG/2ML IJ SOLN
INTRAMUSCULAR | Status: DC | PRN
  Administered 2024-04-12: 2 mg via INTRAVENOUS

## 2024-04-12 MED ORDER — TRAMADOL HCL 50 MG PO TABS: 50.0000 mg | ORAL_TABLET | Freq: Four times a day (QID) | ORAL | 0 refills | Status: AC | PRN

## 2024-04-12 MED ORDER — LACTATED RINGERS IV SOLN: INTRAVENOUS | Status: DC | PRN

## 2024-04-12 MED ORDER — ONDANSETRON HCL 4 MG/2ML IJ SOLN: 4.0000 mg | Freq: Once | INTRAMUSCULAR | Status: DC | PRN

## 2024-04-12 MED ORDER — LIDOCAINE HCL (PF) 2 % IJ SOLN
INTRAMUSCULAR | Status: DC | PRN
  Administered 2024-04-12: 100 mg via INTRADERMAL

## 2024-04-12 MED ORDER — LIDOCAINE HCL (PF) 2 % IJ SOLN
INTRAMUSCULAR | Status: AC
  Filled 2024-04-12: qty 5

## 2024-04-12 MED ORDER — DEXAMETHASONE SOD PHOSPHATE PF 10 MG/ML IJ SOLN
INTRAMUSCULAR | Status: DC | PRN
  Administered 2024-04-12: 10 mg via INTRAVENOUS

## 2024-04-12 MED ORDER — PHENYLEPHRINE 80 MCG/ML (10ML) SYRINGE FOR IV PUSH (FOR BLOOD PRESSURE SUPPORT)
PREFILLED_SYRINGE | INTRAVENOUS | Status: DC | PRN
  Administered 2024-04-12: 80 ug via INTRAVENOUS

## 2024-04-12 MED ORDER — SODIUM CHLORIDE 0.9 % IR SOLN
Status: DC | PRN
  Administered 2024-04-12: 3000 mL via INTRAVESICAL

## 2024-04-12 MED ORDER — FENTANYL CITRATE (PF) 100 MCG/2ML IJ SOLN
INTRAMUSCULAR | Status: DC | PRN
  Administered 2024-04-12 (×2): 50 ug via INTRAVENOUS

## 2024-04-12 MED ORDER — CHLORHEXIDINE GLUCONATE 0.12 % MT SOLN
15.0000 mL | Freq: Once | OROMUCOSAL | Status: AC
  Administered 2024-04-12: 15 mL via OROMUCOSAL

## 2024-04-12 MED ORDER — PROPOFOL 10 MG/ML IV BOLUS
INTRAVENOUS | Status: DC | PRN
  Administered 2024-04-12: 150 mg via INTRAVENOUS
  Administered 2024-04-12: 50 mg via INTRAVENOUS

## 2024-04-12 MED ORDER — ONDANSETRON HCL 4 MG/2ML IJ SOLN
INTRAMUSCULAR | Status: AC
  Filled 2024-04-12: qty 2

## 2024-04-12 SURGICAL SUPPLY — 19 items
BAG URO CATCHER STRL LF (MISCELLANEOUS) ×1 IMPLANT
BASKET LASER NITINOL 1.9FR (BASKET) IMPLANT
BASKET ZERO TIP NITINOL 2.4FR (BASKET) IMPLANT
CATH URETL OPEN END 6FR 70 (CATHETERS) ×1 IMPLANT
CLOTH BEACON ORANGE TIMEOUT ST (SAFETY) ×1 IMPLANT
GLOVE SURG LX STRL 7.5 STRW (GLOVE) ×1 IMPLANT
GOWN STRL REUS W/ TWL XL LVL3 (GOWN DISPOSABLE) ×1 IMPLANT
GUIDEWIRE ANG ZIPWIRE 038X150 (WIRE) ×1 IMPLANT
GUIDEWIRE STR DUAL SENSOR (WIRE) ×1 IMPLANT
KIT TURNOVER KIT A (KITS) ×1 IMPLANT
MANIFOLD NEPTUNE II (INSTRUMENTS) ×1 IMPLANT
NS IRRIG 1000ML POUR BTL (IV SOLUTION) IMPLANT
PACK CYSTO (CUSTOM PROCEDURE TRAY) ×1 IMPLANT
SHEATH NAVIGATOR HD 11/13X28 (SHEATH) IMPLANT
SHEATH NAVIGATOR HD 11/13X36 (SHEATH) IMPLANT
TRACTIP FLEXIVA PULS ID 200XHI (Laser) IMPLANT
TUBE PU 8FR 16IN ENFIT (TUBING) ×1 IMPLANT
TUBING CONNECTING 10 (TUBING) ×1 IMPLANT
TUBING UROLOGY SET (TUBING) ×1 IMPLANT

## 2024-04-12 NOTE — Transfer of Care (Signed)
 Immediate Anesthesia Transfer of Care Note  Patient: [name]  Procedure(s) Performed: CYSTOSCOPY, WITH RETROGRADE PYELOGRAM, STENT REMOVAL (Left) DIAGNOSTIC URETEROSCOPY (Left)  Patient Location: PACU  Anesthesia Type:General  Level of Consciousness: awake, alert , and oriented  Airway & Oxygen Therapy: Patient Spontanous Breathing and Patient connected to nasal cannula oxygen  Post-op Assessment: Report given to RN and Post -op Vital signs reviewed and stable  Post vital signs: Reviewed and stable  Last Vitals:  Vitals Value Taken Time  BP 136/73 04/12/24 16:01  Temp    Pulse 87 04/12/24 16:04  Resp 19 04/12/24 16:04  SpO2 100 % 04/12/24 16:04  Vitals shown include unfiled device data.  Last Pain:  Vitals:   04/12/24 1215  TempSrc:   PainSc: 0-No pain      Patients Stated Pain Goal: 5 (04/12/24 1215)  Complications: No notable events documented.

## 2024-04-12 NOTE — Anesthesia Procedure Notes (Signed)
 Procedure Name: LMA Insertion Date/Time: 04/12/2024 3:29 PM  Performed by: Obadiah Reyes BROCKS, CRNAPre-anesthesia Checklist: Patient identified, Emergency Drugs available, Suction available and Patient being monitored Patient Re-evaluated:Patient Re-evaluated prior to induction Oxygen Delivery Method: Circle System Utilized Preoxygenation: Pre-oxygenation with 100% oxygen Induction Type: IV induction Ventilation: Mask ventilation without difficulty LMA: LMA inserted LMA Size: 4.0 Number of attempts: 1 Airway Equipment and Method: Bite block Placement Confirmation: positive ETCO2 Tube secured with: Tape Dental Injury: Teeth and Oropharynx as per pre-operative assessment

## 2024-04-12 NOTE — Discharge Instructions (Addendum)
1 - You may have urinary urgency (bladder spasms) and bloody urine on / off for up to a week. This is normal.  2 - Call MD or go to ER for fever >102, severe pain / nausea / vomiting not relieved by medications, or acute change in medical status

## 2024-04-12 NOTE — Op Note (Signed)
 NAME: JAZZMON, PRINDLE MEDICAL RECORD NO: [id number] ACCOUNT NO: [id number] DATE OF BIRTH: 18-Mar-1966 FACILITY: [facility] LOCATION: [facility] PHYSICIAN: Ricardo Likens, MD  Operative Report   DATE OF PROCEDURE: 04/12/2024  SURGEON:  Ricardo Likens, MD  PREOPERATIVE DIAGNOSIS:  Left ureteral obstruction.  POSTOPERATIVE DIAGNOSIS:  Improved left ureteral obstruction from endometriosis.  PROCEDURES PERFORMED: 1.  Cystoscopy with left retrograde pyelogram with interpretation. 2.  Removal of left ureteral stent. 3.  Left diagnostic ureteroscopy.  ESTIMATED BLOOD LOSS:  Nil.  COMPLICATIONS:  None.  SPECIMENS:  Left ureteral stent for discard.  FINDINGS:  1.  Improved extrinsic compression of left mid ureter over prior evaluations. 2.  Decision made to remove stent.  INDICATIONS:  The patient is a very pleasant 58 year old lady with a history of severe intra-abdominal endometriosis causing bilateral ureteral obstruction.  She is status post a ureterolysis on the right in 2023 and attempted ureterolysis on the left  however, the endometrial reaction was so severe that it encased major vascular structures and was not amenable to ureterolysis.  She is therefore been managed with a chronic stenting changed about every 6 months or so and has been very compliant with  this.  She is now status post oophorectomy and postmenopausal.  There is some hope that the change in hormonal state will cause some atrophy of her endometriosis lesions.  She presents for her stent change versus removal today.  Informed consent was  obtained and placed in the medical record.  DESCRIPTION OF PROCEDURE:  The patient was identified and verified.  Procedure being cysto retrograde, left ureteral stent exchange versus removal was confirmed.  Procedure timeout was performed.  Intravenous antibiotics administered.  General LMA  anesthesia was introduced.  The patient was placed into a low lithotomy position.  A sterile field  was created prepping and draping the patient's vagina, introitus and proximal thighs using iodine.  Cystourethroscopy was performed using a 21-French rigid  cystoscope with offset lens.  Inspection of the bladder revealed an unremarkable right distal ureter.  Distal loop of stent exiting the left ureteral orifice with minimal incrustation.  It was grasped, removed in its entirety and set aside for discard  and inspected and found to be intact.  The left ureteral orifice was then cannulated with a 6-French end-hole catheter and a left retrograde pyelogram was obtained.   The left retrograde pyelogram demonstrated a single left ureter, single system left kidney.  There was some relative narrowing in the mid ureter consistent with known extrinsic compression however this was not severe.  Contrast easily flowed above this  to the area of a relatively non-dilated kidney.  This is favorable.  A 0.038 ZIPwire was advanced to the level of the lower pole and set aside as a safety wire.  Next, a semirigid ureteroscopy was performed of the entire length of the left ureter.  The  ureter revealed some mild relative narrowing at the mid ureter consistent with known extrinsic compression, however this was not high grade or severe and this did appear to be improved over prior evaluations.  I therefore felt that it was certainly worth  a trial of stent removal.  And stent was left out.  We discussed this option with the patient preoperatively and she was certainly amenable to this.  The ureteroscope was removed as was the safety wire.  Bladder was emptied via cystoscope.  Procedure  was then terminated.  The patient tolerated the procedure well.  No immediate periprocedural complications.  The patient  was taken to the postanesthesia care unit in stable condition.  Plan for discharge home and for the evaluation with a repeat renogram  and axial imaging in approximately 6 months.    MUK D: 04/12/2024 3:52:03 pm T:  04/12/2024 9:54:00 pm  JOB: [id number]/ [id number]

## 2024-04-12 NOTE — H&P (Signed)
 [name] is an 58 y.o. female.    Chief Complaint: Pre-OP Cysto, Bilateral Retrogrades, LEFT ureteral stent exchange v. removal  HPI:   1 - BIlateral Peri-Ureteral Masses with Obstruction - s/p robotic/open resection of RIGHT peri-ureteral mass and attempt left mass (unresectable due to encasement of iliacs and IMA) for Very rare L>R peri-uretreral neoplasms on eval left hydro late 2023. Pre-op BX favors [name]. Some left hydro with parenchymal loss. 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 Now s/p removal both ovaries. Renogram 2024 with Rt 88%, Lt 12% relative function. Cr 1.4 ag baseline.   Post-OP Surveillance:  11/2022 - CMP, CT - No recurrence Rt side, Stable Lt side, ? mons area mass; 12/2022 OR Rt stent removal / Lt stent exchange  07/2023 - OR Lt stent exchange 6/24 contour   2 - STage 3 REnal Insuficiency - Cr 1.4's, GFR 50 at baseline. Seom L>R obstruction as per above.   PMH sig for obesity, open infraumbilical midline hyst for large fibroids, HTN. No ischemic CV disesae / blood thinners. 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 Her PCP is Anton Blas MD.   Today  [place]  is seen to proceed with cysto, retrogrades, possible left stent removal v. Exchange. NO interval fevers. Most recent UA without infectious parameters. 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   CKD (chronic kidney disease), stage III (HCC)    Congenital neutropenia (HCC)    followed by pcp;  evaluated by hematology--- dr federico (note in epic 01-22-2022 benign , ethnic   Ectatic abdominal aorta 01/2015   rpt US  5 yrs   Gallbladder polyp 01/2015   6mm by US    Hepatic steatosis 01/2015   by US    History of excision of mass 06/2012   followedd by general surgeon--- dr gerkin;  02/  2024  s/p  excision soft tissue mass from pubis and lower abdominal wall area--- dermatofibrosarcoma protuberrans   History of uterine fibroid    Hydronephrosis, left    urologist--- dr alvaro;   treated w/ ureteral stent   Hypertension    Left thyroid  nodule    followed by pcp;   last ultrasound in epic 01-15-2022,  per pcp note pt refuses bx   Seasonal allergies    Ureteral mass 07/2022   bilateral;   followed by urologist-- dr alvaro;   s/p  resection partial resection periureteral mass right side, attempted left side,  left ureteral mass remains   Wears glasses     Past Surgical History:  Procedure Laterality Date   COLONOSCOPY WITH PROPOFOL   10/13/2019   dr beavers   CYSTOSCOPY W/ URETERAL STENT PLACEMENT Bilateral 07/17/2022   Procedure: CYSTOSCOPY WITH RETROGRADE PYELOGRAM/URETERAL STENT PLACEMENT;  Surgeon: Alvaro Hummer, MD;  Location: [facility];  Service: Urology;  Laterality: Bilateral;   CYSTOSCOPY W/ URETERAL STENT PLACEMENT Bilateral 12/18/2022   Procedure: CYSTOSCOPY WITH BILATERAL RETROGRADE PYELOGRAM/RIGHT URETERAL STENT REMOVAL AND LEFT URETERAL STENT EXCHANGE;  Surgeon: Alvaro Hummer KATHEE Mickey., MD;  Location[facility];  Service: Urology;  Laterality: Bilateral;   CYSTOSCOPY WITH RETROGRADE PYELOGRAM, URETEROSCOPY AND STENT PLACEMENT Bilateral 04/20/2022   Procedure: CYSTOSCOPY WITH BILATERAL RETROGRADE PYELOGRAM, LEFT URETEROSCOPY AND STENT PLACEMENT;  Surgeon: Carolee Sherwood JONETTA DOUGLAS, MD;  Location: [facility];  Service: Urology;  Laterality: Bilateral;  1 HR FOR CASE   CYSTOSCOPY/URETEROSCOPY/HOLMIUM LASER Left 07/07/2023   Procedure: CYSTOSCOPY/BILATERAL RETROGRADE PYELOGRAM/LEFT STENT EXCHANGE;  Surgeon: Alvaro Ricardo KATHEE Mickey., MD;  Location: [facility];  Service: Urology;  Laterality: Left;  45 MINUTES NEEDED FOR CASE   HYSTERECTOMY ABDOMINAL WITH SALPINGECTOMY Bilateral 07/22/2017   Procedure: HYSTERECTOMY ABDOMINAL WITH SALPINGECTOMY;  Surgeon:  Johnnye Ade, MD;  Location: [facility];  Service: Gynecology;  Laterality: Bilateral;   HYSTEROSCOPY WITH THERMACHOICE  01/28/2007   @[facility]  by dr johnnye;  D&C Hysteroscopy w/ polypectomy/ Thermachoice endometrial ablation   OOPHORECTOMY Right 07/22/2017   Procedure: OOPHORECTOMY;  Surgeon: Johnnye Ade, MD;  Location: [facility];  Service: Gynecology;  Laterality: Right;   SOFT TISSUE MASS EXCISION  06/10/2012   @[facility]  by dr gerkin;  pubis and  lower abdomen wall area  (dermatofibrosarcoma)   TUBAL LIGATION Bilateral 1997   XI ROBOTIC ASSISTED OOPHORECTOMY Left 07/17/2022   Procedure: XI ROBOTIC ASSISTED LEFT OOPHORECTOMY;  Surgeon: Alvaro Ricardo, MD;  Location: [facility];  Service: Urology;  Laterality: Left;    Family History  Problem Relation Age of Onset   Diabetes Mother    Hypertension Mother    Diabetes Father    Hypertension Father    Cancer Paternal Aunt 36       breast   Stroke Neg Hx    Coronary artery disease Neg Hx    Colon cancer Neg Hx    Esophageal cancer Neg Hx    Rectal cancer Neg Hx    Stomach cancer Neg Hx    Social History:  reports that she has never smoked. She has never used smokeless tobacco. She reports that she does not drink alcohol and does not use drugs.  Allergies: No Known Allergies  No medications prior to admission.    No results found for this or any previous visit (from the past 48 hours). No results found.  Review of Systems  Constitutional:  Negative for chills and fever.  All other systems reviewed and are negative.   Last menstrual period 06/25/2017. Physical Exam Vitals reviewed.  HENT:     Head: Normocephalic.     Nose: Nose normal.  Eyes:     Pupils: Pupils are equal, round, and reactive to light.  Cardiovascular:     Rate and Rhythm: Normal rate.  Abdominal:     General: Abdomen is flat.     Comments: Prior scars w/o large hernias.   Genitourinary:    Comments: No CVAT at present Musculoskeletal:        General: Normal  range of motion.     Cervical back: Normal range of motion.  Skin:    General: Skin is warm.  Neurological:     General: No focal deficit present.     Mental Status: She is alert.  Psychiatric:        Mood and Affect: Mood normal.      Assessment/Plan  Proceed as planned with cysto, BILATERAL retrogrades and LEFT ureteral stent removal v. Exchange. Risks, benefits, alternatives, expected peri-op coursed discussed previously and reiterated today.   Ricardo KATHEE Alvaro Mickey., MD 04/12/2024, 6:11 AM

## 2024-04-12 NOTE — Brief Op Note (Signed)
 04/12/2024  3:47 PM  PATIENT:  [name]  58 y.o. female  PRE-OPERATIVE DIAGNOSIS:  LEFT URETERAL OBSTRUCTION  POST-OPERATIVE DIAGNOSIS:  LEFT URETERAL OBSTRUCTION  PROCEDURE:  Procedure(s): CYSTOSCOPY, WITH RETROGRADE PYELOGRAM, STENT REMOVAL (Left) DIAGNOSTIC URETEROSCOPY (Left)  SURGEON:  Surgeons and Role:    * Manny, Ricardo KATHEE Raddle., MD - Primary  PHYSICIAN ASSISTANT:   ASSISTANTS: none   ANESTHESIA:   general  EBL:  minimal   BLOOD ADMINISTERED:none  DRAINS: none   LOCAL MEDICATIONS USED:  NONE  SPECIMEN:  Source of Specimen:  left ureteral stent  DISPOSITION OF SPECIMEN:  discard  COUNTS:  YES  TOURNIQUET:  * No tourniquets in log *  DICTATION: .Other Dictation: Dictation Number [id number]  PLAN OF CARE: Discharge to home after PACU  PATIENT DISPOSITION:  PACU - hemodynamically stable.   Delay start of Pharmacological VTE agent (>24hrs) due to surgical blood loss or risk of bleeding: yes

## 2024-04-13 ENCOUNTER — Encounter (HOSPITAL_COMMUNITY): Payer: Self-pay | Admitting: Urology

## 2024-04-14 NOTE — Anesthesia Postprocedure Evaluation (Signed)
 Anesthesia Post Note  Patient: [name]  Procedure(s) Performed: CYSTOSCOPY, WITH RETROGRADE PYELOGRAM, STENT REMOVAL (Left) DIAGNOSTIC URETEROSCOPY (Left)     Patient location during evaluation: PACU Anesthesia Type: General Level of consciousness: awake and alert Pain management: pain level controlled Vital Signs Assessment: post-procedure vital signs reviewed and stable Respiratory status: spontaneous breathing, nonlabored ventilation, respiratory function stable and patient connected to nasal cannula oxygen Cardiovascular status: blood pressure returned to baseline and stable Postop Assessment: no apparent nausea or vomiting Anesthetic complications: no   No notable events documented.  Last Vitals:  Vitals:   04/12/24 1645 04/12/24 1706  BP: (!) 147/87 (!) 156/88  Pulse: 78 81  Resp: 16 16  Temp:    SpO2: 90% 93%    Last Pain:  Vitals:   04/12/24 1706  TempSrc:   PainSc: 0-No pain   Pain Goal: Patients Stated Pain Goal: 5 (04/12/24 1215)                 Garnette DELENA Gab

## 2025-02-09 ENCOUNTER — Other Ambulatory Visit

## 2025-02-16 ENCOUNTER — Encounter: Admitting: Family Medicine
